# Patient Record
Sex: Male | Born: 1960 | Race: Black or African American | Hispanic: No | Smoking: Current some day smoker
Health system: Southern US, Community
[De-identification: ages and names within clinical notes are randomized; demographics above are authoritative.]

## PROBLEM LIST (undated history)

## (undated) DIAGNOSIS — I509 Heart failure, unspecified: Secondary | ICD-10-CM

## (undated) DIAGNOSIS — I420 Dilated cardiomyopathy: Secondary | ICD-10-CM

## (undated) DIAGNOSIS — G459 Transient cerebral ischemic attack, unspecified: Secondary | ICD-10-CM

---

## 1997-01-28 HISTORY — PX: CARDIAC CATHETERIZATION: SHX172

## 1997-10-01 ENCOUNTER — Inpatient Hospital Stay (HOSPITAL_COMMUNITY): Admission: EM | Admit: 1997-10-01 | Discharge: 1997-10-11 | Payer: Self-pay | Admitting: Emergency Medicine

## 1997-10-01 ENCOUNTER — Encounter: Payer: Self-pay | Admitting: *Deleted

## 1997-10-06 ENCOUNTER — Encounter: Payer: Self-pay | Admitting: Critical Care Medicine

## 1997-10-07 ENCOUNTER — Encounter: Payer: Self-pay | Admitting: Emergency Medicine

## 1998-01-04 ENCOUNTER — Emergency Department (HOSPITAL_COMMUNITY): Admission: EM | Admit: 1998-01-04 | Discharge: 1998-01-04 | Payer: Self-pay | Admitting: Emergency Medicine

## 1998-01-04 ENCOUNTER — Encounter: Payer: Self-pay | Admitting: Emergency Medicine

## 1999-09-06 ENCOUNTER — Emergency Department (HOSPITAL_COMMUNITY): Admission: EM | Admit: 1999-09-06 | Discharge: 1999-09-06 | Payer: Self-pay | Admitting: Emergency Medicine

## 2002-03-30 ENCOUNTER — Emergency Department (HOSPITAL_COMMUNITY): Admission: EM | Admit: 2002-03-30 | Discharge: 2002-03-30 | Payer: Self-pay

## 2002-07-14 ENCOUNTER — Ambulatory Visit (HOSPITAL_COMMUNITY): Admission: RE | Admit: 2002-07-14 | Discharge: 2002-07-14 | Payer: Self-pay | Admitting: Cardiology

## 2002-07-14 ENCOUNTER — Encounter (INDEPENDENT_AMBULATORY_CARE_PROVIDER_SITE_OTHER): Payer: Self-pay | Admitting: Cardiology

## 2003-02-10 ENCOUNTER — Encounter: Admission: RE | Admit: 2003-02-10 | Discharge: 2003-02-10 | Payer: Self-pay | Admitting: Cardiology

## 2008-10-19 ENCOUNTER — Ambulatory Visit: Payer: Self-pay | Admitting: Cardiology

## 2008-10-19 ENCOUNTER — Ambulatory Visit (HOSPITAL_COMMUNITY): Admission: RE | Admit: 2008-10-19 | Discharge: 2008-10-19 | Payer: Self-pay | Admitting: Cardiology

## 2010-02-19 ENCOUNTER — Encounter: Payer: Self-pay | Admitting: Cardiology

## 2013-09-06 ENCOUNTER — Encounter (HOSPITAL_COMMUNITY): Payer: Self-pay | Admitting: Emergency Medicine

## 2013-09-06 ENCOUNTER — Emergency Department (HOSPITAL_COMMUNITY): Payer: Medicare Other

## 2013-09-06 ENCOUNTER — Inpatient Hospital Stay (HOSPITAL_COMMUNITY)
Admission: EM | Admit: 2013-09-06 | Discharge: 2013-09-17 | DRG: 853 | Disposition: A | Discharge status: Home / Self Care | Payer: Medicare Other | Attending: Internal Medicine | Admitting: Internal Medicine

## 2013-09-06 DIAGNOSIS — J869 Pyothorax without fistula: Secondary | ICD-10-CM | POA: Diagnosis present

## 2013-09-06 DIAGNOSIS — N170 Acute kidney failure with tubular necrosis: Secondary | ICD-10-CM | POA: Diagnosis not present

## 2013-09-06 DIAGNOSIS — I498 Other specified cardiac arrhythmias: Secondary | ICD-10-CM | POA: Diagnosis not present

## 2013-09-06 DIAGNOSIS — R7401 Elevation of levels of liver transaminase levels: Secondary | ICD-10-CM | POA: Diagnosis not present

## 2013-09-06 DIAGNOSIS — J852 Abscess of lung without pneumonia: Secondary | ICD-10-CM

## 2013-09-06 DIAGNOSIS — A419 Sepsis, unspecified organism: Secondary | ICD-10-CM | POA: Diagnosis not present

## 2013-09-06 DIAGNOSIS — E876 Hypokalemia: Secondary | ICD-10-CM | POA: Diagnosis present

## 2013-09-06 DIAGNOSIS — Z8673 Personal history of transient ischemic attack (TIA), and cerebral infarction without residual deficits: Secondary | ICD-10-CM

## 2013-09-06 DIAGNOSIS — Z79899 Other long term (current) drug therapy: Secondary | ICD-10-CM

## 2013-09-06 DIAGNOSIS — F17200 Nicotine dependence, unspecified, uncomplicated: Secondary | ICD-10-CM | POA: Diagnosis present

## 2013-09-06 DIAGNOSIS — R7402 Elevation of levels of lactic acid dehydrogenase (LDH): Secondary | ICD-10-CM | POA: Diagnosis not present

## 2013-09-06 DIAGNOSIS — R6520 Severe sepsis without septic shock: Secondary | ICD-10-CM

## 2013-09-06 DIAGNOSIS — E875 Hyperkalemia: Secondary | ICD-10-CM | POA: Diagnosis not present

## 2013-09-06 DIAGNOSIS — I509 Heart failure, unspecified: Secondary | ICD-10-CM | POA: Diagnosis present

## 2013-09-06 DIAGNOSIS — I428 Other cardiomyopathies: Secondary | ICD-10-CM | POA: Diagnosis present

## 2013-09-06 DIAGNOSIS — R740 Nonspecific elevation of levels of transaminase and lactic acid dehydrogenase [LDH]: Secondary | ICD-10-CM

## 2013-09-06 DIAGNOSIS — R6883 Chills (without fever): Secondary | ICD-10-CM | POA: Diagnosis not present

## 2013-09-06 DIAGNOSIS — D473 Essential (hemorrhagic) thrombocythemia: Secondary | ICD-10-CM | POA: Diagnosis not present

## 2013-09-06 DIAGNOSIS — K5900 Constipation, unspecified: Secondary | ICD-10-CM | POA: Diagnosis not present

## 2013-09-06 DIAGNOSIS — I5040 Unspecified combined systolic (congestive) and diastolic (congestive) heart failure: Secondary | ICD-10-CM | POA: Diagnosis present

## 2013-09-06 DIAGNOSIS — I119 Hypertensive heart disease without heart failure: Secondary | ICD-10-CM

## 2013-09-06 DIAGNOSIS — I110 Hypertensive heart disease with heart failure: Secondary | ICD-10-CM | POA: Diagnosis present

## 2013-09-06 DIAGNOSIS — R6521 Severe sepsis with septic shock: Secondary | ICD-10-CM

## 2013-09-06 DIAGNOSIS — I420 Dilated cardiomyopathy: Secondary | ICD-10-CM | POA: Diagnosis present

## 2013-09-06 DIAGNOSIS — B954 Other streptococcus as the cause of diseases classified elsewhere: Secondary | ICD-10-CM | POA: Diagnosis present

## 2013-09-06 DIAGNOSIS — K922 Gastrointestinal hemorrhage, unspecified: Secondary | ICD-10-CM

## 2013-09-06 DIAGNOSIS — I5042 Chronic combined systolic (congestive) and diastolic (congestive) heart failure: Secondary | ICD-10-CM

## 2013-09-06 DIAGNOSIS — R6510 Systemic inflammatory response syndrome (SIRS) of non-infectious origin without acute organ dysfunction: Secondary | ICD-10-CM

## 2013-09-06 DIAGNOSIS — I10 Essential (primary) hypertension: Secondary | ICD-10-CM

## 2013-09-06 DIAGNOSIS — K921 Melena: Secondary | ICD-10-CM | POA: Diagnosis not present

## 2013-09-06 DIAGNOSIS — J9600 Acute respiratory failure, unspecified whether with hypoxia or hypercapnia: Secondary | ICD-10-CM | POA: Diagnosis present

## 2013-09-06 DIAGNOSIS — J9601 Acute respiratory failure with hypoxia: Secondary | ICD-10-CM

## 2013-09-06 HISTORY — DX: Heart failure, unspecified: I50.9

## 2013-09-06 HISTORY — DX: Transient cerebral ischemic attack, unspecified: G45.9

## 2013-09-06 HISTORY — DX: Dilated cardiomyopathy: I42.0

## 2013-09-06 LAB — CBC
HCT: 31.4 % — ABNORMAL LOW (ref 39.0–52.0)
HEMOGLOBIN: 11 g/dL — AB (ref 13.0–17.0)
MCH: 33.4 pg (ref 26.0–34.0)
MCHC: 35 g/dL (ref 30.0–36.0)
MCV: 95.4 fL (ref 78.0–100.0)
Platelets: 458 10*3/uL — ABNORMAL HIGH (ref 150–400)
RBC: 3.29 MIL/uL — AB (ref 4.22–5.81)
RDW: 13.8 % (ref 11.5–15.5)
WBC: 33.2 10*3/uL — ABNORMAL HIGH (ref 4.0–10.5)

## 2013-09-06 LAB — BASIC METABOLIC PANEL
Anion gap: 18 — ABNORMAL HIGH (ref 5–15)
BUN: 10 mg/dL (ref 6–23)
CALCIUM: 8.4 mg/dL (ref 8.4–10.5)
CO2: 21 meq/L (ref 19–32)
CREATININE: 0.92 mg/dL (ref 0.50–1.35)
Chloride: 98 mEq/L (ref 96–112)
GFR calc Af Amer: >90 mL/min (ref >90)
GFR calc non Af Amer: >90 mL/min (ref >90)
GLUCOSE: 118 mg/dL — AB (ref 70–99)
Potassium: 3.3 mEq/L — ABNORMAL LOW (ref 3.7–5.3)
Sodium: 137 mEq/L (ref 137–147)

## 2013-09-06 LAB — I-STAT TROPONIN, ED: Troponin i, poc: 0 ng/mL (ref 0.00–0.08)

## 2013-09-06 LAB — HEPATIC FUNCTION PANEL
ALBUMIN: 2.2 g/dL — AB (ref 3.5–5.2)
ALK PHOS: 139 U/L — AB (ref 39–117)
ALT: 28 U/L (ref 0–53)
AST: 61 U/L — AB (ref 0–37)
Bilirubin, Direct: 0.4 mg/dL — ABNORMAL HIGH (ref 0.0–0.3)
Indirect Bilirubin: 0.8 mg/dL (ref 0.3–0.9)
TOTAL PROTEIN: 7.3 g/dL (ref 6.0–8.3)
Total Bilirubin: 1.2 mg/dL (ref 0.3–1.2)

## 2013-09-06 LAB — PRO B NATRIURETIC PEPTIDE: Pro B Natriuretic peptide (BNP): 982.1 pg/mL — ABNORMAL HIGH (ref 0–125)

## 2013-09-06 LAB — I-STAT CG4 LACTIC ACID, ED: Lactic Acid, Venous: 1.82 mmol/L (ref 0.5–2.2)

## 2013-09-06 MED ORDER — IOHEXOL 350 MG/ML SOLN
80.0000 mL | Freq: Once | INTRAVENOUS | Status: AC | PRN
  Administered 2013-09-06: 80 mL via INTRAVENOUS

## 2013-09-06 MED ORDER — SODIUM CHLORIDE 0.9 % IV SOLN
1000.0000 mL | INTRAVENOUS | Status: DC
  Administered 2013-09-06 (×2): 1000 mL via INTRAVENOUS

## 2013-09-06 MED ORDER — ALBUTEROL SULFATE (2.5 MG/3ML) 0.083% IN NEBU
5.0000 mg | INHALATION_SOLUTION | Freq: Once | RESPIRATORY_TRACT | Status: AC
  Administered 2013-09-06: 5 mg via RESPIRATORY_TRACT
  Filled 2013-09-06: qty 6

## 2013-09-06 MED ORDER — SODIUM CHLORIDE 0.9 % IV BOLUS (SEPSIS)
30.0000 mL/kg | Freq: Once | INTRAVENOUS | Status: AC
  Administered 2013-09-06: 1000 mL via INTRAVENOUS

## 2013-09-06 MED ORDER — DEXTROSE 5 % IV SOLN
1.0000 g | Freq: Once | INTRAVENOUS | Status: AC
  Administered 2013-09-06: 1 g via INTRAVENOUS
  Filled 2013-09-06: qty 10

## 2013-09-06 MED ORDER — DEXTROSE 5 % IV SOLN
500.0000 mg | Freq: Once | INTRAVENOUS | Status: AC
  Administered 2013-09-06: 500 mg via INTRAVENOUS
  Filled 2013-09-06: qty 500

## 2013-09-06 MED ORDER — DEXTROSE 5 % IV SOLN: 500.0000 mg | INTRAVENOUS | Status: DC

## 2013-09-06 MED ORDER — DEXTROSE 5 % IV SOLN: 1.0000 g | INTRAVENOUS | Status: DC

## 2013-09-06 NOTE — ED Notes (Signed)
Attempted IV x2. Unable to gain access.

## 2013-09-06 NOTE — ED Notes (Signed)
Eliezer Lofts, RN at the bedside attempting to place IV.

## 2013-09-06 NOTE — ED Provider Notes (Signed)
Date: 09/06/2013  Rate: 120  Rhythm: sinus tachycardia  QRS Axis: normal  Intervals: normal  ST/T Wave abnormalities: nonspecific T wave changes  Conduction Disutrbances:none  Narrative Interpretation:   Old EKG Reviewed: none available   Threasa Beards, MD 09/06/13 2243

## 2013-09-06 NOTE — ED Notes (Signed)
IV and Phlebotomy Team at the bedside.

## 2013-09-06 NOTE — ED Notes (Signed)
PA at the bedside.

## 2013-09-06 NOTE — ED Notes (Signed)
Patient returned from CT

## 2013-09-06 NOTE — ED Notes (Signed)
Jenna Attempted Iv x2. Unable to access. IV Team paged.

## 2013-09-06 NOTE — ED Provider Notes (Signed)
CSN: [id number]     Arrival date & time 09/06/13  1949 History   First MD Initiated Contact with Patient 09/06/13 2013     Chief Complaint  Patient presents with  . Chest Pain  . Shortness of Breath     (Consider location/radiation/quality/duration/timing/severity/associated sxs/prior Treatment) The history is provided by the patient.    Patient presents with SOB, hemoptysis, right sided chest pain.  States he was carrying a heavy car battery last week and the next morning developed muscle spasms in his right chest and right abdomen with associated SOB.  Has occasional cough productive of blood and mucus. Had cold shaking chills once last week.  Denies recent illness, recent antibiotics, recent immobilization, leg swelling.  He does not smoke.  No hx lung disease.  Denies known fevers, N/V/D, leg swelling.  Has abdominal pain/tenderness only with coughing.   Past Medical History  Diagnosis Date  . TIA (transient ischemic attack)    History reviewed. No pertinent past surgical history. Family History  Problem Relation Age of Onset  . Dementia Mother   . Diabetes Brother    History  Substance Use Topics  . Smoking status: Current Some Day Smoker -- 0.10 packs/day    Types: Cigarettes  . Smokeless tobacco: Never Used  . Alcohol Use: No    Review of Systems  All other systems reviewed and are negative.     Allergies  Review of patient's allergies indicates no known allergies.  Home Medications   Prior to Admission medications   Not on File   BP 107/59  Pulse 121  Temp(Src) 99.9 F (37.7 C) (Oral)  Resp 34  Ht 6' (1.829 m)  Wt 215 lb (97.523 kg)  BMI 29.15 kg/m2  SpO2 95% Physical Exam  Nursing note and vitals reviewed. Constitutional: He appears well-developed and well-nourished. No distress.  HENT:  Head: Normocephalic and atraumatic.  Neck: Neck supple.  Cardiovascular: Normal rate and regular rhythm.   Pulmonary/Chest: Accessory muscle usage present.  Tachypnea noted. No respiratory distress. He has decreased breath sounds in the right lower field. He has no wheezes. He has no rhonchi. He has no rales.  Abdominal: Soft. He exhibits no distension. There is tenderness. There is no rebound and no guarding.  Musculoskeletal: He exhibits no edema.  Neurological: He is alert. He exhibits normal muscle tone.  Skin: He is not diaphoretic.    ED Course  Procedures (including critical care time) Labs Review Labs Reviewed  CBC - Abnormal; Notable for the following:    WBC 33.2 (*)    RBC 3.29 (*)    Hemoglobin 11.0 (*)    HCT 31.4 (*)    Platelets 458 (*)    All other components within normal limits  BASIC METABOLIC PANEL - Abnormal; Notable for the following:    Potassium 3.3 (*)    Glucose, Bld 118 (*)    Anion gap 18 (*)    All other components within normal limits  PRO B NATRIURETIC PEPTIDE - Abnormal; Notable for the following:    Pro B Natriuretic peptide (BNP) 982.1 (*)    All other components within normal limits  HEPATIC FUNCTION PANEL - Abnormal; Notable for the following:    Albumin 2.2 (*)    AST 61 (*)    Alkaline Phosphatase 139 (*)    Bilirubin, Direct 0.4 (*)    All other components within normal limits  CULTURE, BLOOD (ROUTINE X 2)  CULTURE, BLOOD (ROUTINE X 2)  URINE CULTURE  URINALYSIS, ROUTINE W REFLEX MICROSCOPIC  I-STAT TROPOININ, ED  I-STAT CG4 LACTIC ACID, ED    Imaging Review Ct Angio Chest Pe W/cm &/or Wo Cm  09/06/2013   CLINICAL DATA:  Chest pain after lifting injury.  New onset cough.  EXAM: CT ANGIOGRAPHY CHEST WITH CONTRAST  TECHNIQUE: Multidetector CT imaging of the chest was performed using the standard protocol during bolus administration of intravenous contrast. Multiplanar CT image reconstructions and MIPs were obtained to evaluate the vascular anatomy.  CONTRAST:  57mL OMNIPAQUE IOHEXOL 350 MG/ML SOLN  COMPARISON:  Chest radiograph September 06, 2013  FINDINGS: Adequate pulmonary arterial contrast  opacification. No pulmonary arterial filling defects to the level of the subsegmental branches.  At least 11 x 12 cm right lower lobe air-fluid level, with thick rind of low-density presumed necrotic tissue, appearing extrapleural with enhancing atelectasis anteriorly, surrounding ground-glass nodules and loculated component. Low-density component along the anterior pleural surface of the right upper lobe. Small left pleural effusion.  12 mm short axis pretracheal, 14 mm short axis subcarinal lymph node. Inflammatory changes of the cardiophrenic angle with probable lymphadenopathy. Tracheobronchial tree is patent and midline. No pneumothorax.  Right lung process mildly displaces the heart to the left. Pericardium is nonsuspicious. Thoracic aorta is normal in course and caliber with mild calcific atherosclerosis.  Included view of the abdomen is unremarkable. Soft tissues are nonsuspicious. No acute osseous process.  Review of the MIP images confirms the above findings.  IMPRESSION: No acute pulmonary embolism.  At least 11 x 12 mm right lung base probable abscess with air-fluid level (which can also be seen with bronchopleural fistula), in addition to large right loculated pleural effusion/empyema. Associated ground-glass opacities are probably infectious. Mediastinal lymphadenopathy is likely reactive. Right lung process results in mild mediastinal shift to the left.  Small left pleural effusion.  Findings discussed with and reconfirmed by Dr.Ivaan Liddy on8/10/2015at11:09 pm.   Electronically Signed   By: Elon Alas   On: 09/06/2013 23:13   Dg Chest Port 1 View  09/06/2013   CLINICAL DATA:  53 year old male with shortness of breath and respiratory distress  EXAM: PORTABLE CHEST - 1 VIEW  COMPARISON:  01/04/1998 chest radiograph  FINDINGS: Pulmonary vascular congestion is noted.  Opacities overlying both lower lungs, right greater than left, probably represents effusion and atelectasis/airspace disease.  A  3 x 4 cm oval structure overlying the right lower lung could represent a focal mass.  No acute bony abnormalities are noted.  There is no evidence of pneumothorax.  IMPRESSION: Bilateral lower lung opacities, right greater than left, likely representing pleural effusions and atelectasis/airspace disease.  3 x 4 cm oval structure overlying the right lower lung could represent focal mass. Radiographic follow-up recommended.  Pulmonary vascular congestion.   Electronically Signed   By: Hassan Rowan M.D.   On: 09/06/2013 20:48     EKG Interpretation None      8:32 PM Dr Canary Brim made aware of the patient.    11:21 PM Dr Roxan Hockey will consult.  Requests medicine admission.    11:34 PM Discussed pt with [facility] Dr Ernestina Patches who will see the patient.    Dr Ernestina Patches requests call to Critical Care for admission.    12:02 AM Discussed pt Intensivist Dr Deterding who will see and evaluate the patient in the ED.    CRITICAL CARE Performed by: Clayton Bibles   Total critical care time: 30  Critical care time was exclusive of separately billable procedures and treating  other patients.  Critical care was necessary to treat or prevent imminent or life-threatening deterioration.  Critical care was time spent personally by me on the following activities: development of treatment plan with patient and/or surrogate as well as nursing, discussions with consultants, evaluation of patient's response to treatment, examination of patient, obtaining history from patient or surrogate, ordering and performing treatments and interventions, ordering and review of laboratory studies, ordering and review of radiographic studies, pulse oximetry and re-evaluation of patient's condition.   MDM   Final diagnoses:  Lung abscess  Empyema lung  SIRS (systemic inflammatory response syndrome)  Acute respiratory failure with hypoxia    Pt with temp 99.9 in in respiratory distress presents with SOB, right chest pain,  hemoptysis, found to have decreased lung sounds at right base. ED Sepsis orders placed with abx for CAP.  CXR abnormal.  CT shows lung abscess and empyema.  WBC 33, Hgb 11.  BNP 982, pulmonary vascular congestion present.   IVF, abx per initial sepsis orders for CAP.  Consult to cardiothoracic surgery Dr Roxan Hockey who will consult, requests medication admission.  Placed call to hospitalist Dr Ernestina Patches who saw the patient and was concerned that patient may be too sick for step down.  I have called Intensivist who will see the patient and evaluate.    12:44 AM Critical Care PA has seen patient and has communicated with attending.  They will consult.  Dr Ernestina Patches has placed orders for admission.     [place], PA-C 09/07/13 [phone]

## 2013-09-06 NOTE — ED Notes (Signed)
Per EMS, Patient was removing a car battery about a week ago and the next day he woke up with pain in his right chest radiating down the right arm. Patient states the pain worsens with inspiration, palpation, and coughing. The Chest pain and shortness of breath has become worse over the last week. Patient reports having a cough that started about a week ago as well. Patient is tachypnic with movement, but when he sits still he is able to slow his breathing. Vitals per EMS: 140/68, 120 HR, 96% on RA, 28 RR. EKG Unremarkable.

## 2013-09-06 NOTE — Progress Notes (Signed)
ANTIBIOTIC CONSULT NOTE - FOLLOW UP  Pharmacy Consult for rocephin/azithromycin Indication: CAP  No Known Allergies  Patient Measurements: Height: 6' (182.9 cm) Weight: 215 lb (97.523 kg) IBW/kg (Calculated) : 77.6   Vital Signs: Temp: 99.9 F (37.7 C) (08/10 2000) Temp src: Oral (08/10 2000) BP: 106/58 mmHg (08/10 2030) Pulse Rate: 117 (08/10 2030) Intake/Output from previous day:   Intake/Output from this shift:    Labs: No results found for this basename: WBC, HGB, PLT, LABCREA, CREATININE,  in the last 72 hours CrCl is unknown because no creatinine reading has been taken. No results found for this basename: VANCOTROUGH, VANCOPEAK, VANCORANDOM, GENTTROUGH, GENTPEAK, GENTRANDOM, TOBRATROUGH, TOBRAPEAK, TOBRARND, AMIKACINPEAK, AMIKACINTROU, AMIKACIN,  in the last 72 hours   Microbiology: No results found for this or any previous visit (from the past 720 hour(s)).  Anti-infectives   Start     Dose/Rate Route Frequency Ordered Stop   09/06/13 2045  cefTRIAXone (ROCEPHIN) 1 g in dextrose 5 % 50 mL IVPB     1 g 100 mL/hr over 30 Minutes Intravenous  Once 09/06/13 2038     09/06/13 2045  azithromycin (ZITHROMAX) 500 mg in dextrose 5 % 250 mL IVPB     500 mg 250 mL/hr over 60 Minutes Intravenous  Once 09/06/13 2038        Assessment: 53 yo male with possible CAP (CXR with bilateral lower lung opacities) to start rocephin and azithromycin. First doses have been ordered in the ED.  8/10 rocephin 8/10 azithromycin  8/10 blood 8/10 urine   Plan:  -Rocephin 1gm IV q24h -Azithromycin 500mg  IV q24h -Will follow renal function, cultures and clinical progress  Hildred Laser, Pharm D 09/06/2013 8:58 PM

## 2013-09-07 ENCOUNTER — Encounter (HOSPITAL_COMMUNITY): Payer: Self-pay | Admitting: Certified Registered"

## 2013-09-07 ENCOUNTER — Inpatient Hospital Stay (HOSPITAL_COMMUNITY): Payer: Medicare Other | Admitting: Certified Registered"

## 2013-09-07 ENCOUNTER — Encounter (HOSPITAL_COMMUNITY)
Admission: EM | Disposition: A | Discharge status: Home / Self Care | Payer: Self-pay | Source: Home / Self Care | Attending: Pulmonary Disease

## 2013-09-07 ENCOUNTER — Inpatient Hospital Stay (HOSPITAL_COMMUNITY): Payer: Medicare Other

## 2013-09-07 ENCOUNTER — Encounter (HOSPITAL_COMMUNITY): Payer: Medicare Other | Admitting: Certified Registered"

## 2013-09-07 DIAGNOSIS — I509 Heart failure, unspecified: Secondary | ICD-10-CM

## 2013-09-07 DIAGNOSIS — I428 Other cardiomyopathies: Secondary | ICD-10-CM

## 2013-09-07 DIAGNOSIS — I498 Other specified cardiac arrhythmias: Secondary | ICD-10-CM | POA: Diagnosis not present

## 2013-09-07 DIAGNOSIS — F17200 Nicotine dependence, unspecified, uncomplicated: Secondary | ICD-10-CM | POA: Diagnosis present

## 2013-09-07 DIAGNOSIS — D473 Essential (hemorrhagic) thrombocythemia: Secondary | ICD-10-CM | POA: Diagnosis not present

## 2013-09-07 DIAGNOSIS — J9600 Acute respiratory failure, unspecified whether with hypoxia or hypercapnia: Secondary | ICD-10-CM | POA: Diagnosis present

## 2013-09-07 DIAGNOSIS — I119 Hypertensive heart disease without heart failure: Secondary | ICD-10-CM

## 2013-09-07 DIAGNOSIS — K921 Melena: Secondary | ICD-10-CM | POA: Diagnosis not present

## 2013-09-07 DIAGNOSIS — B954 Other streptococcus as the cause of diseases classified elsewhere: Secondary | ICD-10-CM | POA: Diagnosis present

## 2013-09-07 DIAGNOSIS — I420 Dilated cardiomyopathy: Secondary | ICD-10-CM | POA: Diagnosis present

## 2013-09-07 DIAGNOSIS — Z8673 Personal history of transient ischemic attack (TIA), and cerebral infarction without residual deficits: Secondary | ICD-10-CM | POA: Diagnosis not present

## 2013-09-07 DIAGNOSIS — R6883 Chills (without fever): Secondary | ICD-10-CM | POA: Diagnosis present

## 2013-09-07 DIAGNOSIS — I5040 Unspecified combined systolic (congestive) and diastolic (congestive) heart failure: Secondary | ICD-10-CM | POA: Diagnosis present

## 2013-09-07 DIAGNOSIS — E876 Hypokalemia: Secondary | ICD-10-CM | POA: Diagnosis present

## 2013-09-07 DIAGNOSIS — J869 Pyothorax without fistula: Secondary | ICD-10-CM | POA: Diagnosis present

## 2013-09-07 DIAGNOSIS — N170 Acute kidney failure with tubular necrosis: Secondary | ICD-10-CM | POA: Diagnosis not present

## 2013-09-07 DIAGNOSIS — I110 Hypertensive heart disease with heart failure: Secondary | ICD-10-CM

## 2013-09-07 DIAGNOSIS — R7402 Elevation of levels of lactic acid dehydrogenase (LDH): Secondary | ICD-10-CM | POA: Diagnosis not present

## 2013-09-07 DIAGNOSIS — A419 Sepsis, unspecified organism: Secondary | ICD-10-CM | POA: Diagnosis present

## 2013-09-07 DIAGNOSIS — I517 Cardiomegaly: Secondary | ICD-10-CM

## 2013-09-07 DIAGNOSIS — J852 Abscess of lung without pneumonia: Secondary | ICD-10-CM

## 2013-09-07 DIAGNOSIS — R6521 Severe sepsis with septic shock: Secondary | ICD-10-CM | POA: Diagnosis not present

## 2013-09-07 DIAGNOSIS — R6520 Severe sepsis without septic shock: Secondary | ICD-10-CM | POA: Diagnosis present

## 2013-09-07 DIAGNOSIS — E875 Hyperkalemia: Secondary | ICD-10-CM | POA: Diagnosis not present

## 2013-09-07 DIAGNOSIS — K5900 Constipation, unspecified: Secondary | ICD-10-CM | POA: Diagnosis not present

## 2013-09-07 DIAGNOSIS — Z79899 Other long term (current) drug therapy: Secondary | ICD-10-CM | POA: Diagnosis not present

## 2013-09-07 HISTORY — PX: VIDEO ASSISTED THORACOSCOPY (VATS)/DECORTICATION: SHX6171

## 2013-09-07 LAB — PROTIME-INR
INR: 1.3 (ref 0.00–1.49)
Prothrombin Time: 16.2 seconds — ABNORMAL HIGH (ref 11.6–15.2)

## 2013-09-07 LAB — URINALYSIS, ROUTINE W REFLEX MICROSCOPIC
GLUCOSE, UA: NEGATIVE mg/dL
Hgb urine dipstick: NEGATIVE
Ketones, ur: NEGATIVE mg/dL
LEUKOCYTES UA: NEGATIVE
Nitrite: POSITIVE — AB
PH: 6 (ref 5.0–8.0)
PROTEIN: 30 mg/dL — AB
Urobilinogen, UA: >8 mg/dL — ABNORMAL HIGH (ref 0.0–1.0)

## 2013-09-07 LAB — CBC
HCT: 30.9 % — ABNORMAL LOW (ref 39.0–52.0)
Hemoglobin: 10.7 g/dL — ABNORMAL LOW (ref 13.0–17.0)
MCH: 33.9 pg (ref 26.0–34.0)
MCHC: 34.6 g/dL (ref 30.0–36.0)
MCV: 97.8 fL (ref 78.0–100.0)
PLATELETS: 420 10*3/uL — AB (ref 150–400)
RBC: 3.16 MIL/uL — AB (ref 4.22–5.81)
RDW: 13.9 % (ref 11.5–15.5)
WBC: 31.7 10*3/uL — ABNORMAL HIGH (ref 4.0–10.5)

## 2013-09-07 LAB — URINE MICROSCOPIC-ADD ON

## 2013-09-07 LAB — COMPREHENSIVE METABOLIC PANEL
ALT: 26 U/L (ref 0–53)
ALT: 26 U/L (ref 0–53)
AST: 51 U/L — ABNORMAL HIGH (ref 0–37)
AST: 55 U/L — ABNORMAL HIGH (ref 0–37)
Albumin: 1.9 g/dL — ABNORMAL LOW (ref 3.5–5.2)
Albumin: 1.9 g/dL — ABNORMAL LOW (ref 3.5–5.2)
Alkaline Phosphatase: 118 U/L — ABNORMAL HIGH (ref 39–117)
Alkaline Phosphatase: 118 U/L — ABNORMAL HIGH (ref 39–117)
Anion gap: 14 (ref 5–15)
Anion gap: 15 (ref 5–15)
BUN: 10 mg/dL (ref 6–23)
BUN: 9 mg/dL (ref 6–23)
CO2: 20 mEq/L (ref 19–32)
CO2: 20 meq/L (ref 19–32)
CREATININE: 0.85 mg/dL (ref 0.50–1.35)
Calcium: 7.8 mg/dL — ABNORMAL LOW (ref 8.4–10.5)
Calcium: 8.2 mg/dL — ABNORMAL LOW (ref 8.4–10.5)
Chloride: 102 mEq/L (ref 96–112)
Chloride: 104 mEq/L (ref 96–112)
Creatinine, Ser: 0.9 mg/dL (ref 0.50–1.35)
GFR calc Af Amer: >90 mL/min (ref >90)
GFR calc non Af Amer: >90 mL/min (ref >90)
Glucose, Bld: 155 mg/dL — ABNORMAL HIGH (ref 70–99)
Glucose, Bld: 113 mg/dL — ABNORMAL HIGH (ref 70–99)
Potassium: 3.3 mEq/L — ABNORMAL LOW (ref 3.7–5.3)
Potassium: 3.5 mEq/L — ABNORMAL LOW (ref 3.7–5.3)
Sodium: 136 mEq/L — ABNORMAL LOW (ref 137–147)
Sodium: 139 mEq/L (ref 137–147)
TOTAL PROTEIN: 6.8 g/dL (ref 6.0–8.3)
Total Bilirubin: 0.9 mg/dL (ref 0.3–1.2)
Total Bilirubin: 0.8 mg/dL (ref 0.3–1.2)
Total Protein: 6.5 g/dL (ref 6.0–8.3)

## 2013-09-07 LAB — CBC WITH DIFFERENTIAL/PLATELET
Basophils Absolute: 0 10*3/uL (ref 0.0–0.1)
Basophils Relative: 0 % (ref 0–1)
Eosinophils Absolute: 0 10*3/uL (ref 0.0–0.7)
Eosinophils Relative: 0 % (ref 0–5)
HCT: 28.3 % — ABNORMAL LOW (ref 39.0–52.0)
Hemoglobin: 9.7 g/dL — ABNORMAL LOW (ref 13.0–17.0)
Lymphocytes Relative: 10 % — ABNORMAL LOW (ref 12–46)
Lymphs Abs: 3.3 10*3/uL (ref 0.7–4.0)
MCH: 32.8 pg (ref 26.0–34.0)
MCHC: 34.3 g/dL (ref 30.0–36.0)
MCV: 95.6 fL (ref 78.0–100.0)
Monocytes Absolute: 1.6 10*3/uL — ABNORMAL HIGH (ref 0.1–1.0)
Monocytes Relative: 5 % (ref 3–12)
Neutro Abs: 27.8 10*3/uL — ABNORMAL HIGH (ref 1.7–7.7)
Neutrophils Relative %: 85 % — ABNORMAL HIGH (ref 43–77)
Platelets: 416 10*3/uL — ABNORMAL HIGH (ref 150–400)
RBC: 2.96 MIL/uL — ABNORMAL LOW (ref 4.22–5.81)
RDW: 14 % (ref 11.5–15.5)
WBC: 32.7 10*3/uL — ABNORMAL HIGH (ref 4.0–10.5)

## 2013-09-07 LAB — MRSA PCR SCREENING: MRSA by PCR: NEGATIVE

## 2013-09-07 LAB — APTT: aPTT: 37 seconds (ref 24–37)

## 2013-09-07 LAB — TYPE AND SCREEN
ABO/RH(D): O POS
Antibody Screen: NEGATIVE

## 2013-09-07 LAB — TROPONIN I
Troponin I: <0.3 ng/mL (ref <0.30)
Troponin I: <0.3 ng/mL (ref <0.30)

## 2013-09-07 LAB — ABO/RH: ABO/RH(D): O POS

## 2013-09-07 LAB — MAGNESIUM: Magnesium: 2 mg/dL (ref 1.5–2.5)

## 2013-09-07 SURGERY — VIDEO ASSISTED THORACOSCOPY (VATS)/DECORTICATION
Anesthesia: General | Site: Chest | Laterality: Right

## 2013-09-07 MED ORDER — LIDOCAINE HCL (CARDIAC) 20 MG/ML IV SOLN
INTRAVENOUS | Status: AC
  Filled 2013-09-07: qty 5

## 2013-09-07 MED ORDER — SODIUM CHLORIDE 0.9 % IJ SOLN
INTRAMUSCULAR | Status: AC
  Filled 2013-09-07: qty 10

## 2013-09-07 MED ORDER — IPRATROPIUM BROMIDE 0.02 % IN SOLN: 0.5000 mg | RESPIRATORY_TRACT | Status: DC

## 2013-09-07 MED ORDER — DEXMEDETOMIDINE HCL IN NACL 200 MCG/50ML IV SOLN
INTRAVENOUS | Status: AC
  Filled 2013-09-07: qty 50

## 2013-09-07 MED ORDER — OXYCODONE HCL 5 MG PO TABS: 5.0000 mg | ORAL_TABLET | Freq: Once | ORAL | Status: DC | PRN

## 2013-09-07 MED ORDER — PANTOPRAZOLE SODIUM 40 MG IV SOLR
40.0000 mg | INTRAVENOUS | Status: DC
  Administered 2013-09-07 – 2013-09-11 (×5): 40 mg via INTRAVENOUS
  Filled 2013-09-07 (×8): qty 40

## 2013-09-07 MED ORDER — POTASSIUM CHLORIDE IN NACL 20-0.9 MEQ/L-% IV SOLN
INTRAVENOUS | Status: DC
  Administered 2013-09-07: 1 mL via INTRAVENOUS
  Filled 2013-09-07 (×9): qty 1000

## 2013-09-07 MED ORDER — AMLODIPINE BESYLATE 5 MG PO TABS
5.0000 mg | ORAL_TABLET | Freq: Every day | ORAL | Status: DC
  Filled 2013-09-07: qty 1

## 2013-09-07 MED ORDER — MORPHINE BOLUS VIA INFUSION
2.0000 mg | INTRAVENOUS | Status: DC | PRN
  Filled 2013-09-07: qty 4

## 2013-09-07 MED ORDER — SODIUM CHLORIDE 0.9 % IV BOLUS (SEPSIS)
1000.0000 mL | Freq: Once | INTRAVENOUS | Status: AC
  Administered 2013-09-07: 1000 mL via INTRAVENOUS

## 2013-09-07 MED ORDER — CARVEDILOL 12.5 MG PO TABS
12.5000 mg | ORAL_TABLET | Freq: Two times a day (BID) | ORAL | Status: DC
  Filled 2013-09-07 (×3): qty 1

## 2013-09-07 MED ORDER — MORPHINE SULFATE 4 MG/ML IJ SOLN
4.0000 mg | Freq: Once | INTRAMUSCULAR | Status: AC
  Administered 2013-09-07: 4 mg via INTRAVENOUS

## 2013-09-07 MED ORDER — FENTANYL CITRATE 0.05 MG/ML IJ SOLN
INTRAMUSCULAR | Status: DC | PRN
  Administered 2013-09-07 (×2): 50 ug via INTRAVENOUS
  Administered 2013-09-07: 100 ug via INTRAVENOUS
  Administered 2013-09-07 (×4): 50 ug via INTRAVENOUS

## 2013-09-07 MED ORDER — PROPOFOL 10 MG/ML IV BOLUS
INTRAVENOUS | Status: DC | PRN
  Administered 2013-09-07: 150 mg via INTRAVENOUS
  Administered 2013-09-07: 30 mg via INTRAVENOUS
  Administered 2013-09-07: 20 mg via INTRAVENOUS

## 2013-09-07 MED ORDER — PIPERACILLIN-TAZOBACTAM 3.375 G IVPB
3.3750 g | Freq: Three times a day (TID) | INTRAVENOUS | Status: AC
  Administered 2013-09-07 – 2013-09-16 (×30): 3.375 g via INTRAVENOUS
  Filled 2013-09-07 (×33): qty 50

## 2013-09-07 MED ORDER — DEXMEDETOMIDINE HCL IN NACL 200 MCG/50ML IV SOLN
0.4000 ug/kg/h | INTRAVENOUS | Status: DC
  Administered 2013-09-07 (×2): 0.7 ug/kg/h via INTRAVENOUS
  Administered 2013-09-07: 1.2 ug/kg/h via INTRAVENOUS
  Administered 2013-09-07: 0.6 ug/kg/h via INTRAVENOUS
  Administered 2013-09-08 (×3): 0.7 ug/kg/h via INTRAVENOUS
  Filled 2013-09-07: qty 50
  Filled 2013-09-07 (×2): qty 100
  Filled 2013-09-07 (×2): qty 50

## 2013-09-07 MED ORDER — KCL IN DEXTROSE-NACL 20-5-0.9 MEQ/L-%-% IV SOLN
INTRAVENOUS | Status: DC
  Administered 2013-09-07 – 2013-09-10 (×6): via INTRAVENOUS
  Filled 2013-09-07 (×11): qty 1000

## 2013-09-07 MED ORDER — HYDRALAZINE HCL 25 MG PO TABS
25.0000 mg | ORAL_TABLET | Freq: Two times a day (BID) | ORAL | Status: DC
  Filled 2013-09-07 (×3): qty 1

## 2013-09-07 MED ORDER — HEPARIN SODIUM (PORCINE) 5000 UNIT/ML IJ SOLN
5000.0000 [IU] | Freq: Three times a day (TID) | INTRAMUSCULAR | Status: DC
  Administered 2013-09-07 (×2): 5000 [IU] via SUBCUTANEOUS
  Filled 2013-09-07 (×5): qty 1

## 2013-09-07 MED ORDER — FENTANYL CITRATE 0.05 MG/ML IJ SOLN
INTRAMUSCULAR | Status: AC
  Filled 2013-09-07: qty 5

## 2013-09-07 MED ORDER — ROCURONIUM BROMIDE 50 MG/5ML IV SOLN
INTRAVENOUS | Status: AC
  Filled 2013-09-07: qty 1

## 2013-09-07 MED ORDER — PHENYLEPHRINE 40 MCG/ML (10ML) SYRINGE FOR IV PUSH (FOR BLOOD PRESSURE SUPPORT)
PREFILLED_SYRINGE | INTRAVENOUS | Status: AC
  Filled 2013-09-07: qty 10

## 2013-09-07 MED ORDER — ONDANSETRON HCL 4 MG/2ML IJ SOLN
4.0000 mg | Freq: Four times a day (QID) | INTRAMUSCULAR | Status: DC | PRN
  Administered 2013-09-08: 4 mg via INTRAVENOUS
  Filled 2013-09-07: qty 2

## 2013-09-07 MED ORDER — IPRATROPIUM-ALBUTEROL 0.5-2.5 (3) MG/3ML IN SOLN
3.0000 mL | RESPIRATORY_TRACT | Status: DC
  Administered 2013-09-07 – 2013-09-10 (×17): 3 mL via RESPIRATORY_TRACT
  Filled 2013-09-07 (×18): qty 3

## 2013-09-07 MED ORDER — LACTATED RINGERS IV SOLN
INTRAVENOUS | Status: DC | PRN
  Administered 2013-09-07 (×3): via INTRAVENOUS

## 2013-09-07 MED ORDER — MIDAZOLAM HCL 5 MG/5ML IJ SOLN
INTRAMUSCULAR | Status: DC | PRN
  Administered 2013-09-07 (×6): 1 mg via INTRAVENOUS

## 2013-09-07 MED ORDER — ALBUTEROL SULFATE (2.5 MG/3ML) 0.083% IN NEBU: 2.5000 mg | INHALATION_SOLUTION | RESPIRATORY_TRACT | Status: DC

## 2013-09-07 MED ORDER — MIDAZOLAM HCL 2 MG/2ML IJ SOLN
INTRAMUSCULAR | Status: AC
  Filled 2013-09-07: qty 2

## 2013-09-07 MED ORDER — SODIUM CHLORIDE 0.9 % IJ SOLN
3.0000 mL | Freq: Two times a day (BID) | INTRAMUSCULAR | Status: DC
  Administered 2013-09-07 – 2013-09-11 (×7): 3 mL via INTRAVENOUS
  Administered 2013-09-13: 10 mL via INTRAVENOUS
  Administered 2013-09-15 – 2013-09-16 (×3): 3 mL via INTRAVENOUS

## 2013-09-07 MED ORDER — SODIUM CHLORIDE 0.9 % IV SOLN
INTRAVENOUS | Status: DC
  Administered 2013-09-07: 1 mL via INTRAVENOUS

## 2013-09-07 MED ORDER — ONDANSETRON HCL 4 MG/2ML IJ SOLN
INTRAMUSCULAR | Status: AC
  Filled 2013-09-07: qty 2

## 2013-09-07 MED ORDER — PROPOFOL 10 MG/ML IV BOLUS
INTRAVENOUS | Status: AC
  Filled 2013-09-07: qty 20

## 2013-09-07 MED ORDER — ROCURONIUM BROMIDE 100 MG/10ML IV SOLN
INTRAVENOUS | Status: DC | PRN
Start: 2013-09-07 — End: 2013-09-07
  Administered 2013-09-07: 50 mg via INTRAVENOUS
  Administered 2013-09-07: 30 mg via INTRAVENOUS

## 2013-09-07 MED ORDER — BISACODYL 5 MG PO TBEC
10.0000 mg | DELAYED_RELEASE_TABLET | Freq: Every day | ORAL | Status: DC
  Administered 2013-09-08 – 2013-09-09 (×2): 10 mg via ORAL
  Filled 2013-09-07: qty 2

## 2013-09-07 MED ORDER — SENNOSIDES-DOCUSATE SODIUM 8.6-50 MG PO TABS
1.0000 | ORAL_TABLET | Freq: Every day | ORAL | Status: DC
  Administered 2013-09-08: 1 via ORAL
  Filled 2013-09-07 (×4): qty 1

## 2013-09-07 MED ORDER — VANCOMYCIN HCL IN DEXTROSE 1-5 GM/200ML-% IV SOLN
1000.0000 mg | Freq: Three times a day (TID) | INTRAVENOUS | Status: DC
  Administered 2013-09-07 – 2013-09-08 (×5): 1000 mg via INTRAVENOUS
  Filled 2013-09-07 (×7): qty 200

## 2013-09-07 MED ORDER — CHLORHEXIDINE GLUCONATE 0.12 % MT SOLN
15.0000 mL | Freq: Two times a day (BID) | OROMUCOSAL | Status: DC
  Administered 2013-09-07 – 2013-09-09 (×4): 15 mL via OROMUCOSAL
  Filled 2013-09-07 (×4): qty 15

## 2013-09-07 MED ORDER — MORPHINE SULFATE 4 MG/ML IJ SOLN
INTRAMUSCULAR | Status: AC
  Filled 2013-09-07: qty 1

## 2013-09-07 MED ORDER — PROMETHAZINE HCL 25 MG/ML IJ SOLN: 6.2500 mg | INTRAMUSCULAR | Status: DC | PRN

## 2013-09-07 MED ORDER — VECURONIUM BROMIDE 10 MG IV SOLR
INTRAVENOUS | Status: DC | PRN
  Administered 2013-09-07: 4 mg via INTRAVENOUS
  Administered 2013-09-07: 3 mg via INTRAVENOUS
  Administered 2013-09-07: 2 mg via INTRAVENOUS
  Administered 2013-09-07: 1 mg via INTRAVENOUS

## 2013-09-07 MED ORDER — ENOXAPARIN SODIUM 40 MG/0.4ML ~~LOC~~ SOLN
40.0000 mg | Freq: Every day | SUBCUTANEOUS | Status: DC
  Administered 2013-09-08 – 2013-09-11 (×4): 40 mg via SUBCUTANEOUS
  Filled 2013-09-07 (×5): qty 0.4

## 2013-09-07 MED ORDER — OXYCODONE HCL 5 MG/5ML PO SOLN: 5.0000 mg | Freq: Once | ORAL | Status: DC | PRN

## 2013-09-07 MED ORDER — ARTIFICIAL TEARS OP OINT
TOPICAL_OINTMENT | OPHTHALMIC | Status: DC | PRN
  Administered 2013-09-07: 1 via OPHTHALMIC

## 2013-09-07 MED ORDER — EPHEDRINE SULFATE 50 MG/ML IJ SOLN
INTRAMUSCULAR | Status: AC
  Filled 2013-09-07: qty 1

## 2013-09-07 MED ORDER — NOREPINEPHRINE BITARTRATE 1 MG/ML IV SOLN
2.0000 ug/min | INTRAVENOUS | Status: DC
  Administered 2013-09-07 – 2013-09-08 (×2): 2 ug/min via INTRAVENOUS
  Filled 2013-09-07 (×2): qty 4

## 2013-09-07 MED ORDER — CETYLPYRIDINIUM CHLORIDE 0.05 % MT LIQD
7.0000 mL | Freq: Four times a day (QID) | OROMUCOSAL | Status: DC
  Administered 2013-09-07 – 2013-09-09 (×7): 7 mL via OROMUCOSAL

## 2013-09-07 MED ORDER — MORPHINE SULFATE 4 MG/ML IJ SOLN: 3.0000 mg | INTRAMUSCULAR | Status: DC | PRN

## 2013-09-07 MED ORDER — HYDROMORPHONE HCL PF 1 MG/ML IJ SOLN: 0.2500 mg | INTRAMUSCULAR | Status: DC | PRN

## 2013-09-07 MED ORDER — ARTIFICIAL TEARS OP OINT
TOPICAL_OINTMENT | OPHTHALMIC | Status: AC
  Filled 2013-09-07: qty 3.5

## 2013-09-07 MED ORDER — STERILE WATER FOR INJECTION IJ SOLN
INTRAMUSCULAR | Status: AC
  Filled 2013-09-07: qty 10

## 2013-09-07 MED ORDER — FENTANYL CITRATE 0.05 MG/ML IJ SOLN
25.0000 ug | INTRAMUSCULAR | Status: DC | PRN
  Administered 2013-09-07: 50 ug via INTRAVENOUS
  Administered 2013-09-08 (×2): 100 ug via INTRAVENOUS
  Administered 2013-09-08: 50 ug via INTRAVENOUS
  Administered 2013-09-08: 25 ug via INTRAVENOUS
  Administered 2013-09-08: 75 ug via INTRAVENOUS
  Administered 2013-09-09: 100 ug via INTRAVENOUS
  Administered 2013-09-10 (×2): 50 ug via INTRAVENOUS
  Filled 2013-09-07 (×10): qty 2

## 2013-09-07 MED ORDER — POTASSIUM CHLORIDE 10 MEQ/50ML IV SOLN
10.0000 meq | Freq: Every day | INTRAVENOUS | Status: DC | PRN
  Filled 2013-09-07: qty 50

## 2013-09-07 MED ORDER — ACETAMINOPHEN 160 MG/5ML PO SOLN
1000.0000 mg | Freq: Four times a day (QID) | ORAL | Status: AC
  Filled 2013-09-07: qty 40

## 2013-09-07 MED ORDER — PHENYLEPHRINE HCL 10 MG/ML IJ SOLN
INTRAMUSCULAR | Status: DC | PRN
  Administered 2013-09-07 (×2): 120 ug via INTRAVENOUS
  Administered 2013-09-07: 40 ug via INTRAVENOUS
  Administered 2013-09-07 (×2): 80 ug via INTRAVENOUS
  Administered 2013-09-07: 120 ug via INTRAVENOUS
  Administered 2013-09-07: 160 ug via INTRAVENOUS
  Administered 2013-09-07: 80 ug via INTRAVENOUS

## 2013-09-07 MED ORDER — VANCOMYCIN HCL IN DEXTROSE 1-5 GM/200ML-% IV SOLN
1000.0000 mg | INTRAVENOUS | Status: DC
Start: 2013-09-07 — End: 2013-09-07

## 2013-09-07 MED ORDER — VECURONIUM BROMIDE 10 MG IV SOLR
INTRAVENOUS | Status: AC
  Filled 2013-09-07: qty 10

## 2013-09-07 MED ORDER — DEXTROSE 5 % IV SOLN
10.0000 mg | INTRAVENOUS | Status: DC | PRN
  Administered 2013-09-07: 25 ug/min via INTRAVENOUS

## 2013-09-07 MED ORDER — ACETAMINOPHEN 500 MG PO TABS
1000.0000 mg | ORAL_TABLET | Freq: Four times a day (QID) | ORAL | Status: AC
  Administered 2013-09-08 – 2013-09-12 (×14): 1000 mg via ORAL
  Filled 2013-09-07 (×16): qty 2

## 2013-09-07 MED ORDER — 0.9 % SODIUM CHLORIDE (POUR BTL) OPTIME
TOPICAL | Status: DC | PRN
  Administered 2013-09-07: 2000 mL

## 2013-09-07 MED ORDER — DEXMEDETOMIDINE HCL IN NACL 200 MCG/50ML IV SOLN
INTRAVENOUS | Status: DC | PRN
  Administered 2013-09-07: 0.3 ug/kg/h via INTRAVENOUS

## 2013-09-07 MED ORDER — MORPHINE SULFATE 2 MG/ML IJ SOLN
2.0000 mg | INTRAMUSCULAR | Status: DC | PRN
  Administered 2013-09-07 (×2): 2 mg via INTRAVENOUS
  Filled 2013-09-07 (×4): qty 1

## 2013-09-07 MED ORDER — SUCCINYLCHOLINE CHLORIDE 20 MG/ML IJ SOLN
INTRAMUSCULAR | Status: AC
  Filled 2013-09-07: qty 1

## 2013-09-07 MED ORDER — POTASSIUM CHLORIDE CRYS ER 20 MEQ PO TBCR
40.0000 meq | EXTENDED_RELEASE_TABLET | Freq: Once | ORAL | Status: AC
  Administered 2013-09-07: 40 meq via ORAL
  Filled 2013-09-07: qty 2

## 2013-09-07 MED ORDER — VANCOMYCIN HCL IN DEXTROSE 1-5 GM/200ML-% IV SOLN
1000.0000 mg | Freq: Once | INTRAVENOUS | Status: AC
  Administered 2013-09-07: 1000 mg via INTRAVENOUS
  Filled 2013-09-07: qty 200

## 2013-09-07 MED ORDER — LISINOPRIL 40 MG PO TABS
40.0000 mg | ORAL_TABLET | Freq: Every day | ORAL | Status: DC
  Administered 2013-09-09: 40 mg via ORAL
  Filled 2013-09-07 (×4): qty 1

## 2013-09-07 SURGICAL SUPPLY — 72 items
ADH SKN CLS APL DERMABOND .7 (GAUZE/BANDAGES/DRESSINGS) ×1
APL SRG 22X2 LUM MLBL SLNT (VASCULAR PRODUCTS)
APPLICATOR TIP EXT COSEAL (VASCULAR PRODUCTS) IMPLANT
BAG SPEC RTRVL LRG 6X4 10 (ENDOMECHANICALS)
CANISTER SUCTION 2500CC (MISCELLANEOUS) ×3 IMPLANT
CATH KIT ON Q 5IN SLV (PAIN MANAGEMENT) IMPLANT
CATH THORACIC 28FR (CATHETERS) IMPLANT
CATH THORACIC 28FR RT ANG (CATHETERS) IMPLANT
CATH THORACIC 36FR (CATHETERS) ×2 IMPLANT
CATH THORACIC 36FR RT ANG (CATHETERS) IMPLANT
CLIP TI MEDIUM 6 (CLIP) ×3 IMPLANT
CONN ST 1/4X3/8  BEN (MISCELLANEOUS)
CONN ST 1/4X3/8 BEN (MISCELLANEOUS) IMPLANT
CONN Y 3/8X3/8X3/8  BEN (MISCELLANEOUS)
CONN Y 3/8X3/8X3/8 BEN (MISCELLANEOUS) IMPLANT
CONNECTOR 5 IN 1 STRAIGHT STRL (MISCELLANEOUS) ×4 IMPLANT
CONT SPEC 4OZ CLIKSEAL STRL BL (MISCELLANEOUS) ×6 IMPLANT
COVER SURGICAL LIGHT HANDLE (MISCELLANEOUS) ×4 IMPLANT
DERMABOND ADVANCED (GAUZE/BANDAGES/DRESSINGS) ×2
DERMABOND ADVANCED .7 DNX12 (GAUZE/BANDAGES/DRESSINGS) IMPLANT
DRAIN CHANNEL 32F RND 10.7 FF (WOUND CARE) ×4 IMPLANT
DRAPE LAPAROSCOPIC ABDOMINAL (DRAPES) ×3 IMPLANT
DRAPE WARM FLUID 44X44 (DRAPE) ×2 IMPLANT
ELECT REM PT RETURN 9FT ADLT (ELECTROSURGICAL) ×3
ELECTRODE REM PT RTRN 9FT ADLT (ELECTROSURGICAL) ×1 IMPLANT
GAUZE SPONGE 4X4 12PLY STRL (GAUZE/BANDAGES/DRESSINGS) ×3 IMPLANT
GLOVE SURG SIGNA 7.5 PF LTX (GLOVE) ×6 IMPLANT
GOWN STRL REUS W/ TWL LRG LVL3 (GOWN DISPOSABLE) ×2 IMPLANT
GOWN STRL REUS W/ TWL XL LVL3 (GOWN DISPOSABLE) ×1 IMPLANT
GOWN STRL REUS W/TWL LRG LVL3 (GOWN DISPOSABLE) ×9
GOWN STRL REUS W/TWL XL LVL3 (GOWN DISPOSABLE) ×3
HANDLE STAPLE ENDO GIA SHORT (STAPLE)
HEMOSTAT SURGICEL 2X14 (HEMOSTASIS) IMPLANT
KIT BASIN OR (CUSTOM PROCEDURE TRAY) ×3 IMPLANT
KIT ROOM TURNOVER OR (KITS) ×3 IMPLANT
NS IRRIG 1000ML POUR BTL (IV SOLUTION) ×8 IMPLANT
PACK CHEST (CUSTOM PROCEDURE TRAY) ×3 IMPLANT
PAD ARMBOARD 7.5X6 YLW CONV (MISCELLANEOUS) ×10 IMPLANT
POUCH ENDO CATCH II 15MM (MISCELLANEOUS) IMPLANT
POUCH SPECIMEN RETRIEVAL 10MM (ENDOMECHANICALS) IMPLANT
SEALANT PROGEL (MISCELLANEOUS) IMPLANT
SEALANT SURG COSEAL 4ML (VASCULAR PRODUCTS) IMPLANT
SEALANT SURG COSEAL 8ML (VASCULAR PRODUCTS) IMPLANT
SOLUTION ANTI FOG 6CC (MISCELLANEOUS) ×4 IMPLANT
SPONGE GAUZE 4X4 12PLY STER LF (GAUZE/BANDAGES/DRESSINGS) ×2 IMPLANT
SPONGE INTESTINAL PEANUT (DISPOSABLE) ×10 IMPLANT
SPONGE LAP 18X18 X RAY DECT (DISPOSABLE) ×6 IMPLANT
STAPLER ENDO GIA 12 SHRT THIN (STAPLE) IMPLANT
STAPLER ENDO GIA 12MM SHORT (STAPLE) IMPLANT
SUT PROLENE 4 0 RB 1 (SUTURE)
SUT PROLENE 4-0 RB1 .5 CRCL 36 (SUTURE) IMPLANT
SUT SILK  1 MH (SUTURE) ×4
SUT SILK 1 MH (SUTURE) ×2 IMPLANT
SUT SILK 2 0SH CR/8 30 (SUTURE) IMPLANT
SUT SILK 3 0SH CR/8 30 (SUTURE) IMPLANT
SUT VIC AB 1 CTX 36 (SUTURE) ×3
SUT VIC AB 1 CTX36XBRD ANBCTR (SUTURE) IMPLANT
SUT VIC AB 2-0 CTX 36 (SUTURE) ×2 IMPLANT
SUT VIC AB 3-0 MH 27 (SUTURE) IMPLANT
SUT VIC AB 3-0 X1 27 (SUTURE) ×2 IMPLANT
SUT VICRYL 2 TP 1 (SUTURE) ×2 IMPLANT
SYSTEM SAHARA CHEST DRAIN RE-I (WOUND CARE) ×3 IMPLANT
TAPE CLOTH 4X10 WHT NS (GAUZE/BANDAGES/DRESSINGS) ×3 IMPLANT
TAPE CLOTH SURG 4X10 WHT LF (GAUZE/BANDAGES/DRESSINGS) ×2 IMPLANT
TIP APPLICATOR SPRAY EXTEND 16 (VASCULAR PRODUCTS) IMPLANT
TOWEL OR 17X24 6PK STRL BLUE (TOWEL DISPOSABLE) ×6 IMPLANT
TOWEL OR 17X26 10 PK STRL BLUE (TOWEL DISPOSABLE) ×6 IMPLANT
TRAP SPECIMEN MUCOUS 40CC (MISCELLANEOUS) ×4 IMPLANT
TRAY FOLEY CATH 16FRSI W/METER (SET/KITS/TRAYS/PACK) ×3 IMPLANT
TROCAR XCEL BLADELESS 5X75MML (TROCAR) ×3 IMPLANT
TUNNELER SHEATH ON-Q 11GX8 DSP (PAIN MANAGEMENT) IMPLANT
WATER STERILE IRR 1000ML POUR (IV SOLUTION) ×6 IMPLANT

## 2013-09-07 NOTE — Progress Notes (Signed)
Name: Frank Solomon MRN: [id number] DOB: 06-17-1960    ADMISSION DATE:  09/06/2013 CONSULTATION DATE:  09/06/13  REFERRING MD :  [facility] PRIMARY SERVICE:  [facility]  CHIEF COMPLAINT:  Empyema vs abscess  BRIEF PATIENT DESCRIPTION: 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  Has had mild cough, small amount of hemoptysis occasionally.  In ED, CTA chest demonstrated large right empyema vs effusion and 11x31mm RLL loculation, likely abscess.  Seen by CVTS who requested medical admission.  [facility] requested PCCM consult.  SIGNIFICANT EVENTS:  8/10 - admit 8/11 rt vats  STUDIES: 8/10 CXR >>> b/l opacities, 3x4cm oval structure in RLL 8/10 Chest CTA >>> 11 x 60mm right lung base probable abscess with air fluid level, large right loculated pleural effusion vs empyema.  LINES / TUBES: PIV  CULTURES: Blood 8/10 >>> Sputum 8/10 >>> Urine 8/10 >>>  ANTIBIOTICS: Vanc 8/10 >>> Zosyn 8/10 >>>  HISTORY OF PRESENT ILLNESS:  Frank Solomon is a [name] with PMH of CHF and remote TIA, who presented to [facility] ED 8/10 with SOB and right sided chest discomfort x 1 week, worse over past 2 - 3 days.  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  Since that time, he has had occasional chills and a cough that has mostly been non-productive; however, at times he has noticed a small amount of blood.  He denies any fevers/sweats, wheezing, N/V/D, abdominal pain, myalgias. No recent travel or exposure to known sick contacts. Does report smoking history. In ED, WBC's 33K, HR 100-115, RR 30s during exertion / high teens at rest.  CTA revealed probable right lung abscess and empyema as described above.  CVTS was consulted and they requested medical admission.  [facility] did admit pt and requested PCCM consult.   SUBJECTIVE: Sedated post  vats  VITAL SIGNS: Temp:  [99.8 F (37.7 C)-99.9 F (37.7 C)] 99.8 F (37.7 C) (08/11 0200) Pulse Rate:  [106-133] 110 (08/11 1345) Resp:  [0-43] 12 (08/11 1345) BP: (106-173)/(54-90) 170/90 mmHg (08/11 1340) SpO2:  [95 %-100 %] 95 % (08/11 1345) Arterial Line BP: (116-235)/(59-96) 116/59 mmHg (08/11 1345) FiO2 (%):  [50 %] 50 % (08/11 1325) Weight:  [214 lb 15.2 oz (97.5 kg)-215 lb (97.523 kg)] 214 lb 15.2 oz (97.5 kg) (08/11 0200)  PHYSICAL EXAMINATION: General: WDWN male, sedated on vent Neuro: Sedated HEENT: [place]/AT. PERRL, sclerae anicteric. Cardiovascular: Tachy but regular, no M/R/G.  Lungs: Decreased bs rt. 3 cts rt with 300 cc bloody drainage without air leak   Abdomen: BS x 4, soft, NT/ND.  Musculoskeletal: No gross deformities, no edema.  Skin: Rt vats site dressing c d i.   Recent Labs Lab 09/06/13 2109 09/07/13 0216 09/07/13 0829  NA 137 136* 139  K 3.3* 3.3* 3.5*  CL 98 102 104  CO2 21 20 20   BUN 10 10 9   CREATININE 0.92 0.90 0.85  GLUCOSE 118* 155* 113*    Recent Labs Lab 09/06/13 2109 09/07/13 0216 09/07/13 0829  HGB 11.0* 9.7* 10.7*  HCT 31.4* 28.3* 30.9*  WBC 33.2* 32.7* 31.7*  PLT 458* 416* 420*   Chest 2 View  09/07/2013   CLINICAL DATA:  Preoperative evaluation. Draining abscess. Lung infection.  EXAM: CHEST  2 VIEW  COMPARISON:  Chest radiograph  09/06/2013; chest CT 09/06/2013.  FINDINGS: Stable enlarged cardiac and mediastinal contours with leftward mediastinal shift. Re- demonstrated air-fluid level within the right lower hemithorax compatible with probable large loculated right pleural effusion/empyema. Underlying right lung base consolidative opacities. Small left pleural effusion.  IMPRESSION: Re- demonstrated large right pleural effusion with air-fluid level and underlying consolidative opacities. Opacities are concerning for infection. Large right pleural fluid collection may represent empyema. Gas may be secondary to infection or  bronchopleural fistula.   Electronically Signed   By: Lovey Newcomer M.D.   On: 09/07/2013 09:00   Ct Angio Chest Pe W/cm &/or Wo Cm  09/06/2013   CLINICAL DATA:  Chest pain after lifting injury.  New onset cough.  EXAM: CT ANGIOGRAPHY CHEST WITH CONTRAST  TECHNIQUE: Multidetector CT imaging of the chest was performed using the standard protocol during bolus administration of intravenous contrast. Multiplanar CT image reconstructions and MIPs were obtained to evaluate the vascular anatomy.  CONTRAST:  55mL OMNIPAQUE IOHEXOL 350 MG/ML SOLN  COMPARISON:  Chest radiograph September 06, 2013  FINDINGS: Adequate pulmonary arterial contrast opacification. No pulmonary arterial filling defects to the level of the subsegmental branches.  At least 11 x 12 cm right lower lobe air-fluid level, with thick rind of low-density presumed necrotic tissue, appearing extrapleural with enhancing atelectasis anteriorly, surrounding ground-glass nodules and loculated component. Low-density component along the anterior pleural surface of the right upper lobe. Small left pleural effusion.  12 mm short axis pretracheal, 14 mm short axis subcarinal lymph node. Inflammatory changes of the cardiophrenic angle with probable lymphadenopathy. Tracheobronchial tree is patent and midline. No pneumothorax.  Right lung process mildly displaces the heart to the left. Pericardium is nonsuspicious. Thoracic aorta is normal in course and caliber with mild calcific atherosclerosis.  Included view of the abdomen is unremarkable. Soft tissues are nonsuspicious. No acute osseous process.  Review of the MIP images confirms the above findings.  IMPRESSION: No acute pulmonary embolism.  At least 11 x 12 mm right lung base probable abscess with air-fluid level (which can also be seen with bronchopleural fistula), in addition to large right loculated pleural effusion/empyema. Associated ground-glass opacities are probably infectious. Mediastinal lymphadenopathy is  likely reactive. Right lung process results in mild mediastinal shift to the left.  Small left pleural effusion.  Findings discussed with and reconfirmed by Dr.EMILY WEST on8/10/2015at11:09 pm.   Electronically Signed   By: Elon Alas   On: 09/06/2013 23:13   Dg Chest Port 1 View  09/07/2013   CLINICAL DATA:  Endotracheal and chest tube placement. Video-assisted thoracoscopy with drainage of empyema and decortication, right chest. Postop day 0  EXAM: PORTABLE CHEST - 1 VIEW  COMPARISON:  09/07/2013  FINDINGS: Endotracheal tube tip: 2.7 cm above the carina. Right IJ line tip: SVC.  Multiple right-sided chest tubes are present. Small right basilar pneumothorax peripherally. There is fluid in the minor fissure. Reduced atelectasis at the right lung base, with some underlying interstitial and patchy airspace opacity in the right lung.  Suspected mild atelectasis in the left upper lobe. Mildly enlarged cardiopericardial silhouette. Significantly reduced right pleural effusion. Mildly blunted left lateral costophrenic angle.  IMPRESSION: 1. Interval right empyema drainage and decortication, with small right peripheral basilar pneumothorax, significant decrease in right pleural effusion and right basilar atelectasis, and multiple chest tubes in place. There is some patchy interstitial and airspace opacity in the right lung. 2. Mild atelectasis in the left upper lobe. 3. Mildly enlarged cardiopericardial silhouette. 4. Tubes and lines  appear satisfactorily positioned.   Electronically Signed   By: Sherryl Barters M.D.   On: 09/07/2013 14:13   Dg Chest Port 1 View  09/06/2013   CLINICAL DATA:  54 year old male with shortness of breath and respiratory distress  EXAM: PORTABLE CHEST - 1 VIEW  COMPARISON:  01/04/1998 chest radiograph  FINDINGS: Pulmonary vascular congestion is noted.  Opacities overlying both lower lungs, right greater than left, probably represents effusion and atelectasis/airspace disease.  A 3  x 4 cm oval structure overlying the right lower lung could represent a focal mass.  No acute bony abnormalities are noted.  There is no evidence of pneumothorax.  IMPRESSION: Bilateral lower lung opacities, right greater than left, likely representing pleural effusions and atelectasis/airspace disease.  3 x 4 cm oval structure overlying the right lower lung could represent focal mass. Radiographic follow-up recommended.  Pulmonary vascular congestion.   Electronically Signed   By: Hassan Rowan M.D.   On: 09/06/2013 20:48    ASSESSMENT / PLAN:  Right Empyema vs Effusion Probable Abscess Sepsis - due to above Acute hypoxic respiratory failure Tobacco use disorder Post Vats 8/11 with Rt CT x 3 Recs: ICU and PCCM assume care in ICU. Wean from vent in am. Vent bundle Pain control  abx. DC antihypertensives Vats per CVTS   Richardson Landry Minor ACNP Maryanna Shape PCCM Pager [phone] till 3 pm If no answer page [phone] 09/07/2013, 2:43 PM  Patient seen and examined with NP S. Minor, agree with assess and plan with the following exceptions.  1. Monitor bp closely, seems to opoid sensitive 2. Vent setting, will increase vt and adjust rate, if no further surgical intervention, will plan for extubation in the morning.  3. Pain control - fentanyl for now   CC time: 5mins  Vilinda Boehringer, MD [facility] Pager [phone] On Call Pager [phone]

## 2013-09-07 NOTE — Progress Notes (Signed)
[place] TEAM 1 - Stepdown/ICU TEAM Progress Note  Frank Solomon XNA:[id number] DOB: 10/30/1960 DOA: 09/06/2013 PCP: No primary provider on file.  Admit HPI / Brief Narrative: 53 y.o. year old male with significant past medical history of tobacco abuse, CHF, hx/o TIA presenting with acute resp failure. Pt reports sudden onset of CP and SOB over past 2-3 days. Denies any recent trauma. Has had some mild chills at home. Active smoker, though he reports that he has not smoked since onset of illness. No known sick contacts. Has had some extertional dyspnea and orthopnea. No PND. No LE swelling.  On presentation to the ER. tmax 99.9, HR 100s-120s, RR- 20s-40, SBP 100s-130s. Satting > 95% on 3L. WBC 33.2, hgb 11, K 3.3, Cr 0.92. Trop and lactate WNL. EKG pending. CT angio of the chest shows at least 11x12 cm RLL air fluid level of presumed necrotic tissue in addition to R loculated empyema with infectious ground glass opacities and reactive mediastinal LAD. + mild mediastinal L shift. ED PA discussed case with CT surgery. Recs were for medical admission and consult. Initially started on CAP coverage by EDPA.    HPI/Subjective: 8/11 patient S./P. VATS and per surgery is to remain on vent overnight. Patient is waking up, fighting the vent, signs of pain i.e. SBP high 220s, HR high 170s  Assessment/Plan:  Resp failure/Sepsis  -S/P right VATS -Patient on Precedex drip (at maximum dose) -Patient still on vent and is waking. Orders written for morphine 3-6 mg q 4hr, [facility] M. paged, left orders with patient's RN if [facility] M. was unable to come within the next 10-15 minutes to page me and we would start patient on propofol drip. - continue vanc and zosyn  -pan culture pending   CHF/HTN   -Per [facility] M. -2D ECHO pending resolution of above    Code Status: FULL Family Communication: no family present at time of exam Disposition Plan:Per surgery    Consultants: Dr. Modesto Charon (cardiothoracic  surgery) Dr.Vishal Mungal [facility] M.)  Procedure/Significant Events: 8/11VIDEO ASSISTED THORACOSCOPY (VATS)/ DRAINAGE OF EMPYEMA/ DECORTICATION (Right)    Culture 8/11 pleural fluid pending    Antibiotics: Vancomycin 8/11>> Zosyn 8/11>>    DVT prophylaxis: Lovenox   Devices    LINES / TUBES:  8/10 20ga right forearm  8/11 arterial line 8/11 right IJ double-lumen 8/11 Y chest tube 1/ 36 fr; CT #2/32 fr; CT #3/35fr    Continuous Infusions: . sodium chloride Stopped (09/07/13 0235)  . 0.9 % NaCl with KCl 20 mEq / L 125 mL/hr at 09/07/13 0400  . dexmedetomidine 1.2 mcg/kg/hr (09/07/13 1400)  . dextrose 5 % and 0.9 % NaCl with KCl 20 mEq/L      Objective: VITAL SIGNS: Temp: 99.8 F (37.7 C) (08/11 0200) Temp src: Oral (08/11 0200) BP: 170/90 mmHg (08/11 1340) Pulse Rate: 110 (08/11 1345) SPO2; FIO2:   Intake/Output Summary (Last 24 hours) at 09/07/13 1358 Last data filed at 09/07/13 1330  Gross per 24 hour  Intake 3291.25 ml  Output   1025 ml  Net 2266.25 ml     Exam: General: A./O. x0 intubated and sedated, waking from sedation  Lungs: Tachypnea, Clear to auscultation left lung field; 3 chest tubes present on the right S./P VATS  Cardiovascular: Tachycardic, Regular rhythm without murmur gallop or rub normal S1 and S2 Abdomen: Nontender, nondistended, soft, bowel sounds positive, no rebound, no ascites, no appreciable mass Extremities: No significant cyanosis, clubbing, or edema bilateral lower extremities  Data  Reviewed: Basic Metabolic Panel:  Recent Labs Lab 09/06/13 2109 09/07/13 0216 09/07/13 0829  NA 137 136* 139  K 3.3* 3.3* 3.5*  CL 98 102 104  CO2 21 20 20   GLUCOSE 118* 155* 113*  BUN 10 10 9   CREATININE 0.92 0.90 0.85  CALCIUM 8.4 7.8* 8.2*  MG  --  2.0  --    Liver Function Tests:  Recent Labs Lab 09/06/13 2109 09/07/13 0216 09/07/13 0829  AST 61* 51* 55*  ALT 28 26 26   ALKPHOS 139* 118* 118*  BILITOT 1.2 0.9 0.8    PROT 7.3 6.5 6.8  ALBUMIN 2.2* 1.9* 1.9*   No results found for this basename: LIPASE, AMYLASE,  in the last 168 hours No results found for this basename: AMMONIA,  in the last 168 hours CBC:  Recent Labs Lab 09/06/13 2109 09/07/13 0216 09/07/13 0829  WBC 33.2* 32.7* 31.7*  NEUTROABS  --  27.8*  --   HGB 11.0* 9.7* 10.7*  HCT 31.4* 28.3* 30.9*  MCV 95.4 95.6 97.8  PLT 458* 416* 420*   Cardiac Enzymes:  Recent Labs Lab 09/07/13 0216 09/07/13 0829  TROPONINI <0.30 <0.30   BNP (last 3 results)  Recent Labs  09/06/13 2109  PROBNP 982.1*   CBG: No results found for this basename: GLUCAP,  in the last 168 hours  Recent Results (from the past 240 hour(s))  MRSA PCR SCREENING     Status: None   Collection Time    09/07/13  2:00 AM      Result Value Ref Range Status   MRSA by PCR NEGATIVE  NEGATIVE Final   Comment:            The GeneXpert MRSA Assay (FDA     approved for NASAL specimens     only), is one component of a     comprehensive MRSA colonization     surveillance program. It is not     intended to diagnose MRSA     infection nor to guide or     monitor treatment for     MRSA infections.     Studies:  Recent x-ray studies have been reviewed in detail by the Attending Physician  Scheduled Meds:  Scheduled Meds: . acetaminophen  1,000 mg Oral 4 times per day   Or  . acetaminophen (TYLENOL) oral liquid 160 mg/5 mL  1,000 mg Oral 4 times per day  . albuterol  2.5 mg Nebulization Q4H while awake  . amLODipine  5 mg Oral Daily  . bisacodyl  10 mg Oral Daily  . carvedilol  12.5 mg Oral BID WC  . [START ON 09/08/2013] enoxaparin (LOVENOX) injection  40 mg Subcutaneous Daily  . hydrALAZINE  25 mg Oral BID  . ipratropium  0.5 mg Nebulization Q4H WA  . lisinopril  40 mg Oral Daily  . piperacillin-tazobactam (ZOSYN)  IV  3.375 g Intravenous 3 times per day  . senna-docusate  1 tablet Oral QHS  . sodium chloride  3 mL Intravenous Q12H  . vancomycin   1,000 mg Intravenous Q8H    Time spent on care of this patient: 40 mins   Allie Bossier , MD   [facility] Office  [phone] Pager - [phone]  On-Call/Text Page:      [email or web address]      password [email or web address]  If 7PM-7AM, please contact night-coverage [email or web address] Password [email or web address] 09/07/2013, 1:58 PM   LOS: 1 day

## 2013-09-07 NOTE — ED Provider Notes (Signed)
Medical screening examination/treatment/procedure(s) were conducted as a shared visit with non-physician practitioner(s) and myself.  I personally evaluated the patient during the encounter.   EKG Interpretation None     Pt with shortness of breath and chest pain.  Tachypneic, hypoxic and tachycardic.  EKG sinus tachy.  CXR with RLL abnormalities and pulmonary edema.  CT scan shows empyema lung abscess. Pt feels improved on [place] O2.  IV fluids and abx administered.  D/w CT surgery, triad consulted for admission.  Requested admission to ICU- ICU called, they will consult.    CRITICAL CARE Performed by: Threasa Beards Total critical care time: 35 Critical care time was exclusive of separately billable procedures and treating other patients. Critical care was necessary to treat or prevent imminent or life-threatening deterioration. Critical care was time spent personally by me on the following activities: development of treatment plan with patient and/or surrogate as well as nursing, discussions with consultants, evaluation of patient's response to treatment, examination of patient, obtaining history from patient or surrogate, ordering and performing treatments and interventions, ordering and review of laboratory studies, ordering and review of radiographic studies, pulse oximetry and re-evaluation of patient's condition.  Threasa Beards, MD 09/07/13 [phone]

## 2013-09-07 NOTE — Progress Notes (Signed)
  Echocardiogram 2D Echocardiogram has been performed.  Frank Solomon 09/07/2013, 3:47 PM

## 2013-09-07 NOTE — H&P (Signed)
Hospitalist Admission History and Physical  Patient name: Frank Solomon Medical record number: [id number] Date of birth: Dec 24, 1960 Age: 53 y.o. Gender: male  Primary Care Provider: Kerry Hough, MD  Chief Complaint: acute resp failure, sepsis, lung abscess, empyema   History of Present Illness:This is a 53 y.o. year old male with significant past medical history of tobacco abuse, CHF, hx/o TIA  presenting with acute resp failure. Pt reports sudden onset of CP and SOB over past 2-3 days. Denies any recent trauma. Has had some mild chills at home. Active smoker, though he reports that he has not smoked since onset of illness. No known sick contacts. Has had some extertional dyspnea and orthopnea. No PND. No LE swelling.  On presentation to the ER. tmax 99.9, HR 100s-120s, RR- 20s-40, SBP 100s-130s. Satting > 95% on 3L. WBC 33.2, hgb 11, K 3.3, Cr 0.92. Trop and lactate WNL.  EKG pending. CT angio of the chest shows at least 11x12 cm RLL air fluid level of presumed necrotic tissue in addition to R loculated empyema with infectious ground glass opacities and reactive mediastinal LAD. + mild mediastinal L shift. ED PA discussed case with CT surgery. Recs were for medical admission and consult. Initially started on CAP coverage by EDPA.   Assessment and Plan: Frank Solomon is a 53 y.o. year old male presenting with Acute resp failure, sepsis,lung abscess, empyema    Active Problems:   Acute respiratory failure   Lung abscess   Empyema   Sepsis   1- Resp failure/Sepsis  -likely secondary to lung abscess and empyema  -fair amount of resp distress on exam  -pt unable to speak in full sentences with moderate amount of O2 desaturation with movement -higher concern that pt may require intubation-PCCM c/s pending -CT surgery aware of case. F/u recs in the morning.  -vanc and zosyn -pan culture  -stepdown bed and supplemental O2 pending PCCM recs.   2-CHF/HTN  -euvolemic on exam   -continue outpt regimen-titrate BP regimen as needed given above -2D ECHO pending resolution of above  - noted CP likely 2/2 #1-cycle CEs  -tele bed   FEN/GI: NPO  Prophylaxis: sub q heparin  Disposition: pending further evaluation  Code Status:Full Code    Patient Active Problem List   Diagnosis Date Noted  . Acute respiratory failure 09/07/2013  . Lung abscess 09/07/2013  . Empyema 09/07/2013   Past Medical History: Past Medical History  Diagnosis Date  . TIA (transient ischemic attack)   . CHF (congestive heart failure)     Past Surgical History: History reviewed. No pertinent past surgical history.  Social History: History   Social History  . Marital Status: Divorced    Spouse Name: N/A    Number of Children: N/A  . Years of Education: N/A   Social History Main Topics  . Smoking status: Current Some Day Smoker -- 0.10 packs/day    Types: Cigarettes  . Smokeless tobacco: Never Used  . Alcohol Use: No  . Drug Use: No  . Sexual Activity: None   Other Topics Concern  . None   Social History Narrative  . None    Family History: Family History  Problem Relation Age of Onset  . Dementia Mother   . Diabetes Brother     Allergies: No Known Allergies  Current Facility-Administered Medications  Medication Dose Route Frequency Provider Last Rate Last Dose  . 0.9 %  sodium chloride infusion  1,000 mL Intravenous Continuous Clayton Bibles, PA-C 999  mL/hr at 09/06/13 2314 1,000 mL at 09/06/13 2314  . 0.9 %  sodium chloride infusion   Intravenous Continuous Shanda Howells, MD      . azithromycin (ZITHROMAX) 500 mg in dextrose 5 % 250 mL IVPB  500 mg Intravenous Q24H Dareen Piano, [facility]      . cefTRIAXone (ROCEPHIN) 1 g in dextrose 5 % 50 mL IVPB  1 g Intravenous Q24H Dareen Piano, [facility]      . heparin injection 5,000 Units  5,000 Units Subcutaneous 3 times per day Shanda Howells, MD      . potassium chloride SA (K-DUR,KLOR-CON) CR tablet 40 mEq  40 mEq Oral  Once Emily West, PA-C      . sodium chloride 0.9 % injection 3 mL  3 mL Intravenous Q12H Shanda Howells, MD       Current Outpatient Prescriptions  Medication Sig Dispense Refill  . amLODipine (NORVASC) 5 MG tablet Take 5 mg by mouth daily.      . Camphor-Menthol-Methyl Sal (BEN GAY ULTRA STRENGTH) 05-07-28 % CREA Apply 1 application topically as needed (for sore muscles).      . carvedilol (COREG) 12.5 MG tablet Take 12.5 mg by mouth 2 (two) times daily.      . cetirizine (ZYRTEC) 10 MG tablet Take 10 mg by mouth daily.      . hydrALAZINE (APRESOLINE) 25 MG tablet Take 25 mg by mouth 2 (two) times daily.      Marland Kitchen ibuprofen (ADVIL,MOTRIN) 200 MG tablet Take 200 mg by mouth every 6 (six) hours as needed for headache or mild pain.      Marland Kitchen quinapril (ACCUPRIL) 40 MG tablet Take 40 mg by mouth daily.       Review Of Systems: 12 point ROS negative except as noted above in HPI.  Physical Exam: Filed Vitals:   09/06/13 2300  BP: 133/70  Pulse: 113  Temp:   Resp: 26    General: cooperative, fatigued and moderate distress, unable to speak in full sentences  HEENT: PERRLA Heart: S1, S2 normal, no murmur, rub or gallop, regular rate and rhythm Lungs: decreased breath sounds Abdomen: abdomen is soft without significant tenderness, masses, organomegaly or guarding Extremities: extremities normal, atraumatic, no cyanosis or edema Skin:no rashes Neurology: normal without focal findings  Labs and Imaging: Lab Results  Component Value Date/Time   NA 137 09/06/2013  9:09 PM   K 3.3* 09/06/2013  9:09 PM   CL 98 09/06/2013  9:09 PM   CO2 21 09/06/2013  9:09 PM   BUN 10 09/06/2013  9:09 PM   CREATININE 0.92 09/06/2013  9:09 PM   GLUCOSE 118* 09/06/2013  9:09 PM   Lab Results  Component Value Date   WBC 33.2* 09/06/2013   HGB 11.0* 09/06/2013   HCT 31.4* 09/06/2013   MCV 95.4 09/06/2013   PLT 458* 09/06/2013    Ct Angio Chest Pe W/cm &/or Wo Cm  09/06/2013   CLINICAL DATA:  Chest pain after lifting  injury.  New onset cough.  EXAM: CT ANGIOGRAPHY CHEST WITH CONTRAST  TECHNIQUE: Multidetector CT imaging of the chest was performed using the standard protocol during bolus administration of intravenous contrast. Multiplanar CT image reconstructions and MIPs were obtained to evaluate the vascular anatomy.  CONTRAST:  32mL OMNIPAQUE IOHEXOL 350 MG/ML SOLN  COMPARISON:  Chest radiograph September 06, 2013  FINDINGS: Adequate pulmonary arterial contrast opacification. No pulmonary arterial filling defects to the level of the subsegmental branches.  At least  11 x 12 cm right lower lobe air-fluid level, with thick rind of low-density presumed necrotic tissue, appearing extrapleural with enhancing atelectasis anteriorly, surrounding ground-glass nodules and loculated component. Low-density component along the anterior pleural surface of the right upper lobe. Small left pleural effusion.  12 mm short axis pretracheal, 14 mm short axis subcarinal lymph node. Inflammatory changes of the cardiophrenic angle with probable lymphadenopathy. Tracheobronchial tree is patent and midline. No pneumothorax.  Right lung process mildly displaces the heart to the left. Pericardium is nonsuspicious. Thoracic aorta is normal in course and caliber with mild calcific atherosclerosis.  Included view of the abdomen is unremarkable. Soft tissues are nonsuspicious. No acute osseous process.  Review of the MIP images confirms the above findings.  IMPRESSION: No acute pulmonary embolism.  At least 11 x 12 mm right lung base probable abscess with air-fluid level (which can also be seen with bronchopleural fistula), in addition to large right loculated pleural effusion/empyema. Associated ground-glass opacities are probably infectious. Mediastinal lymphadenopathy is likely reactive. Right lung process results in mild mediastinal shift to the left.  Small left pleural effusion.  Findings discussed with and reconfirmed by Dr.EMILY WEST on8/10/2015at11:09  pm.   Electronically Signed   By: Elon Alas   On: 09/06/2013 23:13   Dg Chest Port 1 View  09/06/2013   CLINICAL DATA:  53 year old male with shortness of breath and respiratory distress  EXAM: PORTABLE CHEST - 1 VIEW  COMPARISON:  01/04/1998 chest radiograph  FINDINGS: Pulmonary vascular congestion is noted.  Opacities overlying both lower lungs, right greater than left, probably represents effusion and atelectasis/airspace disease.  A 3 x 4 cm oval structure overlying the right lower lung could represent a focal mass.  No acute bony abnormalities are noted.  There is no evidence of pneumothorax.  IMPRESSION: Bilateral lower lung opacities, right greater than left, likely representing pleural effusions and atelectasis/airspace disease.  3 x 4 cm oval structure overlying the right lower lung could represent focal mass. Radiographic follow-up recommended.  Pulmonary vascular congestion.   Electronically Signed   By: Hassan Rowan M.D.   On: 09/06/2013 20:48           Shanda Howells MD  Pager: [phone]

## 2013-09-07 NOTE — Anesthesia Preprocedure Evaluation (Addendum)
Anesthesia Evaluation  Patient identified by MRN, date of birth, ID band Patient awake    Airway Mallampati: II TM Distance: >3 FB Neck ROM: Full    Dental  (+) Loose, Chipped, Dental Advisory Given,    Pulmonary Current Smoker,          Cardiovascular +CHF Rhythm:Regular Rate:Tachycardia     Neuro/Psych TIA   GI/Hepatic   Endo/Other    Renal/GU      Musculoskeletal   Abdominal   Peds  Hematology   Anesthesia Other Findings   Reproductive/Obstetrics                          Anesthesia Physical Anesthesia Plan  ASA: III  Anesthesia Plan: General   Post-op Pain Management:    Induction: Intravenous  Airway Management Planned: Double Lumen EBT  Additional Equipment: Arterial line  Intra-op Plan:   Post-operative Plan: Possible Post-op intubation/ventilation  Informed Consent:   Plan Discussed with:   Anesthesia Plan Comments:         Anesthesia Quick Evaluation

## 2013-09-07 NOTE — Transfer of Care (Signed)
Immediate Anesthesia Transfer of Care Note  Patient: Frank Solomon  Procedure(s) Performed: Procedure(s): VIDEO ASSISTED THORACOSCOPY (VATS)/ DRAINAGE OF EMPYEMA/ DECORTICATION (Right)  Patient Location: SICU  Anesthesia Type:General  Level of Consciousness: Patient remains intubated per anesthesia plan  Airway & Oxygen Therapy: Patient remains intubated per anesthesia plan and Patient placed on Ventilator (see vital sign flow sheet for setting)  Post-op Assessment: Report given to PACU RN  Post vital signs: Reviewed and stable  Complications: No apparent anesthesia complications

## 2013-09-07 NOTE — Progress Notes (Signed)
Utilization Review Completed.Donne Anon T8/11/2013

## 2013-09-07 NOTE — Progress Notes (Signed)
ANTIBIOTIC CONSULT NOTE - INITIAL  Pharmacy Consult for Vancomycin and Zosyn  Indication: rule out sepsis  No Known Allergies  Patient Measurements: Height: 6' (182.9 cm) Weight: 215 lb (97.523 kg) IBW/kg (Calculated) : 77.6  Vital Signs: Temp: 99.9 F (37.7 C) (08/10 2000) Temp src: Oral (08/10 2000) BP: 133/70 mmHg (08/10 2300) Pulse Rate: 113 (08/10 2300) Intake/Output from previous day:   Intake/Output from this shift:    Labs:  Recent Labs  09/06/13 2109  WBC 33.2*  HGB 11.0*  PLT 458*  CREATININE 0.92   Estimated Creatinine Clearance: 113.7 ml/min (by C-G formula based on Cr of 0.92). No results found for this basename: VANCOTROUGH, VANCOPEAK, VANCORANDOM, GENTTROUGH, GENTPEAK, GENTRANDOM, TOBRATROUGH, TOBRAPEAK, TOBRARND, AMIKACINPEAK, AMIKACINTROU, AMIKACIN,  in the last 72 hours   Microbiology: No results found for this or any previous visit (from the past 720 hour(s)).  Assessment: 53 yo male with SOB/lung abcess for empiric antibiotics  Goal of Therapy:  Vancomycin trough level 15-20 mcg/ml  Plan:  Vancomycin 1 g IV now, then 1 g IV q8h Zosyn 3.375 g IV q8h   Caryl Pina 09/07/2013,12:22 AM

## 2013-09-07 NOTE — Brief Op Note (Signed)
09/06/2013 - 09/07/2013  1:01 PM  PATIENT:  Frank Solomon  53 y.o. male  PRE-OPERATIVE DIAGNOSIS:  EMPYEMA  POST-OPERATIVE DIAGNOSIS:  Empyema with probable RLL abscess  PROCEDURE:  Procedure(s): VIDEO ASSISTED THORACOSCOPY (VATS)/ DRAINAGE OF EMPYEMA/ DECORTICATION (Right)  SURGEON:  Surgeon(s) and Role:    * Melrose Nakayama, MD - Primary  PHYSICIAN ASSISTANT:   ASSISTANTS: Tree surgeon, RNFA   ANESTHESIA:   general  EBL:  Total I/O In: 1512.5 [I.V.:1500; IV Piggyback:12.5] Out: 800 [Urine:300; Blood:500]  BLOOD ADMINISTERED:none  DRAINS: 3 Chest Tube(s) in the right pleura   LOCAL MEDICATIONS USED:  NONE  SPECIMEN:  Source of Specimen:  pleural fluid and peel  DISPOSITION OF SPECIMEN:  Path and Micro  COUNTS:  YES  PLAN OF CARE: Admit to inpatient   PATIENT DISPOSITION:  ICU - intubated and hemodynamically stable.   Delay start of Pharmacological VTE agent (>24hrs) due to surgical blood loss or risk of bleeding: yes  Foul smelling purulent pleural fluid, suspect anaerobes. Extensive debris and exudate Thick parietal and visceral pleural peels Likely lung abscess in RLL- left peel in that area

## 2013-09-07 NOTE — Interval H&P Note (Signed)
History and Physical Interval Note:  09/07/2013 8:18 AM  Frank Solomon  has presented today for surgery, with the diagnosis of EMPYEMA  The various methods of treatment have been discussed with the patient and family. After consideration of risks, benefits and other options for treatment, the patient has consented to  Procedure(s): VIDEO ASSISTED THORACOSCOPY (VATS)/DRAINAGE OF EMPYEMA/POSSIBLE DECORTICATION (Right) as a surgical intervention .  The patient's history has been reviewed, patient examined, no change in status, stable for surgery.  I have reviewed the patient's chart and labs.  Questions were answered to the patient's satisfaction.     HENDRICKSON,STEVEN C

## 2013-09-07 NOTE — Consult Note (Signed)
Name: Frank Solomon MRN: [id number] DOB: 1960-09-04    ADMISSION DATE:  09/06/2013 CONSULTATION DATE:  09/06/13  REFERRING MD :  [facility] PRIMARY SERVICE:  [facility]  CHIEF COMPLAINT:  Empyema vs abscess  BRIEF PATIENT DESCRIPTION: 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  Has had mild cough, small amount of hemoptysis occasionally.  In ED, CTA chest demonstrated large right empyema vs effusion and 11x7mm RLL loculation, likely abscess.  Seen by CVTS who requested medical admission.  [facility] requested PCCM consult.  SIGNIFICANT EVENTS:  8/10 - admit  STUDIES: 8/10 CXR >>> b/l opacities, 3x4cm oval structure in RLL 8/10 Chest CTA >>> 11 x 88mm right lung base probable abscess with air fluid level, large right loculated pleural effusion vs empyema.  LINES / TUBES: PIV  CULTURES: Blood 8/10 >>> Sputum 8/10 >>> Urine 8/10 >>>  ANTIBIOTICS: Vanc 8/10 >>> Zosyn 8/10 >>>  HISTORY OF PRESENT ILLNESS:  Frank Solomon is a [name] with PMH of CHF and remote TIA, who presented to [facility] ED 8/10 with SOB and right sided chest discomfort x 1 week, worse over past 2 - 3 days.  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  Since that time, he has had occasional chills and a cough that has mostly been non-productive; however, at times he has noticed a small amount of blood.  He denies any fevers/sweats, wheezing, N/V/D, abdominal pain, myalgias. No recent travel or exposure to known sick contacts. Does report smoking history. In ED, WBC's 33K, HR 100-115, RR 30s during exertion / high teens at rest.  CTA revealed probable right lung abscess and empyema as described above.  CVTS was consulted and they requested medical admission.  [facility] did admit pt and requested PCCM consult.  PAST MEDICAL HISTORY :  Past Medical  History  Diagnosis Date  . TIA (transient ischemic attack)   . CHF (congestive heart failure)    History reviewed. No pertinent past surgical history. Prior to Admission medications   Medication Sig Start Date End Date Taking? Authorizing Provider  amLODipine (NORVASC) 5 MG tablet Take 5 mg by mouth daily. 08/29/13  Yes Historical Provider, MD  Camphor-Menthol-Methyl Sal (BEN GAY ULTRA STRENGTH) 05-07-28 % CREA Apply 1 application topically as needed (for sore muscles).   Yes Historical Provider, MD  carvedilol (COREG) 12.5 MG tablet Take 12.5 mg by mouth 2 (two) times daily. 07/29/13  Yes Historical Provider, MD  cetirizine (ZYRTEC) 10 MG tablet Take 10 mg by mouth daily.   Yes Historical Provider, MD  hydrALAZINE (APRESOLINE) 25 MG tablet Take 25 mg by mouth 2 (two) times daily. 08/30/13  Yes Historical Provider, MD  ibuprofen (ADVIL,MOTRIN) 200 MG tablet Take 200 mg by mouth every 6 (six) hours as needed for headache or mild pain.   Yes Historical Provider, MD  quinapril (ACCUPRIL) 40 MG tablet Take 40 mg by mouth daily. 08/29/13  Yes Historical Provider, MD   No Known Allergies  FAMILY HISTORY:  Family History  Problem Relation Age of Onset  . Dementia Mother   . Diabetes Brother    SOCIAL HISTORY:  reports that he has been smoking Cigarettes.  He has been smoking about 0.10 packs per day. He has never used smokeless tobacco. He reports that he does not  drink alcohol or use illicit drugs.  REVIEW OF SYSTEMS:   All negative; except for those that are bolded, which indicate positives.  Constitutional: weight loss, weight gain, night sweats, fevers, chills, fatigue, weakness.  HEENT: headaches, sore throat, sneezing, nasal congestion, post nasal drip, difficulty swallowing, tooth/dental problems, visual complaints, visual changes, ear aches. Neuro: difficulty with speech, weakness, numbness, ataxia. CV:  chest pain, orthopnea, PND, swelling in lower extremities, dizziness, palpitations,  syncope.  Resp: cough, mild occasional hemoptysis, dyspnea, wheezing. GI  heartburn, indigestion, abdominal pain, nausea, vomiting, diarrhea, constipation, change in bowel habits, loss of appetite, hematemesis, melena, hematochezia.  GU: dysuria, change in color of urine, urgency or frequency, flank pain, hematuria. MSK: joint pain or swelling, decreased range of motion. Psych: change in mood or affect, depression, anxiety, suicidal ideations, homicidal ideations. Skin: rash, itching, bruising.   SUBJECTIVE:  Reports continued SOB, mainly with exertion.  Has some right sided chest discomfort, no true chest pain.  Denies fevers.  VITAL SIGNS: Temp:  [99.9 F (37.7 C)] 99.9 F (37.7 C) (08/10 2000) Pulse Rate:  [112-121] 113 (08/10 2300) Resp:  [26-43] 26 (08/10 2300) BP: (106-133)/(54-73) 133/70 mmHg (08/10 2300) SpO2:  [95 %-100 %] 99 % (08/10 2300) Weight:  [97.523 kg (215 lb)] 97.523 kg (215 lb) (08/10 2000)  PHYSICAL EXAMINATION: General: WDWN male, resting in bed, in NAD. Neuro: A&O x 3, non-focal.  HEENT: [place]/AT. PERRL, sclerae anicteric. Cardiovascular: Tachy but regular, no M/R/G.  Lungs: Respirations shallow.  Decreased BS at right base.  Scattered rhonchi.  Tachypneic during exertion and after 5 - 6 sentences, returns to normal during rest.   Abdomen: BS x 4, soft, NT/ND.  Musculoskeletal: No gross deformities, no edema.  Skin: Intact, warm, no rashes.   Recent Labs Lab 09/06/13 2109  NA 137  K 3.3*  CL 98  CO2 21  BUN 10  CREATININE 0.92  GLUCOSE 118*    Recent Labs Lab 09/06/13 2109  HGB 11.0*  HCT 31.4*  WBC 33.2*  PLT 458*   Ct Angio Chest Pe W/cm &/or Wo Cm  09/06/2013   CLINICAL DATA:  Chest pain after lifting injury.  New onset cough.  EXAM: CT ANGIOGRAPHY CHEST WITH CONTRAST  TECHNIQUE: Multidetector CT imaging of the chest was performed using the standard protocol during bolus administration of intravenous contrast. Multiplanar CT image  reconstructions and MIPs were obtained to evaluate the vascular anatomy.  CONTRAST:  72mL OMNIPAQUE IOHEXOL 350 MG/ML SOLN  COMPARISON:  Chest radiograph September 06, 2013  FINDINGS: Adequate pulmonary arterial contrast opacification. No pulmonary arterial filling defects to the level of the subsegmental branches.  At least 11 x 12 cm right lower lobe air-fluid level, with thick rind of low-density presumed necrotic tissue, appearing extrapleural with enhancing atelectasis anteriorly, surrounding ground-glass nodules and loculated component. Low-density component along the anterior pleural surface of the right upper lobe. Small left pleural effusion.  12 mm short axis pretracheal, 14 mm short axis subcarinal lymph node. Inflammatory changes of the cardiophrenic angle with probable lymphadenopathy. Tracheobronchial tree is patent and midline. No pneumothorax.  Right lung process mildly displaces the heart to the left. Pericardium is nonsuspicious. Thoracic aorta is normal in course and caliber with mild calcific atherosclerosis.  Included view of the abdomen is unremarkable. Soft tissues are nonsuspicious. No acute osseous process.  Review of the MIP images confirms the above findings.  IMPRESSION: No acute pulmonary embolism.  At least 11 x 12 mm right lung base probable abscess with  air-fluid level (which can also be seen with bronchopleural fistula), in addition to large right loculated pleural effusion/empyema. Associated ground-glass opacities are probably infectious. Mediastinal lymphadenopathy is likely reactive. Right lung process results in mild mediastinal shift to the left.  Small left pleural effusion.  Findings discussed with and reconfirmed by Dr.EMILY WEST on8/10/2015at11:09 pm.   Electronically Signed   By: Elon Alas   On: 09/06/2013 23:13   Dg Chest Port 1 View  09/06/2013   CLINICAL DATA:  53 year old male with shortness of breath and respiratory distress  EXAM: PORTABLE CHEST - 1 VIEW   COMPARISON:  01/04/1998 chest radiograph  FINDINGS: Pulmonary vascular congestion is noted.  Opacities overlying both lower lungs, right greater than left, probably represents effusion and atelectasis/airspace disease.  A 3 x 4 cm oval structure overlying the right lower lung could represent a focal mass.  No acute bony abnormalities are noted.  There is no evidence of pneumothorax.  IMPRESSION: Bilateral lower lung opacities, right greater than left, likely representing pleural effusions and atelectasis/airspace disease.  3 x 4 cm oval structure overlying the right lower lung could represent focal mass. Radiographic follow-up recommended.  Pulmonary vascular congestion.   Electronically Signed   By: Hassan Rowan M.D.   On: 09/06/2013 20:48    ASSESSMENT / PLAN:  Right Empyema vs Effusion Probable Abscess Sepsis - due to above Acute hypoxic respiratory failure Tobacco use disorder Recs: Admit to SDU, does not appear to require ICU admission at this point. Supplemental O2 as needed to maintain SpO2 > 92%. No need for NIMV / intubation at this time - will continue to monitor. Continue empiric abx. Pan culture. Probable VATS needed - CVTS consulted, awaiting recs in AM.  Tobacco cessation counseling.  Rest per primary.  Montey Hora, [place] Pgr: [phone]  or [phone]  Patient with likely a large empyema, stable for now, will likely go to the OR this AM and will evaluate post OR visit.  In the meantime, continue abx and hopefully drainage in AM.  Patient seen and examined, agree with above note.  I dictated the care and orders written for this patient under my direction.  Rush Farmer, MD [phone]

## 2013-09-07 NOTE — H&P (View-Only) (Signed)
Reason for Consult:right empyema Referring Physician: Dr Ernestina Patches Leonte Horrigan is an 53 y.o. male.  HPI: 53 yo man with a PMH significant for tobacco abuse, CHF and TIA in 1999, no issues with either since then. He presents with cc/o CP and SOB.   He says that he was well until about a week ago. He had been lifting a heavy object and felt pain in his right chest wall. He continued to have some pain on the right side. He denies fevers but did have some chills and cough including small amounts of hemoptysis. Over the last 2-3 days his symptoms worsened and he became more short of breath.  He came to the ED last night and a CT showed a right empyema and a possible lung abscess. He was started on antibiotics. His pain is still present but is better with medication.    Past Medical History  Diagnosis Date  . TIA (transient ischemic attack)   . CHF (congestive heart failure)     History reviewed. No pertinent past surgical history.  Family History  Problem Relation Age of Onset  . Dementia Mother   . Diabetes Brother     Social History:  reports that he has been smoking Cigarettes.  He has been smoking about 0.10 packs per day. He has never used smokeless tobacco. He reports that he does not drink alcohol or use illicit drugs.  Allergies: No Known Allergies  Medications:  Scheduled: . amLODipine  5 mg Oral Daily  . carvedilol  12.5 mg Oral BID WC  . heparin  5,000 Units Subcutaneous 3 times per day  . hydrALAZINE  25 mg Oral BID  . lisinopril  40 mg Oral Daily  . piperacillin-tazobactam (ZOSYN)  IV  3.375 g Intravenous 3 times per day  . sodium chloride  3 mL Intravenous Q12H  . vancomycin  1,000 mg Intravenous Q8H    Results for orders placed during the hospital encounter of 09/06/13 (from the past 48 hour(s))  CBC     Status: Abnormal   Collection Time    09/06/13  9:09 PM      Result Value Ref Range   WBC 33.2 (*) 4.0 - 10.5 K/uL   RBC 3.29 (*) 4.22 - 5.81 MIL/uL    Hemoglobin 11.0 (*) 13.0 - 17.0 g/dL   HCT 31.4 (*) 39.0 - 52.0 %   MCV 95.4  78.0 - 100.0 fL   MCH 33.4  26.0 - 34.0 pg   MCHC 35.0  30.0 - 36.0 g/dL   RDW 13.8  11.5 - 15.5 %   Platelets 458 (*) 150 - 400 K/uL  BASIC METABOLIC PANEL     Status: Abnormal   Collection Time    09/06/13  9:09 PM      Result Value Ref Range   Sodium 137  137 - 147 mEq/L   Potassium 3.3 (*) 3.7 - 5.3 mEq/L   Chloride 98  96 - 112 mEq/L   CO2 21  19 - 32 mEq/L   Glucose, Bld 118 (*) 70 - 99 mg/dL   BUN 10  6 - 23 mg/dL   Creatinine, Ser 0.92  0.50 - 1.35 mg/dL   Calcium 8.4  8.4 - 10.5 mg/dL   GFR calc non Af Amer >90  >90 mL/min   GFR calc Af Amer >90  >90 mL/min   Comment: (NOTE)     The eGFR has been calculated using the CKD EPI equation.  This calculation has not been validated in all clinical situations.     eGFR's persistently <90 mL/min signify possible Chronic Kidney     Disease.   Anion gap 18 (*) 5 - 15  PRO B NATRIURETIC PEPTIDE     Status: Abnormal   Collection Time    09/06/13  9:09 PM      Result Value Ref Range   Pro B Natriuretic peptide (BNP) 982.1 (*) 0 - 125 pg/mL  HEPATIC FUNCTION PANEL     Status: Abnormal   Collection Time    09/06/13  9:09 PM      Result Value Ref Range   Total Protein 7.3  6.0 - 8.3 g/dL   Albumin 2.2 (*) 3.5 - 5.2 g/dL   AST 61 (*) 0 - 37 U/L   ALT 28  0 - 53 U/L   Alkaline Phosphatase 139 (*) 39 - 117 U/L   Total Bilirubin 1.2  0.3 - 1.2 mg/dL   Bilirubin, Direct 0.4 (*) 0.0 - 0.3 mg/dL   Indirect Bilirubin 0.8  0.3 - 0.9 mg/dL  Randolm Idol, ED     Status: None   Collection Time    09/06/13  9:14 PM      Result Value Ref Range   Troponin i, poc 0.00  0.00 - 0.08 ng/mL   Comment 3            Comment: Due to the release kinetics of cTnI,     a negative result within the first hours     of the onset of symptoms does not rule out     myocardial infarction with certainty.     If myocardial infarction is still suspected,     repeat the  test at appropriate intervals.  I-STAT CG4 LACTIC ACID, ED     Status: None   Collection Time    09/06/13  9:16 PM      Result Value Ref Range   Lactic Acid, Venous 1.82  0.5 - 2.2 mmol/L  URINALYSIS, ROUTINE W REFLEX MICROSCOPIC     Status: Abnormal   Collection Time    09/07/13  1:21 AM      Result Value Ref Range   Color, Urine AMBER (*) YELLOW   Comment: BIOCHEMICALS MAY BE AFFECTED BY COLOR   APPearance CLEAR  CLEAR   Specific Gravity, Urine <1.005 (*) 1.005 - 1.030   pH 6.0  5.0 - 8.0   Glucose, UA NEGATIVE  NEGATIVE mg/dL   Hgb urine dipstick NEGATIVE  NEGATIVE   Bilirubin Urine SMALL (*) NEGATIVE   Ketones, ur NEGATIVE  NEGATIVE mg/dL   Protein, ur 30 (*) NEGATIVE mg/dL   Urobilinogen, UA >8.0 (*) 0.0 - 1.0 mg/dL   Nitrite POSITIVE (*) NEGATIVE   Leukocytes, UA NEGATIVE  NEGATIVE  URINE MICROSCOPIC-ADD ON     Status: Abnormal   Collection Time    09/07/13  1:21 AM      Result Value Ref Range   Squamous Epithelial / LPF FEW (*) RARE   WBC, UA 0-2  <3 WBC/hpf   RBC / HPF 0-2  <3 RBC/hpf   Bacteria, UA FEW (*) RARE  MRSA PCR SCREENING     Status: None   Collection Time    09/07/13  2:00 AM      Result Value Ref Range   MRSA by PCR NEGATIVE  NEGATIVE   Comment:            The GeneXpert MRSA Assay (  FDA     approved for NASAL specimens     only), is one component of a     comprehensive MRSA colonization     surveillance program. It is not     intended to diagnose MRSA     infection nor to guide or     monitor treatment for     MRSA infections.  COMPREHENSIVE METABOLIC PANEL     Status: Abnormal   Collection Time    09/07/13  2:16 AM      Result Value Ref Range   Sodium 136 (*) 137 - 147 mEq/L   Potassium 3.3 (*) 3.7 - 5.3 mEq/L   Chloride 102  96 - 112 mEq/L   CO2 20  19 - 32 mEq/L   Glucose, Bld 155 (*) 70 - 99 mg/dL   BUN 10  6 - 23 mg/dL   Creatinine, Ser 0.90  0.50 - 1.35 mg/dL   Calcium 7.8 (*) 8.4 - 10.5 mg/dL   Total Protein 6.5  6.0 - 8.3 g/dL    Albumin 1.9 (*) 3.5 - 5.2 g/dL   AST 51 (*) 0 - 37 U/L   ALT 26  0 - 53 U/L   Alkaline Phosphatase 118 (*) 39 - 117 U/L   Total Bilirubin 0.9  0.3 - 1.2 mg/dL   GFR calc non Af Amer >90  >90 mL/min   GFR calc Af Amer >90  >90 mL/min   Comment: (NOTE)     The eGFR has been calculated using the CKD EPI equation.     This calculation has not been validated in all clinical situations.     eGFR's persistently <90 mL/min signify possible Chronic Kidney     Disease.   Anion gap 14  5 - 15  CBC WITH DIFFERENTIAL     Status: Abnormal   Collection Time    09/07/13  2:16 AM      Result Value Ref Range   WBC 32.7 (*) 4.0 - 10.5 K/uL   RBC 2.96 (*) 4.22 - 5.81 MIL/uL   Hemoglobin 9.7 (*) 13.0 - 17.0 g/dL   HCT 28.3 (*) 39.0 - 52.0 %   MCV 95.6  78.0 - 100.0 fL   MCH 32.8  26.0 - 34.0 pg   MCHC 34.3  30.0 - 36.0 g/dL   RDW 14.0  11.5 - 15.5 %   Platelets 416 (*) 150 - 400 K/uL   Neutrophils Relative % 85 (*) 43 - 77 %   Lymphocytes Relative 10 (*) 12 - 46 %   Monocytes Relative 5  3 - 12 %   Eosinophils Relative 0  0 - 5 %   Basophils Relative 0  0 - 1 %   Neutro Abs 27.8 (*) 1.7 - 7.7 K/uL   Lymphs Abs 3.3  0.7 - 4.0 K/uL   Monocytes Absolute 1.6 (*) 0.1 - 1.0 K/uL   Eosinophils Absolute 0.0  0.0 - 0.7 K/uL   Basophils Absolute 0.0  0.0 - 0.1 K/uL   RBC Morphology POLYCHROMASIA PRESENT     Comment: TARGET CELLS  TROPONIN I     Status: None   Collection Time    09/07/13  2:16 AM      Result Value Ref Range   Troponin I <0.30  <0.30 ng/mL   Comment:            Due to the release kinetics of cTnI,     a negative result within the first hours  of the onset of symptoms does not rule out     myocardial infarction with certainty.     If myocardial infarction is still suspected,     repeat the test at appropriate intervals.  MAGNESIUM     Status: None   Collection Time    09/07/13  2:16 AM      Result Value Ref Range   Magnesium 2.0  1.5 - 2.5 mg/dL    Ct Angio Chest Pe W/cm  &/or Wo Cm  09/06/2013   CLINICAL DATA:  Chest pain after lifting injury.  New onset cough.  EXAM: CT ANGIOGRAPHY CHEST WITH CONTRAST  TECHNIQUE: Multidetector CT imaging of the chest was performed using the standard protocol during bolus administration of intravenous contrast. Multiplanar CT image reconstructions and MIPs were obtained to evaluate the vascular anatomy.  CONTRAST:  50m OMNIPAQUE IOHEXOL 350 MG/ML SOLN  COMPARISON:  Chest radiograph September 06, 2013  FINDINGS: Adequate pulmonary arterial contrast opacification. No pulmonary arterial filling defects to the level of the subsegmental branches.  At least 11 x 12 cm right lower lobe air-fluid level, with thick rind of low-density presumed necrotic tissue, appearing extrapleural with enhancing atelectasis anteriorly, surrounding ground-glass nodules and loculated component. Low-density component along the anterior pleural surface of the right upper lobe. Small left pleural effusion.  12 mm short axis pretracheal, 14 mm short axis subcarinal lymph node. Inflammatory changes of the cardiophrenic angle with probable lymphadenopathy. Tracheobronchial tree is patent and midline. No pneumothorax.  Right lung process mildly displaces the heart to the left. Pericardium is nonsuspicious. Thoracic aorta is normal in course and caliber with mild calcific atherosclerosis.  Included view of the abdomen is unremarkable. Soft tissues are nonsuspicious. No acute osseous process.  Review of the MIP images confirms the above findings.  IMPRESSION: No acute pulmonary embolism.  At least 11 x 12 mm right lung base probable abscess with air-fluid level (which can also be seen with bronchopleural fistula), in addition to large right loculated pleural effusion/empyema. Associated ground-glass opacities are probably infectious. Mediastinal lymphadenopathy is likely reactive. Right lung process results in mild mediastinal shift to the left.  Small left pleural effusion.  Findings  discussed with and reconfirmed by Dr.EMILY WEST on8/10/2015at11:09 pm.   Electronically Signed   By: CElon Alas  On: 09/06/2013 23:13   Dg Chest Port 1 View  09/06/2013   CLINICAL DATA:  53year old male with shortness of breath and respiratory distress  EXAM: PORTABLE CHEST - 1 VIEW  COMPARISON:  01/04/1998 chest radiograph  FINDINGS: Pulmonary vascular congestion is noted.  Opacities overlying both lower lungs, right greater than left, probably represents effusion and atelectasis/airspace disease.  A 3 x 4 cm oval structure overlying the right lower lung could represent a focal mass.  No acute bony abnormalities are noted.  There is no evidence of pneumothorax.  IMPRESSION: Bilateral lower lung opacities, right greater than left, likely representing pleural effusions and atelectasis/airspace disease.  3 x 4 cm oval structure overlying the right lower lung could represent focal mass. Radiographic follow-up recommended.  Pulmonary vascular congestion.   Electronically Signed   By: JHassan RowanM.D.   On: 09/06/2013 20:48    Review of Systems  Constitutional: Positive for chills and malaise/fatigue. Negative for fever.  Respiratory: Positive for cough, hemoptysis and shortness of breath.   Cardiovascular: Positive for chest pain (right side pleuritic).  All other systems reviewed and are negative.  Blood pressure 128/72, pulse 116, temperature 99.8 F (37.7  C), temperature source Oral, resp. rate 29, height 6' (1.829 m), weight 214 lb 15.2 oz (97.5 kg), SpO2 96.00%. Physical Exam  Vitals reviewed. Constitutional: He is oriented to person, place, and time. He appears well-developed and well-nourished. He appears distressed (moderate).  HENT:  Head: Normocephalic and atraumatic.  Eyes: EOM are normal. Pupils are equal, round, and reactive to light.  Neck: Neck supple. No thyromegaly present.  Cardiovascular:  Tachy, regular  Respiratory:  Absent BS right base  GI: Soft. There is no  tenderness.  Musculoskeletal: He exhibits no edema.  Lymphadenopathy:    He has no cervical adenopathy.  Neurological: He is alert and oriented to person, place, and time. No cranial nerve deficit.  Skin: Skin is warm and dry.    Assessment/Plan: 53 yo man with a large right empyema. Radiology feels he has a large RLL abscess, which is a possibility but it appears more consistent with a loculated empyema. In any event he needs a right VATS, drainage of empyema, decortication and possibly a lobectomy.  I discussed the general nature of the procedure, the need for general anesthesia, and the incisions to be used with Mr. Currier. We discussed the expected hospital stay, overall recovery and short and long term outcomes. I informed him of the indications, risks, benefits and alternatives. He understands the risks include but are not limited to death, stroke, MI, DVT/PE, bleeding, possible need for transfusion, infections, prolonged air leaks, and other organ system dysfunction including respiratory, renal, or GI complications. He agrees to proceed.  He has been NPO. OR notified, will proceed this AM  Marlyss Cissell C 09/07/2013, 7:09 AM

## 2013-09-07 NOTE — Plan of Care (Signed)
Problem: ICU Phase Progression Outcomes Goal: Pain controlled with appropriate interventions Outcome: Progressing Patient able to sleep after given pain medication.

## 2013-09-07 NOTE — Progress Notes (Signed)
[place] Progress Note Patient Name: Frank Solomon DOB: 10/13/1960 MRN: [id number]  Date of Service  09/07/2013   HPI/Events of Note   Hypotensive.  eICU Interventions  Will give additional fluid bolus >> if no response, then add pressors.   Intervention Category Major Interventions: Other:  [name] 09/07/2013, 3:42 PM

## 2013-09-07 NOTE — Consult Note (Signed)
Reason for Consult:right empyema Referring Physician: Dr Ernestina Patches Leonte Horrigan is an 53 y.o. male.  HPI: 53 yo man with a PMH significant for tobacco abuse, CHF and TIA in 1999, no issues with either since then. He presents with cc/o CP and SOB.   He says that he was well until about a week ago. He had been lifting a heavy object and felt pain in his right chest wall. He continued to have some pain on the right side. He denies fevers but did have some chills and cough including small amounts of hemoptysis. Over the last 2-3 days his symptoms worsened and he became more short of breath.  He came to the ED last night and a CT showed a right empyema and a possible lung abscess. He was started on antibiotics. His pain is still present but is better with medication.    Past Medical History  Diagnosis Date  . TIA (transient ischemic attack)   . CHF (congestive heart failure)     History reviewed. No pertinent past surgical history.  Family History  Problem Relation Age of Onset  . Dementia Mother   . Diabetes Brother     Social History:  reports that he has been smoking Cigarettes.  He has been smoking about 0.10 packs per day. He has never used smokeless tobacco. He reports that he does not drink alcohol or use illicit drugs.  Allergies: No Known Allergies  Medications:  Scheduled: . amLODipine  5 mg Oral Daily  . carvedilol  12.5 mg Oral BID WC  . heparin  5,000 Units Subcutaneous 3 times per day  . hydrALAZINE  25 mg Oral BID  . lisinopril  40 mg Oral Daily  . piperacillin-tazobactam (ZOSYN)  IV  3.375 g Intravenous 3 times per day  . sodium chloride  3 mL Intravenous Q12H  . vancomycin  1,000 mg Intravenous Q8H    Results for orders placed during the hospital encounter of 09/06/13 (from the past 48 hour(s))  CBC     Status: Abnormal   Collection Time    09/06/13  9:09 PM      Result Value Ref Range   WBC 33.2 (*) 4.0 - 10.5 K/uL   RBC 3.29 (*) 4.22 - 5.81 MIL/uL    Hemoglobin 11.0 (*) 13.0 - 17.0 g/dL   HCT 31.4 (*) 39.0 - 52.0 %   MCV 95.4  78.0 - 100.0 fL   MCH 33.4  26.0 - 34.0 pg   MCHC 35.0  30.0 - 36.0 g/dL   RDW 13.8  11.5 - 15.5 %   Platelets 458 (*) 150 - 400 K/uL  BASIC METABOLIC PANEL     Status: Abnormal   Collection Time    09/06/13  9:09 PM      Result Value Ref Range   Sodium 137  137 - 147 mEq/L   Potassium 3.3 (*) 3.7 - 5.3 mEq/L   Chloride 98  96 - 112 mEq/L   CO2 21  19 - 32 mEq/L   Glucose, Bld 118 (*) 70 - 99 mg/dL   BUN 10  6 - 23 mg/dL   Creatinine, Ser 0.92  0.50 - 1.35 mg/dL   Calcium 8.4  8.4 - 10.5 mg/dL   GFR calc non Af Amer >90  >90 mL/min   GFR calc Af Amer >90  >90 mL/min   Comment: (NOTE)     The eGFR has been calculated using the CKD EPI equation.  This calculation has not been validated in all clinical situations.     eGFR's persistently <90 mL/min signify possible Chronic Kidney     Disease.   Anion gap 18 (*) 5 - 15  PRO B NATRIURETIC PEPTIDE     Status: Abnormal   Collection Time    09/06/13  9:09 PM      Result Value Ref Range   Pro B Natriuretic peptide (BNP) 982.1 (*) 0 - 125 pg/mL  HEPATIC FUNCTION PANEL     Status: Abnormal   Collection Time    09/06/13  9:09 PM      Result Value Ref Range   Total Protein 7.3  6.0 - 8.3 g/dL   Albumin 2.2 (*) 3.5 - 5.2 g/dL   AST 61 (*) 0 - 37 U/L   ALT 28  0 - 53 U/L   Alkaline Phosphatase 139 (*) 39 - 117 U/L   Total Bilirubin 1.2  0.3 - 1.2 mg/dL   Bilirubin, Direct 0.4 (*) 0.0 - 0.3 mg/dL   Indirect Bilirubin 0.8  0.3 - 0.9 mg/dL  Randolm Idol, ED     Status: None   Collection Time    09/06/13  9:14 PM      Result Value Ref Range   Troponin i, poc 0.00  0.00 - 0.08 ng/mL   Comment 3            Comment: Due to the release kinetics of cTnI,     a negative result within the first hours     of the onset of symptoms does not rule out     myocardial infarction with certainty.     If myocardial infarction is still suspected,     repeat the  test at appropriate intervals.  I-STAT CG4 LACTIC ACID, ED     Status: None   Collection Time    09/06/13  9:16 PM      Result Value Ref Range   Lactic Acid, Venous 1.82  0.5 - 2.2 mmol/L  URINALYSIS, ROUTINE W REFLEX MICROSCOPIC     Status: Abnormal   Collection Time    09/07/13  1:21 AM      Result Value Ref Range   Color, Urine AMBER (*) YELLOW   Comment: BIOCHEMICALS MAY BE AFFECTED BY COLOR   APPearance CLEAR  CLEAR   Specific Gravity, Urine <1.005 (*) 1.005 - 1.030   pH 6.0  5.0 - 8.0   Glucose, UA NEGATIVE  NEGATIVE mg/dL   Hgb urine dipstick NEGATIVE  NEGATIVE   Bilirubin Urine SMALL (*) NEGATIVE   Ketones, ur NEGATIVE  NEGATIVE mg/dL   Protein, ur 30 (*) NEGATIVE mg/dL   Urobilinogen, UA >8.0 (*) 0.0 - 1.0 mg/dL   Nitrite POSITIVE (*) NEGATIVE   Leukocytes, UA NEGATIVE  NEGATIVE  URINE MICROSCOPIC-ADD ON     Status: Abnormal   Collection Time    09/07/13  1:21 AM      Result Value Ref Range   Squamous Epithelial / LPF FEW (*) RARE   WBC, UA 0-2  <3 WBC/hpf   RBC / HPF 0-2  <3 RBC/hpf   Bacteria, UA FEW (*) RARE  MRSA PCR SCREENING     Status: None   Collection Time    09/07/13  2:00 AM      Result Value Ref Range   MRSA by PCR NEGATIVE  NEGATIVE   Comment:            The GeneXpert MRSA Assay (  FDA     approved for NASAL specimens     only), is one component of a     comprehensive MRSA colonization     surveillance program. It is not     intended to diagnose MRSA     infection nor to guide or     monitor treatment for     MRSA infections.  COMPREHENSIVE METABOLIC PANEL     Status: Abnormal   Collection Time    09/07/13  2:16 AM      Result Value Ref Range   Sodium 136 (*) 137 - 147 mEq/L   Potassium 3.3 (*) 3.7 - 5.3 mEq/L   Chloride 102  96 - 112 mEq/L   CO2 20  19 - 32 mEq/L   Glucose, Bld 155 (*) 70 - 99 mg/dL   BUN 10  6 - 23 mg/dL   Creatinine, Ser 0.90  0.50 - 1.35 mg/dL   Calcium 7.8 (*) 8.4 - 10.5 mg/dL   Total Protein 6.5  6.0 - 8.3 g/dL    Albumin 1.9 (*) 3.5 - 5.2 g/dL   AST 51 (*) 0 - 37 U/L   ALT 26  0 - 53 U/L   Alkaline Phosphatase 118 (*) 39 - 117 U/L   Total Bilirubin 0.9  0.3 - 1.2 mg/dL   GFR calc non Af Amer >90  >90 mL/min   GFR calc Af Amer >90  >90 mL/min   Comment: (NOTE)     The eGFR has been calculated using the CKD EPI equation.     This calculation has not been validated in all clinical situations.     eGFR's persistently <90 mL/min signify possible Chronic Kidney     Disease.   Anion gap 14  5 - 15  CBC WITH DIFFERENTIAL     Status: Abnormal   Collection Time    09/07/13  2:16 AM      Result Value Ref Range   WBC 32.7 (*) 4.0 - 10.5 K/uL   RBC 2.96 (*) 4.22 - 5.81 MIL/uL   Hemoglobin 9.7 (*) 13.0 - 17.0 g/dL   HCT 28.3 (*) 39.0 - 52.0 %   MCV 95.6  78.0 - 100.0 fL   MCH 32.8  26.0 - 34.0 pg   MCHC 34.3  30.0 - 36.0 g/dL   RDW 14.0  11.5 - 15.5 %   Platelets 416 (*) 150 - 400 K/uL   Neutrophils Relative % 85 (*) 43 - 77 %   Lymphocytes Relative 10 (*) 12 - 46 %   Monocytes Relative 5  3 - 12 %   Eosinophils Relative 0  0 - 5 %   Basophils Relative 0  0 - 1 %   Neutro Abs 27.8 (*) 1.7 - 7.7 K/uL   Lymphs Abs 3.3  0.7 - 4.0 K/uL   Monocytes Absolute 1.6 (*) 0.1 - 1.0 K/uL   Eosinophils Absolute 0.0  0.0 - 0.7 K/uL   Basophils Absolute 0.0  0.0 - 0.1 K/uL   RBC Morphology POLYCHROMASIA PRESENT     Comment: TARGET CELLS  TROPONIN I     Status: None   Collection Time    09/07/13  2:16 AM      Result Value Ref Range   Troponin I <0.30  <0.30 ng/mL   Comment:            Due to the release kinetics of cTnI,     a negative result within the first hours  of the onset of symptoms does not rule out     myocardial infarction with certainty.     If myocardial infarction is still suspected,     repeat the test at appropriate intervals.  MAGNESIUM     Status: None   Collection Time    09/07/13  2:16 AM      Result Value Ref Range   Magnesium 2.0  1.5 - 2.5 mg/dL    Ct Angio Chest Pe W/cm  &/or Wo Cm  09/06/2013   CLINICAL DATA:  Chest pain after lifting injury.  New onset cough.  EXAM: CT ANGIOGRAPHY CHEST WITH CONTRAST  TECHNIQUE: Multidetector CT imaging of the chest was performed using the standard protocol during bolus administration of intravenous contrast. Multiplanar CT image reconstructions and MIPs were obtained to evaluate the vascular anatomy.  CONTRAST:  50m OMNIPAQUE IOHEXOL 350 MG/ML SOLN  COMPARISON:  Chest radiograph September 06, 2013  FINDINGS: Adequate pulmonary arterial contrast opacification. No pulmonary arterial filling defects to the level of the subsegmental branches.  At least 11 x 12 cm right lower lobe air-fluid level, with thick rind of low-density presumed necrotic tissue, appearing extrapleural with enhancing atelectasis anteriorly, surrounding ground-glass nodules and loculated component. Low-density component along the anterior pleural surface of the right upper lobe. Small left pleural effusion.  12 mm short axis pretracheal, 14 mm short axis subcarinal lymph node. Inflammatory changes of the cardiophrenic angle with probable lymphadenopathy. Tracheobronchial tree is patent and midline. No pneumothorax.  Right lung process mildly displaces the heart to the left. Pericardium is nonsuspicious. Thoracic aorta is normal in course and caliber with mild calcific atherosclerosis.  Included view of the abdomen is unremarkable. Soft tissues are nonsuspicious. No acute osseous process.  Review of the MIP images confirms the above findings.  IMPRESSION: No acute pulmonary embolism.  At least 11 x 12 mm right lung base probable abscess with air-fluid level (which can also be seen with bronchopleural fistula), in addition to large right loculated pleural effusion/empyema. Associated ground-glass opacities are probably infectious. Mediastinal lymphadenopathy is likely reactive. Right lung process results in mild mediastinal shift to the left.  Small left pleural effusion.  Findings  discussed with and reconfirmed by Dr.EMILY WEST on8/10/2015at11:09 pm.   Electronically Signed   By: CElon Alas  On: 09/06/2013 23:13   Dg Chest Port 1 View  09/06/2013   CLINICAL DATA:  53year old male with shortness of breath and respiratory distress  EXAM: PORTABLE CHEST - 1 VIEW  COMPARISON:  01/04/1998 chest radiograph  FINDINGS: Pulmonary vascular congestion is noted.  Opacities overlying both lower lungs, right greater than left, probably represents effusion and atelectasis/airspace disease.  A 3 x 4 cm oval structure overlying the right lower lung could represent a focal mass.  No acute bony abnormalities are noted.  There is no evidence of pneumothorax.  IMPRESSION: Bilateral lower lung opacities, right greater than left, likely representing pleural effusions and atelectasis/airspace disease.  3 x 4 cm oval structure overlying the right lower lung could represent focal mass. Radiographic follow-up recommended.  Pulmonary vascular congestion.   Electronically Signed   By: JHassan RowanM.D.   On: 09/06/2013 20:48    Review of Systems  Constitutional: Positive for chills and malaise/fatigue. Negative for fever.  Respiratory: Positive for cough, hemoptysis and shortness of breath.   Cardiovascular: Positive for chest pain (right side pleuritic).  All other systems reviewed and are negative.  Blood pressure 128/72, pulse 116, temperature 99.8 F (37.7  C), temperature source Oral, resp. rate 29, height 6' (1.829 m), weight 214 lb 15.2 oz (97.5 kg), SpO2 96.00%. Physical Exam  Vitals reviewed. Constitutional: He is oriented to person, place, and time. He appears well-developed and well-nourished. He appears distressed (moderate).  HENT:  Head: Normocephalic and atraumatic.  Eyes: EOM are normal. Pupils are equal, round, and reactive to light.  Neck: Neck supple. No thyromegaly present.  Cardiovascular:  Tachy, regular  Respiratory:  Absent BS right base  GI: Soft. There is no  tenderness.  Musculoskeletal: He exhibits no edema.  Lymphadenopathy:    He has no cervical adenopathy.  Neurological: He is alert and oriented to person, place, and time. No cranial nerve deficit.  Skin: Skin is warm and dry.    Assessment/Plan: 53 yo man with a large right empyema. Radiology feels he has a large RLL abscess, which is a possibility but it appears more consistent with a loculated empyema. In any event he needs a right VATS, drainage of empyema, decortication and possibly a lobectomy.  I discussed the general nature of the procedure, the need for general anesthesia, and the incisions to be used with Mr. Currier. We discussed the expected hospital stay, overall recovery and short and long term outcomes. I informed him of the indications, risks, benefits and alternatives. He understands the risks include but are not limited to death, stroke, MI, DVT/PE, bleeding, possible need for transfusion, infections, prolonged air leaks, and other organ system dysfunction including respiratory, renal, or GI complications. He agrees to proceed.  He has been NPO. OR notified, will proceed this AM  Brelynn Wheller C 09/07/2013, 7:09 AM

## 2013-09-07 NOTE — Anesthesia Postprocedure Evaluation (Signed)
  Anesthesia Post-op Note  Patient: Frank Solomon  Procedure(s) Performed: Procedure(s): VIDEO ASSISTED THORACOSCOPY (VATS)/ DRAINAGE OF EMPYEMA/ DECORTICATION (Right)  Patient Location: SICU  Anesthesia Type:General  Level of Consciousness: sedated, unresponsive and Patient remains intubated per anesthesia plan  Airway and Oxygen Therapy: Patient remains intubated per anesthesia plan and Patient placed on Ventilator (see vital sign flow sheet for setting)  Post-op Pain: none  Post-op Assessment: Post-op Vital signs reviewed and Patient's Cardiovascular Status Stable  Post-op Vital Signs: stable  Last Vitals:  Filed Vitals:   09/07/13 1345  BP:   Pulse: 110  Temp:   Resp: 12    Complications: No apparent anesthesia complications

## 2013-09-07 NOTE — Anesthesia Procedure Notes (Signed)
Procedure Name: Intubation Date/Time: 09/07/2013 9:53 AM Performed by: Barrington Ellison Pre-anesthesia Checklist: Patient identified, Emergency Drugs available, Suction available, Patient being monitored and Timeout performed Patient Re-evaluated:Patient Re-evaluated prior to inductionOxygen Delivery Method: Circle system utilized Preoxygenation: Pre-oxygenation with 100% oxygen Intubation Type: IV induction Ventilation: Mask ventilation without difficulty Laryngoscope Size: Mac and 4 Grade View: Grade I Tube type: Oral Endobronchial tube: Double lumen EBT and 39 Fr Number of attempts: 1 Airway Equipment and Method: Stylet Placement Confirmation: ETT inserted through vocal cords under direct vision,  positive ETCO2 and breath sounds checked- equal and bilateral Secured at: 31 cm Tube secured with: Tape Dental Injury: Teeth and Oropharynx as per pre-operative assessment

## 2013-09-07 NOTE — Progress Notes (Signed)
CT Surgery PM Rounds  Resting on vent S/P VATS for empyema BP now > 496 mm Hg systolic after fluid bolus Min chest tube drainage w/o air leak Urine output improving

## 2013-09-07 NOTE — Progress Notes (Signed)
[facility] Progress Note Patient Name: Shuan Statzer DOB: [birth year] MRN: [id number]  Date of Service  09/07/2013   HPI/Events of Note   Pt with persistent hypotension in spite of fluid boluses.  Septic shock from empyema.   eICU Interventions   Will start levophed.   Intervention Category Major Interventions: Other:  Quantavia Frith 09/07/2013, 6:03 PM

## 2013-09-08 ENCOUNTER — Inpatient Hospital Stay (HOSPITAL_COMMUNITY): Payer: Medicare Other

## 2013-09-08 LAB — URINE CULTURE
CULTURE: NO GROWTH
Colony Count: NO GROWTH

## 2013-09-08 LAB — BLOOD GAS, ARTERIAL
Acid-base deficit: 0.6 mmol/L (ref 0.0–2.0)
Bicarbonate: 23.3 mEq/L (ref 20.0–24.0)
DRAWN BY: 40415
FIO2: 0.4 %
MECHVT: 610 mL
O2 Saturation: 94.2 %
PEEP: 5 cmH2O
PH ART: 7.421 (ref 7.350–7.450)
PO2 ART: 76.5 mmHg — AB (ref 80.0–100.0)
PRESSURE SUPPORT: 10 cmH2O
Patient temperature: 98.6
RATE: 14 resp/min
TCO2: 24.4 mmol/L (ref 0–100)
pCO2 arterial: 36.5 mmHg (ref 35.0–45.0)

## 2013-09-08 LAB — CBC WITH DIFFERENTIAL/PLATELET
BASOS PCT: 0 % (ref 0–1)
Basophils Absolute: 0 10*3/uL (ref 0.0–0.1)
EOS ABS: 0 10*3/uL (ref 0.0–0.7)
EOS PCT: 0 % (ref 0–5)
HEMATOCRIT: 23.2 % — AB (ref 39.0–52.0)
HEMOGLOBIN: 8.1 g/dL — AB (ref 13.0–17.0)
Lymphocytes Relative: 13 % (ref 12–46)
Lymphs Abs: 2.9 10*3/uL (ref 0.7–4.0)
MCH: 33.5 pg (ref 26.0–34.0)
MCHC: 34.9 g/dL (ref 30.0–36.0)
MCV: 95.9 fL (ref 78.0–100.0)
Monocytes Absolute: 1.2 10*3/uL — ABNORMAL HIGH (ref 0.1–1.0)
Monocytes Relative: 5 % (ref 3–12)
NEUTROS PCT: 82 % — AB (ref 43–77)
Neutro Abs: 18.4 10*3/uL — ABNORMAL HIGH (ref 1.7–7.7)
Platelets: 331 10*3/uL (ref 150–400)
RBC: 2.42 MIL/uL — ABNORMAL LOW (ref 4.22–5.81)
RDW: 14.1 % (ref 11.5–15.5)
WBC: 22.5 10*3/uL — ABNORMAL HIGH (ref 4.0–10.5)

## 2013-09-08 LAB — PROTIME-INR
INR: 1.23 (ref 0.00–1.49)
PROTHROMBIN TIME: 15.5 s — AB (ref 11.6–15.2)

## 2013-09-08 LAB — PHOSPHORUS: Phosphorus: 3.1 mg/dL (ref 2.3–4.6)

## 2013-09-08 LAB — COMPREHENSIVE METABOLIC PANEL
ALK PHOS: 111 U/L (ref 39–117)
ALT: 19 U/L (ref 0–53)
AST: 45 U/L — ABNORMAL HIGH (ref 0–37)
Albumin: 1.5 g/dL — ABNORMAL LOW (ref 3.5–5.2)
Anion gap: 10 (ref 5–15)
BUN: 10 mg/dL (ref 6–23)
CO2: 22 mEq/L (ref 19–32)
Calcium: 7.7 mg/dL — ABNORMAL LOW (ref 8.4–10.5)
Chloride: 109 mEq/L (ref 96–112)
Creatinine, Ser: 0.79 mg/dL (ref 0.50–1.35)
GFR calc Af Amer: >90 mL/min (ref >90)
GFR calc non Af Amer: >90 mL/min (ref >90)
GLUCOSE: 139 mg/dL — AB (ref 70–99)
POTASSIUM: 4.1 meq/L (ref 3.7–5.3)
SODIUM: 141 meq/L (ref 137–147)
TOTAL PROTEIN: 5.5 g/dL — AB (ref 6.0–8.3)
Total Bilirubin: 0.6 mg/dL (ref 0.3–1.2)

## 2013-09-08 LAB — APTT: aPTT: 44 seconds — ABNORMAL HIGH (ref 24–37)

## 2013-09-08 LAB — VANCOMYCIN, TROUGH: Vancomycin Tr: 12.9 ug/mL (ref 10.0–20.0)

## 2013-09-08 LAB — MAGNESIUM: Magnesium: 2.2 mg/dL (ref 1.5–2.5)

## 2013-09-08 MED ORDER — VANCOMYCIN HCL 10 G IV SOLR
1250.0000 mg | Freq: Three times a day (TID) | INTRAVENOUS | Status: DC
  Administered 2013-09-08 – 2013-09-10 (×5): 1250 mg via INTRAVENOUS
  Filled 2013-09-08 (×7): qty 1250

## 2013-09-08 MED ORDER — SODIUM CHLORIDE 0.9 % IV BOLUS (SEPSIS)
500.0000 mL | Freq: Once | INTRAVENOUS | Status: AC
  Administered 2013-09-08: 500 mL via INTRAVENOUS

## 2013-09-08 MED ORDER — OXYCODONE HCL 5 MG PO TABS
5.0000 mg | ORAL_TABLET | ORAL | Status: DC | PRN
  Administered 2013-09-08: 5 mg via ORAL
  Administered 2013-09-08 – 2013-09-17 (×24): 10 mg via ORAL
  Filled 2013-09-08 (×6): qty 2
  Filled 2013-09-08: qty 1
  Filled 2013-09-08 (×19): qty 2

## 2013-09-08 MED ORDER — BOOST / RESOURCE BREEZE PO LIQD
1.0000 | Freq: Three times a day (TID) | ORAL | Status: DC
  Administered 2013-09-08 – 2013-09-14 (×12): 1 via ORAL
  Administered 2013-09-14: 20:00:00 via ORAL
  Administered 2013-09-15: 1 via ORAL
  Administered 2013-09-16: 21:00:00 via ORAL
  Administered 2013-09-16 – 2013-09-17 (×3): 1 via ORAL

## 2013-09-08 NOTE — Progress Notes (Signed)
Patient ID: Frank Solomon, male   DOB: 1960-03-23, 53 y.o.   MRN: [id number] EVENING ROUNDS NOTE :     [address]       [place] [address]             [phone]                 1 Day Post-Op Procedure(s) (LRB): VIDEO ASSISTED THORACOSCOPY (VATS)/ DRAINAGE OF EMPYEMA/ DECORTICATION (Right)  Total Length of Stay:  LOS: 2 days  BP 106/60  Pulse 118  Temp(Src) 98.3 F (36.8 C) (Oral)  Resp 38  Ht 6' (1.829 m)  Wt 214 lb 15.2 oz (97.5 kg)  BMI 29.15 kg/m2  SpO2 98%  .Intake/Output     08/12 0701 - 08/13 0700   P.O. 900   I.V. (mL/kg) 1324.1 (13.6)   NG/GT 30   IV Piggyback 250   Total Intake(mL/kg) 2504.1 (25.7)   Urine (mL/kg/hr) 580 (0.5)   Emesis/NG output    Blood    Chest Tube 250 (0.2)   Total Output 830   Net +1674.1         . sodium chloride Stopped (09/07/13 0235)  . 0.9 % NaCl with KCl 20 mEq / L 125 mL/hr at 09/07/13 0400  . dexmedetomidine Stopped (09/08/13 1000)  . dextrose 5 % and 0.9 % NaCl with KCl 20 mEq/L 100 mL/hr at 09/08/13 1430  . norepinephrine (LEVOPHED) Adult infusion 2 mcg/min (09/08/13 1800)     Lab Results  Component Value Date   WBC 22.5* 09/08/2013   HGB 8.1* 09/08/2013   HCT 23.2* 09/08/2013   PLT 331 09/08/2013   GLUCOSE 139* 09/08/2013   ALT 19 09/08/2013   AST 45* 09/08/2013   NA 141 09/08/2013   K 4.1 09/08/2013   CL 109 09/08/2013   CREATININE 0.79 09/08/2013   BUN 10 09/08/2013   CO2 22 09/08/2013   INR 1.23 09/08/2013   Stable day 1 post op, c/o chest wall pain  Grace Isaac MD  Beeper [phone] Office [phone] 09/08/2013 7:52 PM

## 2013-09-08 NOTE — Progress Notes (Signed)
Name: Frank Solomon MRN: [id number] DOB: 04-28-1960    ADMISSION DATE:  09/06/2013 CONSULTATION DATE:  09/06/13  REFERRING MD :  [facility] PRIMARY SERVICE:  [facility]  CHIEF COMPLAINT:  Empyema vs abscess  BRIEF PATIENT DESCRIPTION: 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  Has had mild cough, small amount of hemoptysis occasionally.  In ED, CTA chest demonstrated large right empyema vs effusion and 11x64mm RLL loculation, likely abscess.  Seen by CVTS who requested medical admission.  [facility] requested PCCM consult.  SIGNIFICANT EVENTS:  8/10 - admit 8/11 rt vats  STUDIES: 8/10 CXR >>> b/l opacities, 3x4cm oval structure in RLL 8/10 Chest CTA >>> 11 x 52mm right lung base probable abscess with air fluid level, large right loculated pleural effusion vs empyema.  LINES / TUBES: PIV 8/11>> Aline 8/11 >> RIJ CVC 8/11>> Foley 8/11>>  CULTURES: Blood 8/10 >>> Sputum 8/10 >>> Urine 8/10 >>>  ANTIBIOTICS: Vanc 8/10 >>> Zosyn 8/10 >>>  HISTORY OF PRESENT ILLNESS:  Mr. Frank Solomon is a [name] with PMH of CHF and remote TIA, who presented to [facility] ED 8/10 with SOB and right sided chest discomfort x 1 week, worse over past 2 - 3 days.  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  Since that time, he has had occasional chills and a cough that has mostly been non-productive; however, at times he has noticed a small amount of blood.  He denies any fevers/sweats, wheezing, N/V/D, abdominal pain, myalgias. No recent travel or exposure to known sick contacts. Does report smoking history. In ED, WBC's 33K, HR 100-115, RR 30s during exertion / high teens at rest.  CTA revealed probable right lung abscess and empyema as described above.  CVTS was consulted and they requested medical admission.  [facility] did admit pt and  requested PCCM consult.   SUBJECTIVE: Sedated post vats  VITAL SIGNS: Temp:  [97.6 F (36.4 C)-98.1 F (36.7 C)] 98.1 F (36.7 C) (08/12 0400) Pulse Rate:  [76-133] 89 (08/12 0700) Resp:  [0-34] 21 (08/12 0700) BP: (69-173)/(44-90) 102/66 mmHg (08/12 0700) SpO2:  [93 %-100 %] 99 % (08/12 0700) Arterial Line BP: (63-235)/(33-100) 116/60 mmHg (08/12 0700) FiO2 (%):  [40 %-50 %] 40 % (08/12 0700)  PHYSICAL EXAMINATION: General: WDWN male, on ETT Neuro: able to follow commands and move all extremities. RASS 0 HEENT: [place]/AT. PERRL, sclerae anicteric. Cardiovascular: RRR, no M/R/G.  Lungs: Decreased bs rt. 3 cts rt with 20-30 cc/ hour bloody drainage without air leak   Abdomen: BS x 4, soft, NT/ND.  Musculoskeletal: No gross deformities, no edema.  Skin: Rt vats site dressing c d i.   Recent Labs Lab 09/07/13 0216 09/07/13 0829 09/08/13 0400  NA 136* 139 141  K 3.3* 3.5* 4.1  CL 102 104 109  CO2 20 20 22   BUN 10 9 10   CREATININE 0.90 0.85 0.79  GLUCOSE 155* 113* 139*    Recent Labs Lab 09/07/13 0216 09/07/13 0829 09/08/13 0400  HGB 9.7* 10.7* 8.1*  HCT 28.3* 30.9* 23.2*  WBC 32.7* 31.7* 22.5*  PLT 416* 420* 331   Chest 2 View  09/07/2013   CLINICAL DATA:  Preoperative evaluation. Draining abscess. Lung infection.  EXAM: CHEST  2 VIEW  COMPARISON:  Chest radiograph 09/06/2013; chest  CT 09/06/2013.  FINDINGS: Stable enlarged cardiac and mediastinal contours with leftward mediastinal shift. Re- demonstrated air-fluid level within the right lower hemithorax compatible with probable large loculated right pleural effusion/empyema. Underlying right lung base consolidative opacities. Small left pleural effusion.  IMPRESSION: Re- demonstrated large right pleural effusion with air-fluid level and underlying consolidative opacities. Opacities are concerning for infection. Large right pleural fluid collection may represent empyema. Gas may be secondary to infection or bronchopleural  fistula.   Electronically Signed   By: Lovey Newcomer M.D.   On: 09/07/2013 09:00   Ct Angio Chest Pe W/cm &/or Wo Cm  09/06/2013   CLINICAL DATA:  Chest pain after lifting injury.  New onset cough.  EXAM: CT ANGIOGRAPHY CHEST WITH CONTRAST  TECHNIQUE: Multidetector CT imaging of the chest was performed using the standard protocol during bolus administration of intravenous contrast. Multiplanar CT image reconstructions and MIPs were obtained to evaluate the vascular anatomy.  CONTRAST:  37mL OMNIPAQUE IOHEXOL 350 MG/ML SOLN  COMPARISON:  Chest radiograph September 06, 2013  FINDINGS: Adequate pulmonary arterial contrast opacification. No pulmonary arterial filling defects to the level of the subsegmental branches.  At least 11 x 12 cm right lower lobe air-fluid level, with thick rind of low-density presumed necrotic tissue, appearing extrapleural with enhancing atelectasis anteriorly, surrounding ground-glass nodules and loculated component. Low-density component along the anterior pleural surface of the right upper lobe. Small left pleural effusion.  12 mm short axis pretracheal, 14 mm short axis subcarinal lymph node. Inflammatory changes of the cardiophrenic angle with probable lymphadenopathy. Tracheobronchial tree is patent and midline. No pneumothorax.  Right lung process mildly displaces the heart to the left. Pericardium is nonsuspicious. Thoracic aorta is normal in course and caliber with mild calcific atherosclerosis.  Included view of the abdomen is unremarkable. Soft tissues are nonsuspicious. No acute osseous process.  Review of the MIP images confirms the above findings.  IMPRESSION: No acute pulmonary embolism.  At least 11 x 12 mm right lung base probable abscess with air-fluid level (which can also be seen with bronchopleural fistula), in addition to large right loculated pleural effusion/empyema. Associated ground-glass opacities are probably infectious. Mediastinal lymphadenopathy is likely reactive.  Right lung process results in mild mediastinal shift to the left.  Small left pleural effusion.  Findings discussed with and reconfirmed by Dr.EMILY WEST on8/10/2015at11:09 pm.   Electronically Signed   By: Elon Alas   On: 09/06/2013 23:13   Dg Chest Port 1 View  09/07/2013   CLINICAL DATA:  Endotracheal and chest tube placement. Video-assisted thoracoscopy with drainage of empyema and decortication, right chest. Postop day 0  EXAM: PORTABLE CHEST - 1 VIEW  COMPARISON:  09/07/2013  FINDINGS: Endotracheal tube tip: 2.7 cm above the carina. Right IJ line tip: SVC.  Multiple right-sided chest tubes are present. Small right basilar pneumothorax peripherally. There is fluid in the minor fissure. Reduced atelectasis at the right lung base, with some underlying interstitial and patchy airspace opacity in the right lung.  Suspected mild atelectasis in the left upper lobe. Mildly enlarged cardiopericardial silhouette. Significantly reduced right pleural effusion. Mildly blunted left lateral costophrenic angle.  IMPRESSION: 1. Interval right empyema drainage and decortication, with small right peripheral basilar pneumothorax, significant decrease in right pleural effusion and right basilar atelectasis, and multiple chest tubes in place. There is some patchy interstitial and airspace opacity in the right lung. 2. Mild atelectasis in the left upper lobe. 3. Mildly enlarged cardiopericardial silhouette. 4. Tubes and lines appear satisfactorily  positioned.   Electronically Signed   By: Sherryl Barters M.D.   On: 09/07/2013 14:13   Dg Chest Port 1 View  09/06/2013   CLINICAL DATA:  53 year old male with shortness of breath and respiratory distress  EXAM: PORTABLE CHEST - 1 VIEW  COMPARISON:  01/04/1998 chest radiograph  FINDINGS: Pulmonary vascular congestion is noted.  Opacities overlying both lower lungs, right greater than left, probably represents effusion and atelectasis/airspace disease.  A 3 x 4 cm oval  structure overlying the right lower lung could represent a focal mass.  No acute bony abnormalities are noted.  There is no evidence of pneumothorax.  IMPRESSION: Bilateral lower lung opacities, right greater than left, likely representing pleural effusions and atelectasis/airspace disease.  3 x 4 cm oval structure overlying the right lower lung could represent focal mass. Radiographic follow-up recommended.  Pulmonary vascular congestion.   Electronically Signed   By: Hassan Rowan M.D.   On: 09/06/2013 20:48    ASSESSMENT / PLAN:  Right Empyema vs Effusion Probable Abscess Sepsis - due to above Acute hypoxic respiratory failure Tobacco use disorder Post Vats 8/11 with Rt CT x 3 Recs: ICU and PCCM assume care in ICU. Wean from vent in am. Vent bundle Pain control Abx. DC antihypertensives Vats per CVTS Pain control with fentanyl- opioids cause hypotension and resulted in use of pressors Extubate today. IS and flutter valve post. Ambulate post extubation. OOB to chair.   CC time 35 min.  Patient seen and examined, agree with above note.  I dictated the care and orders written for this patient under my direction.  Rush Farmer, MD [phone]

## 2013-09-08 NOTE — Procedures (Signed)
Extubation Procedure Note  Patient Details:   Name: Frank Solomon DOB: 05-Mar-1960 MRN: [id number]   Pt extubated to 4L [place] per MD order. VS WNL, pt able to vocalize, no stridor noted. Pt tolerating well at this time, RT to monitor.    Evaluation  O2 sats: stable throughout Complications: No apparent complications Patient did tolerate procedure well. Bilateral Breath Sounds: Diminished Suctioning: Airway Yes  Jetty Peeks 09/08/2013, 10:21 AM

## 2013-09-08 NOTE — Progress Notes (Signed)
INITIAL NUTRITION ASSESSMENT  DOCUMENTATION CODES Per approved criteria  -Not Applicable   INTERVENTION: Boost Breeze po TID, each supplement provides 250 kcal and 9 grams of protein RD to follow for nutrition care plan  NUTRITION DIAGNOSIS: Increased nutrient needs related to post-op healing as evidenced by estimated nutrition needs  Goal: Pt to meet >/= 90% of their estimated nutrition needs   Monitor:  PO & supplemental intake, weight, labs, I/O's  Reason for Assessment: Malnutrition Screening Tool Report  53 y.o. male  Admitting Dx: empyema with probable RLL abscess  ASSESSMENT: 53 y.o. year old male with PMH of tobacco abuse, CHF, hx/o TIA presenting with acute resp failure. CT angio of the chest shows at least 11x12 cm RLL air fluid level of presumed necrotic tissue in addition to R loculated empyema with infectious ground glass opacities and reactive mediastinal LAD. + mild mediastinal L shift.   Patient s/p procedure 8/11: VIDEO ASSISTED THORACOSCOPY (VATS)/ DRAINAGE OF EMPYEMA/ DECORTICATION (Right)  Patient reports a decreased appetite for the past 10 days; states he's been consuming smaller portions throughout the day (for example: spoonful of eggs and couple of oatmeal cookies); no recent unintentional weight loss; would benefit from addition of oral nutrition supplement; amenable to trying Resource Breeze ([employer]); RD to order.  No muscle or subcutaneous fat depletion noticed.  Height: Ht Readings from Last 1 Encounters:  09/07/13 6' (1.829 m)    Weight: Wt Readings from Last 1 Encounters:  09/07/13 214 lb 15.2 oz (97.5 kg)    Ideal Body Weight: 178 lb  % Ideal Body Weight: 120%  Wt Readings from Last 10 Encounters:  09/07/13 214 lb 15.2 oz (97.5 kg)  09/07/13 214 lb 15.2 oz (97.5 kg)    Usual Body Weight: 215 lb  % Usual Body Weight: 99%  BMI:  Body mass index is 29.15 kg/(m^2).  Estimated Nutritional Needs: Kcal:  2100-2300 Protein: 110-120 gm Fluid: 2.1-2.3 L  Skin: chest surgical incision   Diet Order: Clear Liquid  EDUCATION NEEDS: -No education needs identified at this time   Intake/Output Summary (Last 24 hours) at 09/08/13 1348 Last data filed at 09/08/13 1300  Gross per 24 hour  Intake 3825.36 ml  Output   2795 ml  Net 1030.36 ml   Labs:   Recent Labs Lab 09/06/13 2109 09/07/13 0216 09/07/13 0829 09/08/13 0400  NA 137 136* 139 141  K 3.3* 3.3* 3.5* 4.1  CL 98 102 104 109  CO2 21 20 20 22   BUN 10 10 9 10   CREATININE 0.92 0.90 0.85 0.79  CALCIUM 8.4 7.8* 8.2* 7.7*  MG  --  2.0  --  2.2  PHOS  --   --   --  3.1  GLUCOSE 118* 155* 113* 139*    Scheduled Meds: . acetaminophen  1,000 mg Oral 4 times per day   Or  . acetaminophen (TYLENOL) oral liquid 160 mg/5 mL  1,000 mg Oral 4 times per day  . antiseptic oral rinse  7 mL Mouth Rinse QID  . bisacodyl  10 mg Oral Daily  . chlorhexidine  15 mL Mouth Rinse BID  . enoxaparin (LOVENOX) injection  40 mg Subcutaneous Daily  . ipratropium-albuterol  3 mL Nebulization Q4H  . lisinopril  40 mg Oral Daily  . pantoprazole (PROTONIX) IV  40 mg Intravenous Q24H  . piperacillin-tazobactam (ZOSYN)  IV  3.375 g Intravenous 3 times per day  . senna-docusate  1 tablet Oral QHS  . sodium  chloride  3 mL Intravenous Q12H  . vancomycin  1,000 mg Intravenous Q8H    Continuous Infusions: . sodium chloride Stopped (09/07/13 0235)  . 0.9 % NaCl with KCl 20 mEq / L 125 mL/hr at 09/07/13 0400  . dexmedetomidine Stopped (09/08/13 1000)  . dextrose 5 % and 0.9 % NaCl with KCl 20 mEq/L 100 mL/hr at 09/08/13 0700  . norepinephrine (LEVOPHED) Adult infusion 2 mcg/min (09/08/13 1330)    Past Medical History  Diagnosis Date  . TIA (transient ischemic attack)   . CHF (congestive heart failure)   . 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 No pertinent past surgical history.  Arthur Holms,  RD, LDN Pager #: [phone] After-Hours Pager #: [phone]

## 2013-09-08 NOTE — Progress Notes (Addendum)
      [address]       [place] [address]             [phone]      1 Day Post-Op Procedure(s) (LRB): VIDEO ASSISTED THORACOSCOPY (VATS)/ DRAINAGE OF EMPYEMA/ DECORTICATION (Right)  Subjective:  Frank Solomon has no acute complaints this morning.  He states his pain is well controlled with use of PCA.    Objective: Vital signs in last 24 hours: Temp:  [97.6 F (36.4 C)-98.1 F (36.7 C)] 98 F (36.7 C) (08/12 0826) Pulse Rate:  [76-133] 100 (08/12 1010) Cardiac Rhythm:  [-] Normal sinus rhythm (08/12 1000) Resp:  [0-34] 26 (08/12 1010) BP: (69-173)/(44-90) 124/64 mmHg (08/12 1010) SpO2:  [93 %-100 %] 98 % (08/12 1125) Arterial Line BP: (63-235)/(33-100) 112/58 mmHg (08/12 1030) FiO2 (%):  [40 %-50 %] 40 % (08/12 0831)  Hemodynamic parameters for last 24 hours: CVP:  [4 mmHg-13 mmHg] 4 mmHg  Intake/Output from previous day: 08/11 0701 - 08/12 0700 In: 4343.8 [I.V.:3468.8; NG/GT:150; IV Piggyback:725] Out: 3040 [Urine:1850; Emesis/NG output:50; Blood:500; Chest Tube:640] Intake/Output this shift: Total I/O In: 364.1 [I.V.:334.1; NG/GT:30] Out: 400 [Urine:300; Chest Tube:100]  General appearance: alert, cooperative and no distress Heart: regular rate and rhythm Lungs: diminished breath sounds bibasilar and coarse throughout Abdomen: soft, non-tender; bowel sounds normal; no masses,  no organomegaly Wound: clean and dry  Lab Results:  Recent Labs  09/07/13 0829 09/08/13 0400  WBC 31.7* 22.5*  HGB 10.7* 8.1*  HCT 30.9* 23.2*  PLT 420* 331   BMET:  Recent Labs  09/07/13 0829 09/08/13 0400  NA 139 141  K 3.5* 4.1  CL 104 109  CO2 20 22  GLUCOSE 113* 139*  BUN 9 10  CREATININE 0.85 0.79  CALCIUM 8.2* 7.7*    PT/INR:  Recent Labs  09/08/13 0400  LABPROT 15.5*  INR 1.23   ABG    Component Value Date/Time   PHART 7.421 09/08/2013 0502   HCO3 23.3 09/08/2013 0502   TCO2 24.4 09/08/2013 0502   ACIDBASEDEF 0.6 09/08/2013 0502   O2SAT 94.2 09/08/2013 0502   CBG (last 3)  No results found for this basename: GLUCAP,  in the last 72 hours  Assessment/Plan: S/P Procedure(s) (LRB): VIDEO ASSISTED THORACOSCOPY (VATS)/ DRAINAGE OF EMPYEMA/ DECORTICATION (Right)  1. Empyema- OR gram stains show mixed organisms, cultures remain pending, continue current ABX 2. Chest tube- no air leak present, 640 cc output yesterday, CXR shows no pneumothorax, + atelectasis, lower lobe collapse continue pulm toilet, flutter valve 3. D/C Arterial Line 4. Advance diet as tolerated 5. Pain control- good use of PCA, will add Oxy IR 6. Dispo- patient stable, care per primary    LOS: 2 days    BARRETT, ERIN 09/08/2013  Patient extubated and appears to be progressing well despite his perioperative sepsis. All chest drains will be left in place and will try to mobilize patient out of bed to chair. Chest x-ray in a.m.

## 2013-09-08 NOTE — Progress Notes (Addendum)
ANTIBIOTIC CONSULT NOTE - INITIAL  Pharmacy Consult for Vancomycin and Zosyn  Indication: Empyema  No Known Allergies  Patient Measurements: Height: 6' (182.9 cm) Weight: 214 lb 15.2 oz (97.5 kg) IBW/kg (Calculated) : 77.6  Vital Signs: Temp: 97.6 F (36.4 C) (08/12 1152) Temp src: Oral (08/12 1152) BP: 100/68 mmHg (08/12 1200) Pulse Rate: 98 (08/12 1200) Intake/Output from previous day: 08/11 0701 - 08/12 0700 In: 4343.8 [I.V.:3468.8; NG/GT:150; IV Piggyback:725] Out: 3040 [Urine:1850; Emesis/NG output:50; Blood:500; Chest Tube:640] Intake/Output from this shift: Total I/O In: 924.1 [P.O.:360; I.V.:534.1; NG/GT:30] Out: 525 [Urine:375; Chest Tube:150]  Labs:  Recent Labs  09/07/13 0216 09/07/13 0829 09/08/13 0400  WBC 32.7* 31.7* 22.5*  HGB 9.7* 10.7* 8.1*  PLT 416* 420* 331  CREATININE 0.90 0.85 0.79   Estimated Creatinine Clearance: 130.8 ml/min (by C-G formula based on Cr of 0.79). No results found for this basename: VANCOTROUGH, VANCOPEAK, VANCORANDOM, [place], GENTPEAK, GENTRANDOM, [place], TOBRAPEAK, TOBRARND, AMIKACINPEAK, AMIKACINTROU, AMIKACIN,  in the last 72 hours   Microbiology: Recent Results (from the past 720 hour(s))  CULTURE, BLOOD (ROUTINE X 2)     Status: None   Collection Time    09/06/13  8:59 PM      Result Value Ref Range Status   Specimen Description BLOOD HAND RIGHT   Final   Special Requests BOTTLES DRAWN AEROBIC AND ANAEROBIC 5CC   Final   Culture  Setup Time     Final   Value: 09/07/2013 03:14     Performed at [facility]   Culture     Final   Value:        BLOOD CULTURE RECEIVED NO GROWTH TO DATE CULTURE WILL BE HELD FOR 5 DAYS BEFORE ISSUING A FINAL NEGATIVE REPORT     Performed at [facility]   Report Status PENDING   Incomplete  CULTURE, BLOOD (ROUTINE X 2)     Status: None   Collection Time    09/06/13  9:23 PM      Result Value Ref Range Status   Specimen Description BLOOD FOREARM RIGHT   Final   Special Requests BOTTLES DRAWN AEROBIC ONLY 2CC   Final   Culture  Setup Time     Final   Value: 09/07/2013 03:15     Performed at [facility]   Culture     Final   Value:        BLOOD CULTURE RECEIVED NO GROWTH TO DATE CULTURE WILL BE HELD FOR 5 DAYS BEFORE ISSUING A FINAL NEGATIVE REPORT     Performed at [facility]   Report Status PENDING   Incomplete  URINE CULTURE     Status: None   Collection Time    09/07/13  1:21 AM      Result Value Ref Range Status   Specimen Description URINE, CLEAN CATCH   Final   Special Requests NONE   Final   Culture  Setup Time     Final   Value: 09/07/2013 04:28     Performed at [place]     Final   Value: NO GROWTH     Performed at [facility]   Culture     Final   Value: NO GROWTH     Performed at [facility]   Report Status 09/08/2013 FINAL   Final  MRSA PCR SCREENING     Status: None   Collection Time    09/07/13  2:00  AM      Result Value Ref Range Status   MRSA by PCR NEGATIVE  NEGATIVE Final   Comment:            The GeneXpert MRSA Assay (FDA     approved for NASAL specimens     only), is one component of a     comprehensive MRSA colonization     surveillance program. It is not     intended to diagnose MRSA     infection nor to guide or     monitor treatment for     MRSA infections.  BODY FLUID CULTURE     Status: None   Collection Time    09/07/13 10:29 AM      Result Value Ref Range Status   Specimen Description FLUID RIGHT PLEURAL   Final   Special Requests POF VANCOMYCIN   Final   Gram Stain PENDING   Incomplete   Culture     Final   Value: NO GROWTH 1 DAY     Performed at [facility]   Report Status PENDING   Incomplete  TISSUE CULTURE     Status: None   Collection Time    09/07/13 10:30 AM      Result Value Ref Range Status   Specimen Description TISSUE RIGHT PLEURAL   Final   Special Requests POF ON VANCOMYCIN RIGHT PLEURAL PEEL   Final   Gram  Stain     Final   Value: RARE WBC PRESENT, PREDOMINANTLY MONONUCLEAR     NO SQUAMOUS EPITHELIAL CELLS SEEN     ABUNDANT GRAM POSITIVE RODS     IN PAIRS ABUNDANT GRAM NEGATIVE RODS     FEW GRAM POSITIVE RODS   Culture     Final   Value: NO GROWTH 1 DAY     Performed at [facility]   Report Status PENDING   Incomplete  ANAEROBIC CULTURE     Status: None   Collection Time    09/07/13 10:30 AM      Result Value Ref Range Status   Specimen Description TISSUE RIGHT PLEURAL   Final   Special Requests POF ON VANCOMYCIN RIGHT PLEURAL PEEL   Final   Gram Stain     Final   Value: RARE WBC PRESENT, PREDOMINANTLY MONONUCLEAR     NO SQUAMOUS EPITHELIAL CELLS SEEN     ABUNDANT GRAM POSITIVE RODS     IN PAIRS ABUNDANT GRAM NEGATIVE RODS     FEW GRAM POSITIVE RODS   Culture     Final   Value: NO ANAEROBES ISOLATED; CULTURE IN PROGRESS FOR 5 DAYS     Performed at [facility]   Report Status PENDING   Incomplete  TISSUE CULTURE     Status: None   Collection Time    09/07/13 11:10 AM      Result Value Ref Range Status   Specimen Description TISSUE RIGHT PLEURAL   Final   Special Requests POF VANCOMYCIN RIGHT VISCERAL PLEURAL PEEL   Final   Gram Stain PENDING   Incomplete   Culture     Final   Value: NO GROWTH 1 DAY     Performed at [facility]   Report Status PENDING   Incomplete  ANAEROBIC CULTURE     Status: None   Collection Time    09/07/13 11:10 AM      Result Value Ref Range Status   Specimen Description TISSUE RIGHT PLEURAL  Final   Special Requests POF VANCOMYCIN RIGHT VISERSL PLEURAL PEEL   Final   Gram Stain PENDING   Incomplete   Culture     Final   Value: NO ANAEROBES ISOLATED; CULTURE IN PROGRESS FOR 5 DAYS     Performed at [facility]   Report Status PENDING   Incomplete    Assessment: 53 yo male admitted 09/06/2013  with SOB/lung abcess for empiric antibiotics.  Pharmacy consulted to dose Vancomycin and Zosyn.  Infectious Disease:  Empyema & suspected abscess s/p VATS 8/11. Foul smelling drainage- likely anareobic.  Tm 99.9. MRSA PCR neg. WBC up but trend down *8/11- Received Vanc 1g @ 0058AM & 0553 AM. Zosyn @ [phone] - Then to OR.   CTX 8/10 >> 8/11 Azith 8/10 >> 8/11 Vanc 8/11 >> Zosyn 8/11 >>  8/10 Bcx >> NGTD 8/11 Ucx >>NEG 8/11 Pleural fluid cx >> GPR, GNR,    Goal of Therapy:  Vancomycin trough level 15-20 mcg/ml  Plan:  Contineu 1 g IV q8h & Zosyn 3.375 g IV q8h  Follow up Vancomycin trough prior to next dose. Follow up SCr, UOP, cultures, clinical course and adjust as clinically indicated.  Thank you for allowing pharmacy to be a part of this patients care team.  Rowe Robert Pharm.D., BCPS, AQ-Cardiology Clinical Pharmacist 09/08/2013 12:52 PM Pager: [phone] Phone: [phone]   ----------------------------------------------------------------------------------------- Addendum:  Assessment: The patient's Vancomycin trough this afternoon resulted as SUBtherapeutic (VT 12.9 mcg/ml, goal of 15-20 mcg/ml). Renal function remains stable - will increase the dose to achieve the target goal range.  Plan:  1. Increase Vancomycin to 1250 mg IV every 8 hours 2. Will continue to follow renal function, culture results, LOT, and antibiotic de-escalation plans   Alycia Rossetti, PharmD, BCPS Clinical Pharmacist Pager: [phone] 09/08/2013 3:57 PM

## 2013-09-08 NOTE — Progress Notes (Signed)
Patient has c/o pain that started in his right chest but then began to radiate to his abdomen. Abdomen remained soft but was tender to touch. He described it as throbbing and he was unable to get comfortable while in bed. Multiple PRN pain medication administrations given to help with relief. Asked to get patient out of bed to the chair to see if repositioning would help, when doing so he was able to burp and expressed relief instantaneously. After sitting in the chair for the last hour, patient has expressed that he has had significant relief and abdomen in no longer tender to touch. Passed on to [facility] RN. Richardean Sale, RN

## 2013-09-08 NOTE — Progress Notes (Signed)
[facility] Progress Note Patient Name: Frank Solomon DOB: 1960-10-13 MRN: [id number]   Date of Service  09/08/2013  HPI/Events of Note   Oliguria.   eICU Interventions   Will give fluid bolus 500 ml NS.      Intervention Category Major Interventions: Other:  Nida Manfredi 09/08/2013, 10:45 PM

## 2013-09-09 ENCOUNTER — Encounter (HOSPITAL_COMMUNITY): Payer: Self-pay | Admitting: Thoracic Surgery (Cardiothoracic Vascular Surgery)

## 2013-09-09 ENCOUNTER — Inpatient Hospital Stay (HOSPITAL_COMMUNITY): Payer: Medicare Other

## 2013-09-09 LAB — CBC WITH DIFFERENTIAL/PLATELET
BASOS ABS: 0 10*3/uL (ref 0.0–0.1)
Basophils Relative: 0 % (ref 0–1)
Eosinophils Absolute: 0 10*3/uL (ref 0.0–0.7)
Eosinophils Relative: 0 % (ref 0–5)
HCT: 25 % — ABNORMAL LOW (ref 39.0–52.0)
Hemoglobin: 8.5 g/dL — ABNORMAL LOW (ref 13.0–17.0)
LYMPHS ABS: 3.6 10*3/uL (ref 0.7–4.0)
Lymphocytes Relative: 21 % (ref 12–46)
MCH: 33.5 pg (ref 26.0–34.0)
MCHC: 34 g/dL (ref 30.0–36.0)
MCV: 98.4 fL (ref 78.0–100.0)
MONO ABS: 1.4 10*3/uL — AB (ref 0.1–1.0)
Monocytes Relative: 8 % (ref 3–12)
Neutro Abs: 11.9 10*3/uL — ABNORMAL HIGH (ref 1.7–7.7)
Neutrophils Relative %: 71 % (ref 43–77)
PLATELETS: 391 10*3/uL (ref 150–400)
RBC: 2.54 MIL/uL — AB (ref 4.22–5.81)
RDW: 14 % (ref 11.5–15.5)
WBC: 16.9 10*3/uL — ABNORMAL HIGH (ref 4.0–10.5)

## 2013-09-09 LAB — COMPREHENSIVE METABOLIC PANEL
ALT: 19 U/L (ref 0–53)
AST: 48 U/L — ABNORMAL HIGH (ref 0–37)
Albumin: 1.6 g/dL — ABNORMAL LOW (ref 3.5–5.2)
Alkaline Phosphatase: 82 U/L (ref 39–117)
Anion gap: 11 (ref 5–15)
BUN: 11 mg/dL (ref 6–23)
CO2: 21 mEq/L (ref 19–32)
CREATININE: 0.94 mg/dL (ref 0.50–1.35)
Calcium: 7.7 mg/dL — ABNORMAL LOW (ref 8.4–10.5)
Chloride: 106 mEq/L (ref 96–112)
GLUCOSE: 119 mg/dL — AB (ref 70–99)
Potassium: 3.6 mEq/L — ABNORMAL LOW (ref 3.7–5.3)
Sodium: 138 mEq/L (ref 137–147)
Total Bilirubin: 0.5 mg/dL (ref 0.3–1.2)
Total Protein: 5.8 g/dL — ABNORMAL LOW (ref 6.0–8.3)

## 2013-09-09 LAB — MAGNESIUM: Magnesium: 2.2 mg/dL (ref 1.5–2.5)

## 2013-09-09 LAB — PHOSPHORUS: PHOSPHORUS: 2.6 mg/dL (ref 2.3–4.6)

## 2013-09-09 MED ORDER — POTASSIUM CHLORIDE CRYS ER 20 MEQ PO TBCR
40.0000 meq | EXTENDED_RELEASE_TABLET | Freq: Three times a day (TID) | ORAL | Status: AC
  Administered 2013-09-09 (×2): 40 meq via ORAL
  Filled 2013-09-09 (×2): qty 2

## 2013-09-09 MED ORDER — FUROSEMIDE 10 MG/ML IJ SOLN
40.0000 mg | Freq: Four times a day (QID) | INTRAMUSCULAR | Status: AC
Start: 2013-09-09 — End: 2013-09-09
  Administered 2013-09-09 (×3): 40 mg via INTRAVENOUS
  Filled 2013-09-09 (×3): qty 4

## 2013-09-09 MED ORDER — POTASSIUM CHLORIDE 10 MEQ/50ML IV SOLN
10.0000 meq | INTRAVENOUS | Status: AC
  Administered 2013-09-09 (×3): 10 meq via INTRAVENOUS
  Filled 2013-09-09: qty 50

## 2013-09-09 NOTE — Progress Notes (Signed)
2 Days Post-Op Procedure(s) (LRB): VIDEO ASSISTED THORACOSCOPY (VATS)/ DRAINAGE OF EMPYEMA/ DECORTICATION (Right) Subjective: Some incisional pain breathing better than preop  Objective: Vital signs in last 24 hours: Temp:  [97.6 F (36.4 C)-98.5 F (36.9 C)] 98.2 F (36.8 C) (08/13 0347) Pulse Rate:  [92-123] 123 (08/13 0600) Cardiac Rhythm:  [-] Normal sinus rhythm (08/13 0700) Resp:  [22-39] 29 (08/13 0600) BP: (86-151)/(60-77) 130/74 mmHg (08/13 0500) SpO2:  [93 %-100 %] 93 % (08/13 0600) Arterial Line BP: (75-145)/(48-71) 145/63 mmHg (08/13 0500) FiO2 (%):  [40 %] 40 % (08/12 0831) Weight:  [239 lb 3.2 oz (108.5 kg)] 239 lb 3.2 oz (108.5 kg) (08/13 0500)  Hemodynamic parameters for last 24 hours: CVP:  [4 mmHg-6 mmHg] 6 mmHg  Intake/Output from previous day: 08/12 0701 - 08/13 0700 In: 4276.6 [P.O.:900; I.V.:2446.6; NG/GT:30; IV Piggyback:900] Out: 1696 [Urine:1165; Chest Tube:390] Intake/Output this shift:    General appearance: alert and no distress Neurologic: intact Heart: tachy, regular Lungs: diminished breath sounds bibasilar and right > left Wound: clean CT- no air leak, serosanguinous drainage  Lab Results:  Recent Labs  09/08/13 0400 09/09/13 0440  WBC 22.5* 16.9*  HGB 8.1* 8.5*  HCT 23.2* 25.0*  PLT 331 391   BMET:  Recent Labs  09/08/13 0400 09/09/13 0440  NA 141 138  K 4.1 3.6*  CL 109 106  CO2 22 21  GLUCOSE 139* 119*  BUN 10 11  CREATININE 0.79 0.94  CALCIUM 7.7* 7.7*    PT/INR:  Recent Labs  09/08/13 0400  LABPROT 15.5*  INR 1.23   ABG    Component Value Date/Time   PHART 7.421 09/08/2013 0502   HCO3 23.3 09/08/2013 0502   TCO2 24.4 09/08/2013 0502   ACIDBASEDEF 0.6 09/08/2013 0502   O2SAT 94.2 09/08/2013 0502   CBG (last 3)  No results found for this basename: GLUCAP,  in the last 72 hours  Assessment/Plan: S/P Procedure(s) (LRB): VIDEO ASSISTED THORACOSCOPY (VATS)/ DRAINAGE OF EMPYEMA/ DECORTICATION (Right) - POD  # 2 decortication for empyema- likely secondary to ruptured lung abscess  Multiple organisms on GS, cultures pending On Vanco/ zosyn CXR looks good No air leak- dc middle CT, leave Blake drains until drainage stops SCD + enoxaparin for DVT prophylaxis Ambulate Probably ready to transfer to 3300- will defer to primary service    LOS: 3 days    Frank Solomon C 09/09/2013

## 2013-09-09 NOTE — Evaluation (Signed)
Physical Therapy Evaluation Patient Details Name: Frank Solomon MRN: [id number] DOB: 11/16/1960 Today's Date: 09/09/2013   History of Present Illness  Pt adm with rt empyema and underwent rt VATS and decortication on 09/07/13. PMH-CHF, TIA  Clinical Impression  Pt admitted with above. Pt currently with functional limitations due to the deficits listed below (see PT Problem List).  Pt will benefit from skilled PT to increase their independence and safety with mobility to allow discharge home. Do not anticipated any follow up PT needs.     Follow Up Recommendations No PT follow up    Equipment Recommendations  None recommended by PT    Recommendations for Other Services       Precautions / Restrictions Precautions Precaution Comments: lines/tubes      Mobility  Bed Mobility Overal bed mobility: Needs Assistance Bed Mobility: Sit to Supine       Sit to supine: +2 for physical assistance;Min assist   General bed mobility comments: Assist to lower trunk and to bring legs up into bed.  Transfers Overall transfer level: Needs assistance Equipment used: Pushed w/c Transfers: Sit to/from Stand Sit to Stand: Min assist;+2 safety/equipment         General transfer comment: Assist to bring hips up and to control descent.  Ambulation/Gait Ambulation/Gait assistance: Min guard Ambulation Distance (Feet): 250 Feet Assistive device:  (pushing w/c) Gait Pattern/deviations: Step-through pattern;Decreased step length - right;Decreased step length - left Gait velocity: slow Gait velocity interpretation: Below normal speed for age/gender General Gait Details: Verbal cues initially to stand more erect.  Stairs            Wheelchair Mobility    Modified Rankin (Stroke Patients Only)       Balance Overall balance assessment: No apparent balance deficits (not formally assessed)                                           Pertinent Vitals/Pain Pain  Assessment: No/denies pain    Home Living Family/patient expects to be discharged to:: Private residence Living Arrangements: Alone Available Help at Discharge: Family;Available PRN/intermittently   Home Access: Level entry     Home Layout: Two level Home Equipment: None      Prior Function Level of Independence: Independent               Hand Dominance        Extremity/Trunk Assessment   Upper Extremity Assessment: Overall WFL for tasks assessed           Lower Extremity Assessment: Generalized weakness         Communication   Communication: No difficulties  Cognition Arousal/Alertness: Awake/alert Behavior During Therapy: WFL for tasks assessed/performed Overall Cognitive Status: Within Functional Limits for tasks assessed                      General Comments      Exercises        Assessment/Plan    PT Assessment Patient needs continued PT services  PT Diagnosis Difficulty walking;Generalized weakness   PT Problem List Decreased strength;Decreased activity tolerance;Decreased balance;Decreased mobility  PT Treatment Interventions DME instruction;Gait training;Stair training;Functional mobility training;Therapeutic activities;Therapeutic exercise;Patient/family education   PT Goals (Current goals can be found in the Care Plan section) Acute Rehab PT Goals Patient Stated Goal: Return home PT Goal Formulation: With patient Time For Goal Achievement:  09/16/13 Potential to Achieve Goals: Good    Frequency Min 3X/week   Barriers to discharge        Co-evaluation               End of Session Equipment Utilized During Treatment: Oxygen Activity Tolerance: Patient tolerated treatment well Patient left: in bed;with call bell/phone within reach;with family/visitor present           Time: 0925-0946 PT Time Calculation (min): 21 min   Charges:   PT Evaluation $Initial PT Evaluation Tier I: 1 Procedure PT  Treatments $Gait Training: 8-22 mins   PT G Codes:          Kobie Whidby Sep 17, 2013, 10:17 AM  Suanne Marker PT [phone]

## 2013-09-09 NOTE — Progress Notes (Signed)
CT surgery p.m. Rounds Patient examined and record reviewed.Hemodynamics stable,labs satisfactory.Patient had stable day.Continue current care. VAN TRIGT III,PETER 09/09/2013

## 2013-09-09 NOTE — Progress Notes (Signed)
Name: Frank Solomon MRN: [id number] DOB: 1960-12-15    ADMISSION DATE:  09/06/2013 CONSULTATION DATE:  09/06/13  REFERRING MD :  [facility] PRIMARY SERVICE:  TRH  CHIEF COMPLAINT:  Empyema vs abscess  BRIEF PATIENT DESCRIPTION: 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  Has had mild cough, small amount of hemoptysis occasionally.  In ED, CTA chest demonstrated large right empyema vs effusion and 11x76mm RLL loculation, likely abscess.  Seen by CVTS who requested medical admission.  TRH requested PCCM consult.  SIGNIFICANT EVENTS:  8/10 - admit 8/11 rt vats 8/12 extubated  STUDIES: 8/10 CXR >>> b/l opacities, 3x4cm oval structure in RLL 8/10 Chest CTA >>> 11 x 15mm right lung base probable abscess with air fluid level, large right loculated pleural effusion vs empyema.  LINES / TUBES: PIV 8/11>> Aline 8/11 >>8/12 RIJ CVC 8/11>> Foley 8/11>>  CULTURES: Blood 8/10 >>> NGTD Sputum 8/10 >>> Urine 8/10 >>>NG Tissue right pleural 8/11>> abundant Gram pos rods in pairs  ANTIBIOTICS: Vanc 8/10 >>> Zosyn 8/10 >>>  HISTORY OF PRESENT ILLNESS:  Frank Solomon is a 52 y.o. M with PMH of CHF and remote TIA, who presented to [facility] ED 8/10 with SOB and right sided chest discomfort x 1 week, worse over past 2 - 3 days.  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  Since that time, he has had occasional chills and a cough that has mostly been non-productive; however, at times he has noticed a small amount of blood.  He denies any fevers/sweats, wheezing, N/V/D, abdominal pain, myalgias. No recent travel or exposure to known sick contacts. Does report smoking history. In ED, WBC's 33K, HR 100-115, RR 30s during exertion / high teens at rest.  CTA revealed probable right lung abscess and empyema as described  above.  CVTS was consulted and they requested medical admission.  TRH did admit pt and requested PCCM consult.   SUBJECTIVE:  Pt awake, sitting in chair, states he is feeling much better and his only complaint is that he has not had a BM and is feelign his stomach is swollen. Incentive spirometry 500-750 currently. Cowley wrote  For 500 NS bolus last night for oligouria.  VITAL SIGNS: Temp:  [97.6 F (36.4 C)-98.5 F (36.9 C)] 98.2 F (36.8 C) (08/13 0347) Pulse Rate:  [92-123] 123 (08/13 0600) Resp:  [21-39] 29 (08/13 0600) BP: (86-151)/(60-77) 130/74 mmHg (08/13 0500) SpO2:  [93 %-100 %] 93 % (08/13 0600) Arterial Line BP: (75-145)/(48-71) 145/63 mmHg (08/13 0500) FiO2 (%):  [40 %] 40 % (08/12 0831) Weight:  [239 lb 3.2 oz (108.5 kg)] 239 lb 3.2 oz (108.5 kg) (08/13 0500)  PHYSICAL EXAMINATION: General: WDWN male, sitting in the chair in NAD Neuro: A&O X4 HEENT: [place] 4L. PERRL, sclerae anicteric. Cardiovascular: RRR, no M/R/G.  Lungs: Decreased bs rt. 3 cts rt without air leak   Abdomen: BS x 4, soft, NT/ND.  Musculoskeletal: No gross deformities, no edema.  Skin: Rt vats site dressing c d i.   Recent Labs Lab 09/07/13 0829 09/08/13 0400 09/09/13 0440  NA 139 141 138  K 3.5* 4.1 3.6*  CL 104 109 106  CO2 20 22 21   BUN 9 10 11   CREATININE 0.85 0.79 0.94  GLUCOSE 113* 139* 119*  Recent Labs Lab 09/07/13 0829 09/08/13 0400 09/09/13 0440  HGB 10.7* 8.1* 8.5*  HCT 30.9* 23.2* 25.0*  WBC 31.7* 22.5* 16.9*  PLT 420* 331 391   Chest 2 View  09/07/2013   CLINICAL DATA:  Preoperative evaluation. Draining abscess. Lung infection.  EXAM: CHEST  2 VIEW  COMPARISON:  Chest radiograph 09/06/2013; chest CT 09/06/2013.  FINDINGS: Stable enlarged cardiac and mediastinal contours with leftward mediastinal shift. Re- demonstrated air-fluid level within the right lower hemithorax compatible with probable large loculated right pleural effusion/empyema. Underlying right lung base  consolidative opacities. Small left pleural effusion.  IMPRESSION: Re- demonstrated large right pleural effusion with air-fluid level and underlying consolidative opacities. Opacities are concerning for infection. Large right pleural fluid collection may represent empyema. Gas may be secondary to infection or bronchopleural fistula.   Electronically Signed   By: Lovey Newcomer M.D.   On: 09/07/2013 09:00   Dg Chest Port 1 View  09/08/2013   CLINICAL DATA:  Video assisted thoracoscopy for drainage of empyema.  EXAM: PORTABLE CHEST - 1 VIEW  COMPARISON:  09/07/2013  FINDINGS: Endotracheal tube has its tip 3 cm above the carina. Nasogastric tube enters the abdomen. 3 chest tubes are in place on the right. There is no pneumothorax. Right internal jugular central line has its tip in the SVC above the right atrium. There is worsening volume loss in both lower lobes.  IMPRESSION: Three chest tubes in place on the right. No pneumothorax. Worsening volume loss in both lower lobes.   Electronically Signed   By: Nelson Chimes M.D.   On: 09/08/2013 08:14   Dg Chest Port 1 View  09/07/2013   CLINICAL DATA:  Endotracheal and chest tube placement. Video-assisted thoracoscopy with drainage of empyema and decortication, right chest. Postop day 0  EXAM: PORTABLE CHEST - 1 VIEW  COMPARISON:  09/07/2013  FINDINGS: Endotracheal tube tip: 2.7 cm above the carina. Right IJ line tip: SVC.  Multiple right-sided chest tubes are present. Small right basilar pneumothorax peripherally. There is fluid in the minor fissure. Reduced atelectasis at the right lung base, with some underlying interstitial and patchy airspace opacity in the right lung.  Suspected mild atelectasis in the left upper lobe. Mildly enlarged cardiopericardial silhouette. Significantly reduced right pleural effusion. Mildly blunted left lateral costophrenic angle.  IMPRESSION: 1. Interval right empyema drainage and decortication, with small right peripheral basilar  pneumothorax, significant decrease in right pleural effusion and right basilar atelectasis, and multiple chest tubes in place. There is some patchy interstitial and airspace opacity in the right lung. 2. Mild atelectasis in the left upper lobe. 3. Mildly enlarged cardiopericardial silhouette. 4. Tubes and lines appear satisfactorily positioned.   Electronically Signed   By: Sherryl Barters M.D.   On: 09/07/2013 14:13    ASSESSMENT / PLAN:  Right Empyema vs Effusion Probable Abscess Sepsis - due to above Acute hypoxic respiratory failure Tobacco use disorder Post Vats 8/11 with Rt CT x 3- Spirometry 500-750 currently Constipation  Edema Hypokalemia Nutrition Recs: - CTS would like for him to be transferred to 3300- doubtful being levophed need at night, titrate as able. - Supp K  - Diuresis - I/O +2838 - low dose lasix - Levophed being used at night up to 6 mcg - Pain control - Abx. Consider narrowing to target Gram positive Rods in pairs - DC antihypertensives - Vats per CVTS - Pain control with fentanyl- opioids cause hypotension and resulted in use of pressors - Clear liquid diet -  advance as tolerated to heart healthy diet - Ambulate   CC time 35 min.  Patient seen and examined, agree with above note.  I dictated the care and orders written for this patient under my direction.  Rush Farmer, M.D. [facility] Pulmonary/Critical Care Medicine. Pager: [phone]. After hours pager: [phone].

## 2013-09-10 ENCOUNTER — Inpatient Hospital Stay (HOSPITAL_COMMUNITY): Payer: Medicare Other

## 2013-09-10 LAB — TISSUE CULTURE
Culture: NO GROWTH
Culture: NO GROWTH
Gram Stain: NONE SEEN

## 2013-09-10 LAB — COMPREHENSIVE METABOLIC PANEL
ALT: 21 U/L (ref 0–53)
AST: 63 U/L — AB (ref 0–37)
Albumin: 2 g/dL — ABNORMAL LOW (ref 3.5–5.2)
Alkaline Phosphatase: 91 U/L (ref 39–117)
Anion gap: 11 (ref 5–15)
BUN: 11 mg/dL (ref 6–23)
CALCIUM: 8.3 mg/dL — AB (ref 8.4–10.5)
CHLORIDE: 106 meq/L (ref 96–112)
CO2: 24 mEq/L (ref 19–32)
Creatinine, Ser: 1.47 mg/dL — ABNORMAL HIGH (ref 0.50–1.35)
GFR calc Af Amer: 62 mL/min — ABNORMAL LOW (ref >90)
GFR, EST NON AFRICAN AMERICAN: 53 mL/min — AB (ref >90)
Glucose, Bld: 115 mg/dL — ABNORMAL HIGH (ref 70–99)
Potassium: 4.3 mEq/L (ref 3.7–5.3)
SODIUM: 141 meq/L (ref 137–147)
Total Bilirubin: 0.5 mg/dL (ref 0.3–1.2)
Total Protein: 6.8 g/dL (ref 6.0–8.3)

## 2013-09-10 LAB — PHOSPHORUS: PHOSPHORUS: 3.1 mg/dL (ref 2.3–4.6)

## 2013-09-10 LAB — CBC WITH DIFFERENTIAL/PLATELET
Basophils Absolute: 0 10*3/uL (ref 0.0–0.1)
Basophils Relative: 0 % (ref 0–1)
Eosinophils Absolute: 0.2 10*3/uL (ref 0.0–0.7)
Eosinophils Relative: 1 % (ref 0–5)
HCT: 26.7 % — ABNORMAL LOW (ref 39.0–52.0)
Hemoglobin: 9.1 g/dL — ABNORMAL LOW (ref 13.0–17.0)
LYMPHS PCT: 21 % (ref 12–46)
Lymphs Abs: 3.2 10*3/uL (ref 0.7–4.0)
MCH: 32.7 pg (ref 26.0–34.0)
MCHC: 34.1 g/dL (ref 30.0–36.0)
MCV: 96 fL (ref 78.0–100.0)
MONO ABS: 1.4 10*3/uL — AB (ref 0.1–1.0)
Monocytes Relative: 9 % (ref 3–12)
NEUTROS PCT: 69 % (ref 43–77)
Neutro Abs: 10.3 10*3/uL — ABNORMAL HIGH (ref 1.7–7.7)
PLATELETS: 461 10*3/uL — AB (ref 150–400)
RBC: 2.78 MIL/uL — ABNORMAL LOW (ref 4.22–5.81)
RDW: 13.9 % (ref 11.5–15.5)
WBC: 15.1 10*3/uL — AB (ref 4.0–10.5)

## 2013-09-10 LAB — BODY FLUID CULTURE: Gram Stain: NONE SEEN

## 2013-09-10 LAB — MAGNESIUM: Magnesium: 2 mg/dL (ref 1.5–2.5)

## 2013-09-10 MED ORDER — SENNA 8.6 MG PO TABS
1.0000 | ORAL_TABLET | Freq: Every day | ORAL | Status: DC | PRN
  Administered 2013-09-10: 8.6 mg via ORAL
  Filled 2013-09-10: qty 1

## 2013-09-10 MED ORDER — ALBUTEROL SULFATE (2.5 MG/3ML) 0.083% IN NEBU: 2.5000 mg | INHALATION_SOLUTION | Freq: Four times a day (QID) | RESPIRATORY_TRACT | Status: DC | PRN

## 2013-09-10 MED ORDER — IPRATROPIUM-ALBUTEROL 0.5-2.5 (3) MG/3ML IN SOLN
3.0000 mL | Freq: Three times a day (TID) | RESPIRATORY_TRACT | Status: DC
  Administered 2013-09-10 – 2013-09-11 (×2): 3 mL via RESPIRATORY_TRACT
  Filled 2013-09-10 (×2): qty 3

## 2013-09-10 MED ORDER — SENNOSIDES-DOCUSATE SODIUM 8.6-50 MG PO TABS
2.0000 | ORAL_TABLET | Freq: Every day | ORAL | Status: DC
  Administered 2013-09-10 – 2013-09-16 (×5): 2 via ORAL
  Filled 2013-09-10 (×8): qty 2

## 2013-09-10 NOTE — Progress Notes (Signed)
ANTIBIOTIC CONSULT NOTE - FOLLOW UP  Pharmacy Consult for Zosyn Indication: Empyema  No Known Allergies  Patient Measurements: Height: 6' (182.9 cm) Weight: 233 lb 7.5 oz (105.9 kg) IBW/kg (Calculated) : 77.6 Vital Signs: Temp: 98.1 F (36.7 C) (08/14 0818) Temp src: Oral (08/14 0818) BP: 141/83 mmHg (08/14 0800) Pulse Rate: 108 (08/14 0800) Intake/Output from previous day: 08/13 0701 - 08/14 0700 In: 4410 [P.O.:1060; I.V.:2100; IV Piggyback:1250] Out: 5330 [Urine:5230; Chest Tube:100] Intake/Output from this shift: Total I/O In: -  Out: 100 [Urine:100]  Labs:  Recent Labs  09/08/13 0400 09/09/13 0440 09/10/13 0545 09/10/13 0700  WBC 22.5* 16.9* 15.1*  --   HGB 8.1* 8.5* 9.1*  --   PLT 331 391 461*  --   CREATININE 0.79 0.94  --  1.47*   Estimated Creatinine Clearance: 73.9 ml/min (by C-G formula based on Cr of 1.47).  Recent Labs  09/08/13 1339  VANCOTROUGH 12.9    Assessment: 57 YOM with empyema s/p VATS and drainage on day #5 antibiotics. Afebrile. WBC trending down. Vancomycin was discontinued today per CCM. SCr up to 1.47 - monitoring trend.   CTX 8/10 >> 8/11 Azith 8/10 >> 8/11 Vanc 8/11 >> 8/14 (d/c'd by CCM on 8/14) 8/12 VT 12.9  Zosyn 8/11 >>  8/10 Bcx >> NGTD 8/11 Ucx >>NEG 8/11 Pleural fluid cx >> ngtd (GPR/GNR on stain)  Goal of Therapy:  Clinical resolution of infection  Plan:  Continue Zosyn 3.375g IV q8h- each dose over 4 hours.  Follow-up clinical status, renal function, and culture results.   Sloan Leiter, PharmD, BCPS Clinical Pharmacist [phone] 09/10/2013,10:33 AM

## 2013-09-10 NOTE — Progress Notes (Signed)
Name: Frank Solomon MRN: [id number] DOB: 1960-02-05    ADMISSION DATE:  09/06/2013 CONSULTATION DATE:  09/06/13  REFERRING MD :  [facility] PRIMARY SERVICE:  TRH  CHIEF COMPLAINT:  Empyema vs abscess  BRIEF PATIENT DESCRIPTION: 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  Has had mild cough, small amount of hemoptysis occasionally.  In ED, CTA chest demonstrated large right empyema vs effusion and 11x67mm RLL loculation, likely abscess.  Seen by CVTS who requested medical admission.  TRH requested PCCM consult.  SIGNIFICANT EVENTS:  8/10 - admit 8/11 rt vats 8/12 extubated  STUDIES: 8/10 CXR >>> b/l opacities, 3x4cm oval structure in RLL 8/10 Chest CTA >>> 11 x 61mm right lung base probable abscess with air fluid level, large right loculated pleural effusion vs empyema.  LINES / TUBES: PIV 8/11>> Aline 8/11 >>8/12 RIJ CVC 8/11>> Foley 8/11>>8/13  CULTURES: Blood 8/10 >>> NGTD Sputum 8/10 >>> Urine 8/10 >>>NG Tissue right pleural 8/11>> abundant Gram pos rods in pairs  ANTIBIOTICS: Vanc 8/10 >>> Zosyn 8/10 >>>  HISTORY OF PRESENT ILLNESS:  Frank Solomon is a [name] with PMH of CHF and remote TIA, who presented to [facility] ED 8/10 with SOB and right sided chest discomfort x 1 week, worse over past 2 - 3 days.  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  Since that time, he has had occasional chills and a cough that has mostly been non-productive; however, at times he has noticed a small amount of blood.  He denies any fevers/sweats, wheezing, N/V/D, abdominal pain, myalgias. No recent travel or exposure to known sick contacts. Does report smoking history. In ED, WBC's 33K, HR 100-115, RR 30s during exertion / high teens at rest.  CTA revealed probable right lung abscess and empyema as described  above.  CVTS was consulted and they requested medical admission.  TRH did admit pt and requested PCCM consult.   SUBJECTIVE:  Pt awake, sitting in chair, states he is feeling much better and his only complaint is that he has not had a BM and is feeling his stomach is swollen. Incentive spirometry 1000 currently. Fevers and chills last night with lack of sleep due to lasix.   VITAL SIGNS: Temp:  [97.7 F (36.5 C)-98.1 F (36.7 C)] 98.1 F (36.7 C) (08/14 0404) Pulse Rate:  [87-122] 109 (08/14 0700) Resp:  [16-35] 30 (08/14 0700) BP: (106-158)/(70-99) 129/84 mmHg (08/14 0700) SpO2:  [89 %-100 %] 96 % (08/14 0700) Arterial Line BP: (86)/(76) 86/76 mmHg (08/13 0900) Weight:  [233 lb 7.5 oz (105.9 kg)] 233 lb 7.5 oz (105.9 kg) (08/14 0630)  PHYSICAL EXAMINATION: General: WDWN male, sitting in the chair in NAD Neuro: A&O X4 HEENT: [place] 2L. PERRL, sclerae anicteric. Cardiovascular: RRR, no M/R/G.  Lungs: Decreased bs rt. 3 cts rt without air leak   Abdomen: BS hypoactive, soft, tenderness in RUQ Musculoskeletal: No gross deformities, +1 edema in LE Skin: Rt vats site dressing c d i.   Recent Labs Lab 09/07/13 0829 09/08/13 0400 09/09/13 0440  NA 139 141 138  K 3.5* 4.1 3.6*  CL 104 109 106  CO2 20 22 21   BUN 9 10 11   CREATININE 0.85 0.79 0.94  GLUCOSE 113* 139* 119*    Recent Labs Lab 09/08/13  0400 09/09/13 0440 09/10/13 0545  HGB 8.1* 8.5* 9.1*  HCT 23.2* 25.0* 26.7*  WBC 22.5* 16.9* 15.1*  PLT 331 391 461*   Dg Chest Port 1 View  09/09/2013   CLINICAL DATA:  Status post VATS  EXAM: PORTABLE CHEST - 1 VIEW  COMPARISON:  09/08/2013  FINDINGS: Cardiac shadow is stable in enlarged. A right jugular line and three right-sided thoracostomy catheters are again seen and stable. No pneumothorax is noted. Left lung remains clear. Right basilar atelectasis/effusion is again noted.  IMPRESSION: Stable changes on the right.  No new acute abnormality is noted.   Electronically Signed    By: Inez Catalina M.D.   On: 09/09/2013 08:07    ASSESSMENT / PLAN:  Right Empyema vs Effusion Probable Abscess Sepsis - due to above Acute hypoxic respiratory failure Tobacco use disorder Post Vats 8/11 with Rt CT x 3- Spirometry 500-750 currently Constipation  Edema Hypokalemia Nutrition Supplemental O2- 2L Recs: - Transfer to 3S today-off levophed - D/C Chest tube per CTS - Diuresis - I/O -1020 - Pain control - Abx. Consider narrowing to target Gram positive Rods in pairs - DC antihypertensives - Vats per CVTS - Pain control with fentanyl- opioids cause hypotension and resulted in use of pressors - Clear liquid diet - advance as tolerated to heart healthy diet - Ambulate with continued PT three times a week  Will transfer to SDU (3S) and back to [facility] service, PCCM will sign off, please call back if needed.  Patient seen and examined, agree with above note.  I dictated the care and orders written for this patient under my direction.  Rush Farmer, M.D. [facility] Pulmonary/Critical Care Medicine. Pager: [phone]. After hours pager: [phone].

## 2013-09-10 NOTE — Progress Notes (Signed)
3 Days Post-Op Procedure(s) (LRB): VIDEO ASSISTED THORACOSCOPY (VATS)/ DRAINAGE OF EMPYEMA/ DECORTICATION (Right) Subjective: C/o feeling cold Pain well controlled  Objective: Vital signs in last 24 hours: Temp:  [97.7 F (36.5 C)-98.1 F (36.7 C)] 98.1 F (36.7 C) (08/14 0818) Pulse Rate:  [87-122] 108 (08/14 0800) Cardiac Rhythm:  [-] Normal sinus rhythm;Sinus tachycardia (08/14 0400) Resp:  [18-35] 18 (08/14 0800) BP: (106-158)/(70-99) 141/83 mmHg (08/14 0800) SpO2:  [89 %-100 %] 100 % (08/14 0800) Arterial Line BP: (86)/(76) 86/76 mmHg (08/13 0900) Weight:  [233 lb 7.5 oz (105.9 kg)] 233 lb 7.5 oz (105.9 kg) (08/14 0630)  Hemodynamic parameters for last 24 hours: CVP:  [8 mmHg-13 mmHg] 13 mmHg  Intake/Output from previous day: 08/13 0701 - 08/14 0700 In: 4410 [P.O.:1060; I.V.:2100; IV Piggyback:1250] Out: 5330 [Urine:5230; Chest Tube:100] Intake/Output this shift: Total I/O In: -  Out: 100 [Urine:100]  General appearance: alert, cooperative and no distress Neurologic: intact Heart: regular rate and rhythm Lungs: diminished breath sounds bibasilar and R>L Abdomen: normal findings: soft, non-tender no air leak, serosanguinous drainage from tube  Lab Results:  Recent Labs  09/09/13 0440 09/10/13 0545  WBC 16.9* 15.1*  HGB 8.5* 9.1*  HCT 25.0* 26.7*  PLT 391 461*   BMET:  Recent Labs  09/09/13 0440 09/10/13 0700  NA 138 141  K 3.6* 4.3  CL 106 106  CO2 21 24  GLUCOSE 119* 115*  BUN 11 11  CREATININE 0.94 1.47*  CALCIUM 7.7* 8.3*    PT/INR:  Recent Labs  09/08/13 0400  LABPROT 15.5*  INR 1.23   ABG    Component Value Date/Time   PHART 7.421 09/08/2013 0502   HCO3 23.3 09/08/2013 0502   TCO2 24.4 09/08/2013 0502   ACIDBASEDEF 0.6 09/08/2013 0502   O2SAT 94.2 09/08/2013 0502   CBG (last 3)  No results found for this basename: GLUCAP,  in the last 72 hours  Assessment/Plan: S/P Procedure(s) (LRB): VIDEO ASSISTED THORACOSCOPY (VATS)/ DRAINAGE  OF EMPYEMA/ DECORTICATION (Right) - POD # 3 decortication for purulent organizing empyema  CXR read as increasing effusion- taking into account differences in technique I do not think there is a significant change. Drainage from tubes decreasing- will dc posterior CT today  ID - cultures still pending- this is likely a polymicrobial infection and had an anaerobic "smell" intraoperatively. Needs continued broad spectrum antibiotics. Has not grown staph aureus so will dc vanco  RENAL- creatinine up from 0.9 to 1.47 overnight- hold lisinopril, dc vanco as noted above. He has good UO.  SCD + enoxaparin for DVT prophylaxis  Ambulate   LOS: 4 days    HENDRICKSON,STEVEN C 09/10/2013

## 2013-09-10 NOTE — Progress Notes (Signed)
Physical Therapy Treatment Patient Details Name: Frank Solomon MRN: [id number] DOB: 1960/11/30 Today's Date: 2013-09-22    History of Present Illness Pt adm with rt empyema and underwent rt VATS and decortication on 09/07/13. PMH-CHF, TIA    PT Comments    Pt feeling dizzy and light headed so didn't amb as far today. Blood pressure checked and okay.  Follow Up Recommendations  No PT follow up     Equipment Recommendations  None recommended by PT    Recommendations for Other Services       Precautions / Restrictions Precautions Precaution Comments: lines/tubes    Mobility  Bed Mobility Overal bed mobility: Needs Assistance Bed Mobility: Sit to Supine       Sit to supine: Min assist   General bed mobility comments: Assist to bring legs up into bed.  Transfers Overall transfer level: Needs assistance Equipment used: Pushed w/c Transfers: Sit to/from Stand Sit to Stand: Min assist         General transfer comment: Assist to bring hips up and to control descent.  Ambulation/Gait Ambulation/Gait assistance: Min guard Ambulation Distance (Feet): 150 Feet Assistive device:  (Pushed w/c) Gait Pattern/deviations: Step-through pattern;Decreased step length - right;Decreased step length - left Gait velocity: slow Gait velocity interpretation: Below normal speed for age/gender General Gait Details: Pt reported feeling light-headed, dizzy prior to and during amb. Checked BP in sitting and standing and okay. Cued pt to keep looking up. No incr in dizziness, light headedness with amb.   Stairs            Wheelchair Mobility    Modified Rankin (Stroke Patients Only)       Balance Overall balance assessment: Needs assistance         Standing balance support: No upper extremity supported Standing balance-Leahy Scale: Fair                      Cognition Arousal/Alertness: Awake/alert Behavior During Therapy: WFL for tasks  assessed/performed Overall Cognitive Status: Within Functional Limits for tasks assessed                      Exercises      General Comments        Pertinent Vitals/Pain Pain Assessment: No/denies pain    Home Living                      Prior Function            PT Goals (current goals can now be found in the care plan section) Progress towards PT goals: Progressing toward goals    Frequency  Min 3X/week    PT Plan Current plan remains appropriate    Co-evaluation             End of Session   Activity Tolerance: Other (comment) (Pt dizzy and light headed.) Patient left: in bed;with call bell/phone within reach     Time: 1400-1416 PT Time Calculation (min): 16 min  Charges:  $Gait Training: 8-22 mins                    G Codes:      Frank Solomon 22-Sep-2013, 3:56 PM  [facility] PT [phone]

## 2013-09-11 ENCOUNTER — Inpatient Hospital Stay (HOSPITAL_COMMUNITY): Payer: Medicare Other

## 2013-09-11 DIAGNOSIS — I5042 Chronic combined systolic (congestive) and diastolic (congestive) heart failure: Secondary | ICD-10-CM

## 2013-09-11 DIAGNOSIS — E876 Hypokalemia: Secondary | ICD-10-CM

## 2013-09-11 DIAGNOSIS — I509 Heart failure, unspecified: Secondary | ICD-10-CM

## 2013-09-11 LAB — CBC WITH DIFFERENTIAL/PLATELET
Basophils Absolute: 0 10*3/uL (ref 0.0–0.1)
Basophils Relative: 0 % (ref 0–1)
EOS ABS: 0.1 10*3/uL (ref 0.0–0.7)
Eosinophils Relative: 1 % (ref 0–5)
HCT: 26.8 % — ABNORMAL LOW (ref 39.0–52.0)
Hemoglobin: 9 g/dL — ABNORMAL LOW (ref 13.0–17.0)
LYMPHS ABS: 3.6 10*3/uL (ref 0.7–4.0)
Lymphocytes Relative: 25 % (ref 12–46)
MCH: 33.6 pg (ref 26.0–34.0)
MCHC: 33.6 g/dL (ref 30.0–36.0)
MCV: 100 fL (ref 78.0–100.0)
MONOS PCT: 7 % (ref 3–12)
Monocytes Absolute: 1 10*3/uL (ref 0.1–1.0)
Neutro Abs: 9.6 10*3/uL — ABNORMAL HIGH (ref 1.7–7.7)
Neutrophils Relative %: 67 % (ref 43–77)
PLATELETS: 484 10*3/uL — AB (ref 150–400)
RBC: 2.68 MIL/uL — ABNORMAL LOW (ref 4.22–5.81)
RDW: 14.1 % (ref 11.5–15.5)
WBC: 14.3 10*3/uL — ABNORMAL HIGH (ref 4.0–10.5)

## 2013-09-11 LAB — COMPREHENSIVE METABOLIC PANEL
ALK PHOS: 103 U/L (ref 39–117)
ALT: 29 U/L (ref 0–53)
AST: 86 U/L — AB (ref 0–37)
Albumin: 1.9 g/dL — ABNORMAL LOW (ref 3.5–5.2)
Anion gap: 13 (ref 5–15)
BILIRUBIN TOTAL: 0.5 mg/dL (ref 0.3–1.2)
BUN: 12 mg/dL (ref 6–23)
CHLORIDE: 107 meq/L (ref 96–112)
CO2: 21 meq/L (ref 19–32)
CREATININE: 1.61 mg/dL — AB (ref 0.50–1.35)
Calcium: 8.7 mg/dL (ref 8.4–10.5)
GFR calc Af Amer: 55 mL/min — ABNORMAL LOW (ref >90)
GFR, EST NON AFRICAN AMERICAN: 48 mL/min — AB (ref >90)
Glucose, Bld: 114 mg/dL — ABNORMAL HIGH (ref 70–99)
Potassium: 4.5 mEq/L (ref 3.7–5.3)
Sodium: 141 mEq/L (ref 137–147)
Total Protein: 6.7 g/dL (ref 6.0–8.3)

## 2013-09-11 LAB — PHOSPHORUS: Phosphorus: 3.8 mg/dL (ref 2.3–4.6)

## 2013-09-11 LAB — MAGNESIUM: Magnesium: 2.2 mg/dL (ref 1.5–2.5)

## 2013-09-11 NOTE — Progress Notes (Signed)
4 Days Post-Op Procedure(s) (LRB): VIDEO ASSISTED THORACOSCOPY (VATS)/ DRAINAGE OF EMPYEMA/ DECORTICATION (Right) Subjective: Some incisional pain +BM  Objective: Vital signs in last 24 hours: Temp:  [98.3 F (36.8 C)-98.6 F (37 C)] 98.3 F (36.8 C) (08/15 0736) Pulse Rate:  [84-117] 87 (08/15 0800) Cardiac Rhythm:  [-] Normal sinus rhythm (08/15 0400) Resp:  [17-37] 21 (08/15 0800) BP: (113-142)/(71-89) 135/75 mmHg (08/15 0800) SpO2:  [92 %-100 %] 100 % (08/15 0800) Weight:  [232 lb 12.9 oz (105.6 kg)] 232 lb 12.9 oz (105.6 kg) (08/15 0400)  Hemodynamic parameters for last 24 hours:    Intake/Output from previous day: 08/14 0701 - 08/15 0700 In: 1002.5 [P.O.:480; I.V.:372.5; IV Piggyback:150] Out: 3016 [Urine:1400; Chest Tube:45] Intake/Output this shift: Total I/O In: -  Out: 175 [Urine:175]  General appearance: alert and no distress Neurologic: intact Heart: regular rate and rhythm Lungs: diminished breath sounds right base Abdomen: normal findings: soft, non-tender minimal serosanguinous drainage from CT  Lab Results:  Recent Labs  09/10/13 0545 09/11/13 0430  WBC 15.1* 14.3*  HGB 9.1* 9.0*  HCT 26.7* 26.8*  PLT 461* 484*   BMET:  Recent Labs  09/10/13 0700 09/11/13 0430  NA 141 141  K 4.3 4.5  CL 106 107  CO2 24 21  GLUCOSE 115* 114*  BUN 11 12  CREATININE 1.47* 1.61*  CALCIUM 8.3* 8.7    PT/INR: No results found for this basename: LABPROT, INR,  in the last 72 hours ABG    Component Value Date/Time   PHART 7.421 09/08/2013 0502   HCO3 23.3 09/08/2013 0502   TCO2 24.4 09/08/2013 0502   ACIDBASEDEF 0.6 09/08/2013 0502   O2SAT 94.2 09/08/2013 0502   CBG (last 3)  No results found for this basename: GLUCAP,  in the last 72 hours  Assessment/Plan: S/P Procedure(s) (LRB): VIDEO ASSISTED THORACOSCOPY (VATS)/ DRAINAGE OF EMPYEMA/ DECORTICATION (Right) Plan for transfer to step-down: see transfer orders  POD # 4 drainage of empyema and  decortication  Minimal output from CT, CXR shows improved aeration right base DC CT Continue OOB, ambulation Acute renal failure with creatinine going from 0.9 to 1.47 yesterday, slightly up today at 1.61  Good UO yesterday- follow Cultures growing rare group F strep, fluid was frank pus with anaerobic smell On Zosyn  Transfer to 3S   LOS: 5 days    HENDRICKSON,STEVEN C 09/11/2013

## 2013-09-11 NOTE — Op Note (Signed)
NAMENATHANEL, TALLMAN NO.:  [id number]  MEDICAL RECORD NO.:  [id number]  LOCATION:  2S12C                        FACILITY:  [facility]  PHYSICIAN:  Revonda Standard. Roxan Hockey, M.D.DATE OF BIRTH:  07/15/1960  DATE OF PROCEDURE:  09/07/2013 DATE OF DISCHARGE:                              OPERATIVE REPORT   PREOPERATIVE DIAGNOSIS:  Right empyema.  POSTOPERATIVE DIAGNOSES:  Right empyema with probable ruptured right lower lobe abscess.  PROCEDURE:  Right video-assisted thoracoscopy, drainage of empyema, visceral and parietal pleural decortication.  SURGEON:  Revonda Standard. Roxan Hockey, M.D.  ASSISTANT:  Horton Chin, RNFA  ANESTHESIA:  General.  FINDINGS:  Large right empyema, grossly purulent fluid with foul "anaerobic" odor, extensive pleural debris and exudate, thick parietal and visceral pleural peels, likely right lower lobe abscess.  CLINICAL NOTE:  Mr. Frank Solomon is a 53 year old gentleman who presented late in the evening of September 06, 2013 with chest pain and shortness of breath over a 3-day period.  He was found to be septic with an elevated white count.  A CT of the chest showed a loculated right empyema.  The patient was advised to undergo thoracoscopic drainage and decortication. The indications, risks, benefits, and alternatives were discussed in detail with the patient.  He understood and accepted the risks and agreed to proceed.  OPERATIVE NOTE:  Mr. Frank Solomon was brought to the operating room on September 07, 2013. There he had induction of general anesthesia and was intubated with a double-lumen endotracheal tube.  A Foley catheter was placed. Sequential compressive devices were placed on the calves for DVT prophylaxis.  He was placed in the left lateral decubitus position and the right chest was prepped and draped in usual sterile fashion.  Single lung ventilation of the left lung was initiated and was tolerated well throughout the procedure.  An incision  was made in the midaxillary line in approximately the seventh intercostal space.  The chest was entered bluntly using a 5-mm port. There was a foul odor.  Suction applied to the side port on the port revealed grossly purulent fluid.  This was sent for aerobic and anaerobic cultures.  A small working incision was made in the fifth intercostal space anterolaterally.  The serratus muscles were separated. No rib spreading was performed during the procedure.  The remainder of the fluid was evacuated.  There was approximately a liter of fluid in all.  There was extensive debris throughout the pleural space.  This was Gelatinous. A portion of this was also sent for cultures as well as pathology.  After the major pocket of fluid and debris had been evacuated, it was clear that there were multiple other loculations.  The lung was gently taken down off the chest wall in areas where it was adherent to allow evacuation of all the loculated fluid collections.  The lower and middle lobes were encased in a dense pleural peel.  This was decorticated as much as possible.  The peel was particularly thick along the diaphragmatic surface.  This was a slow and tedious process.  The lung was periodically reinflated to assess the efficacy of the decortication, ultimately there was good re-expansion of both the lower and middle  lobes.  There was also loculated fluid in the fissures and the fissures were opened to evacuate these areas as well.  The parietal pleura on the diaphragm and chest wall then was decorticated as well. This was very friable and bled easily.  The chest was copiously irrigated with several liters of warm saline.  Two 32-French Blake drains were placed.  One was placed along the diaphragm and one was placed posteriorly. A 28-French chest tube was placed anteriorly.  These were all secured with #1 silk sutures.  The right lung was reinflated.  There was good aeration of all 3 lobes.  The  working incision was closed with a #1 Vicryl fascial suture. The subcutaneous tissue and skin were closed in standard fashion.  All sponge, needle, and instrument counts were correct at the end of the procedure.  The patient was placed back in supine position.  He was reintubated with a single-lumen endotracheal tube, with plans to keep the patient intubated overnight.  The patient then was transported to the postanesthetic care unit in good condition.     Revonda Standard Roxan Hockey, M.D.     SCH/MEDQ  D:  09/11/2013  T:  09/11/2013  Job:  [id number]

## 2013-09-11 NOTE — Progress Notes (Signed)
[facility] TEAM 1 - Stepdown/ICU TEAM Progress Note  Brysin Towery EPP:[id number] DOB: 1960/04/30 DOA: 09/06/2013 PCP: No primary provider on file.  Admit HPI / Brief Narrative: 53 y.o. Yo BM PMHx Tobacco abuse, CHF, hx/o TIA presenting with acute resp failure. Pt reports sudden onset of CP and SOB over past 2-3 days. Denies any recent trauma. Has had some mild chills at home. Active smoker, though he reports that he has not smoked since onset of illness. No known sick contacts. Has had some extertional dyspnea and orthopnea. No PND. No LE swelling.  On presentation to the ER. tmax 99.9, HR 100s-120s, RR- 20s-40, SBP 100s-130s. Satting > 95% on 3L. WBC 33.2, hgb 11, K 3.3, Cr 0.92. Trop and lactate WNL. EKG pending. CT angio of the chest shows at least 11x12 cm RLL air fluid level of presumed necrotic tissue in addition to R loculated empyema with infectious ground glass opacities and reactive mediastinal LAD. + mild mediastinal L shift. ED PA discussed case with CT surgery. Recs were for medical admission and consult. Initially started on CAP coverage by EDPA.    HPI/Subjective: 8/15 sitting comfortably on the edge of his bed. States had just completed ambulating around the ward without CT/SOB/DOE,    Assessment/Plan:  Resp failure/Sepsis  -S/P right VATS -continue vanc and zosyn  -Pleural fluid pleural fluid positive abundant Gram pos in pairs and abundant gram-negative rods; speciation and susceptibilities pending   Right Empyema -See respiratory failure/sepsis  Acute hypoxic respiratory failure  -Resolved  Combined systolic and diastolic CHF -See echocardiogram below  HTN -Within AHA guidelines     Hypokalemia  -Resolved number continue to monitor -Potassium goal>4 -Check magnesium    Code Status: FULL Family Communication: no family present at time of exam Disposition Plan:Per surgery    Consultants: Dr. Modesto Charon (cardiothoracic surgery) Dr.Vishal Mungal  (PCCM.)    Procedure/Significant Events: 8/10 CXR >>> b/l opacities, 3x4cm oval structure in RLL 8/10 Chest CTA >>> 11 x 74mm right lung base probable abscess with air fluid level, large right loculated pleural effusion vs empyema.  8/11VIDEO ASSISTED THORACOSCOPY (VATS)/ DRAINAGE OF EMPYEMA/ DECORTICATION (Right) 8/11 echocardiogram;- Left ventricle: mild LVH. LVEF= 35% to 40%. Diffuse hypokinesis.  - (grade 1 diastolic dysfunction). 8/12 extubated     Culture 8/10 Blood  >>> NGTD  8/10 Sputum >>>  8/10 Urine >>>NG 8/11 pleural fluid positive abundant Gram pos in pairs and abundant gram-negative rods     Antibiotics: Vancomycin 8/11>> Zosyn 8/11>>    DVT prophylaxis: Lovenox   Devices    LINES / TUBES:  8/10 20ga right forearm  8/11 arterial line 8/11 right IJ double-lumen 8/11 Y chest tube 1/ 36 fr; CT #2/32 fr; CT #3/71fr>> discontinued by PCCM. Foley 8/11>>8/13    Continuous Infusions: . dexmedetomidine Stopped (09/08/13 1000)  . dextrose 5 % and 0.9 % NaCl with KCl 20 mEq/L 10 mL/hr at 09/10/13 0835  . norepinephrine (LEVOPHED) Adult infusion Stopped (09/08/13 2200)    Objective: VITAL SIGNS: Temp: 98.1 F (36.7 C) (08/15 1213) Temp src: Oral (08/15 1213) BP: 135/75 mmHg (08/15 0800) Pulse Rate: 87 (08/15 0800) SPO2;97% on room air FIO2:   Intake/Output Summary (Last 24 hours) at 09/11/13 1410 Last data filed at 09/11/13 0736  Gross per 24 hour  Intake    550 ml  Output   1320 ml  Net   -770 ml     Exam: General: A./O. x4, NAD, negative acute respiratory distress.  Lungs: Clear to  auscultation left lung field; clear to auscultation RUL, decreased breath sounds RML/RLL, with mild right basilar crackles. Chest tube site on right lateral thoracic wall covered and clean negative sign of infection all chest tubes discontinued.   Cardiovascular: Tachycardic, Regular rhythm without murmur gallop or rub normal S1 and S2 Abdomen: Nontender,  nondistended, soft, bowel sounds positive, no rebound, no ascites, no appreciable mass Extremities: No significant cyanosis, clubbing, or edema bilateral lower extremities  Data Reviewed: Basic Metabolic Panel:  Recent Labs Lab 09/07/13 0216 09/07/13 0829 09/08/13 0400 09/09/13 0440 09/10/13 0700 09/11/13 0430  NA 136* 139 141 138 141 141  K 3.3* 3.5* 4.1 3.6* 4.3 4.5  CL 102 104 109 106 106 107  CO2 20 20 22 21 24 21   GLUCOSE 155* 113* 139* 119* 115* 114*  BUN 10 9 10 11 11 12   CREATININE 0.90 0.85 0.79 0.94 1.47* 1.61*  CALCIUM 7.8* 8.2* 7.7* 7.7* 8.3* 8.7  MG 2.0  --  2.2 2.2 2.0 2.2  PHOS  --   --  3.1 2.6 3.1 3.8   Liver Function Tests:  Recent Labs Lab 09/07/13 0829 09/08/13 0400 09/09/13 0440 09/10/13 0700 09/11/13 0430  AST 55* 45* 48* 63* 86*  ALT 26 19 19 21 29   ALKPHOS 118* 111 82 91 103  BILITOT 0.8 0.6 0.5 0.5 0.5  PROT 6.8 5.5* 5.8* 6.8 6.7  ALBUMIN 1.9* 1.5* 1.6* 2.0* 1.9*   No results found for this basename: LIPASE, AMYLASE,  in the last 168 hours No results found for this basename: AMMONIA,  in the last 168 hours CBC:  Recent Labs Lab 09/07/13 0216 09/07/13 0829 09/08/13 0400 09/09/13 0440 09/10/13 0545 09/11/13 0430  WBC 32.7* 31.7* 22.5* 16.9* 15.1* 14.3*  NEUTROABS 27.8*  --  18.4* 11.9* 10.3* 9.6*  HGB 9.7* 10.7* 8.1* 8.5* 9.1* 9.0*  HCT 28.3* 30.9* 23.2* 25.0* 26.7* 26.8*  MCV 95.6 97.8 95.9 98.4 96.0 100.0  PLT 416* 420* 331 391 461* 484*   Cardiac Enzymes:  Recent Labs Lab 09/07/13 0216 09/07/13 0829 09/07/13 1800  TROPONINI <0.30 <0.30 <0.30   BNP (last 3 results)  Recent Labs  09/06/13 2109  PROBNP 982.1*   CBG: No results found for this basename: GLUCAP,  in the last 168 hours  Recent Results (from the past 240 hour(s))  CULTURE, BLOOD (ROUTINE X 2)     Status: None   Collection Time    09/06/13  8:59 PM      Result Value Ref Range Status   Specimen Description BLOOD HAND RIGHT   Final   Special Requests  BOTTLES DRAWN AEROBIC AND ANAEROBIC 5CC   Final   Culture  Setup Time     Final   Value: 09/07/2013 03:14     Performed at [facility]   Culture     Final   Value:        BLOOD CULTURE RECEIVED NO GROWTH TO DATE CULTURE WILL BE HELD FOR 5 DAYS BEFORE ISSUING A FINAL NEGATIVE REPORT     Performed at [facility]   Report Status PENDING   Incomplete  CULTURE, BLOOD (ROUTINE X 2)     Status: None   Collection Time    09/06/13  9:23 PM      Result Value Ref Range Status   Specimen Description BLOOD FOREARM RIGHT   Final   Special Requests BOTTLES DRAWN AEROBIC ONLY [place]   Final   Culture  Setup Time  Final   Value: 09/07/2013 03:15     Performed at [facility]   Culture     Final   Value:        BLOOD CULTURE RECEIVED NO GROWTH TO DATE CULTURE WILL BE HELD FOR 5 DAYS BEFORE ISSUING A FINAL NEGATIVE REPORT     Performed at [facility]   Report Status PENDING   Incomplete  URINE CULTURE     Status: None   Collection Time    09/07/13  1:21 AM      Result Value Ref Range Status   Specimen Description URINE, CLEAN CATCH   Final   Special Requests NONE   Final   Culture  Setup Time     Final   Value: 09/07/2013 04:28     Performed at [place]     Final   Value: NO GROWTH     Performed at [facility]   Culture     Final   Value: NO GROWTH     Performed at [facility]   Report Status 09/08/2013 FINAL   Final  MRSA PCR SCREENING     Status: None   Collection Time    09/07/13  2:00 AM      Result Value Ref Range Status   MRSA by PCR NEGATIVE  NEGATIVE Final   Comment:            The GeneXpert MRSA Assay (FDA     approved for NASAL specimens     only), is one component of a     comprehensive MRSA colonization     surveillance program. It is not     intended to diagnose MRSA     infection nor to guide or     monitor treatment for     MRSA infections.  BODY FLUID CULTURE     Status: None   Collection  Time    09/07/13 10:29 AM      Result Value Ref Range Status   Specimen Description FLUID RIGHT PLEURAL   Final   Special Requests POF VANCOMYCIN   Final   Gram Stain     Final   Value: NO WBC SEEN     ABUNDANT GRAM VARIABLE ROD     RARE GRAM POSITIVE COCCI     IN CHAINS Gram Stain Report Called to,Read Back By and Verified With: Gram Stain Report Called to,Read Back By and Verified With: CHRISTINE @ 0240 ON 09/10/2013 BY WEBSP     Performed at [facility]   Culture     Final   Value: RARE STREPTOCOCCUS GROUP F     Note: CRITICAL RESULT CALLED TO, READ BACK BY AND VERIFIED WITH: CHRISTINE     Performed at [facility]   Report Status 09/10/2013 FINAL   Final  ANAEROBIC CULTURE     Status: None   Collection Time    09/07/13 10:29 AM      Result Value Ref Range Status   Specimen Description FLUID RIGHT PLEURAL   Final   Special Requests POF VANCOMYCIN   Final   Gram Stain PENDING   Incomplete   Culture     Final   Value: NO ANAEROBES ISOLATED; CULTURE IN PROGRESS FOR 5 DAYS     Performed at [facility]   Report Status PENDING   Incomplete  TISSUE CULTURE     Status: None   Collection Time  09/07/13 10:30 AM      Result Value Ref Range Status   Specimen Description TISSUE RIGHT PLEURAL   Final   Special Requests POF ON VANCOMYCIN RIGHT PLEURAL PEEL   Final   Gram Stain     Final   Value: RARE WBC PRESENT, PREDOMINANTLY MONONUCLEAR     NO SQUAMOUS EPITHELIAL CELLS SEEN     ABUNDANT GRAM POSITIVE RODS     IN PAIRS ABUNDANT GRAM NEGATIVE RODS     FEW GRAM POSITIVE RODS   Culture     Final   Value: NO GROWTH 3 DAYS     Performed at [facility]   Report Status 09/10/2013 FINAL   Final  ANAEROBIC CULTURE     Status: None   Collection Time    09/07/13 10:30 AM      Result Value Ref Range Status   Specimen Description TISSUE RIGHT PLEURAL   Final   Special Requests POF ON VANCOMYCIN RIGHT PLEURAL PEEL   Final   Gram Stain     Final   Value:  RARE WBC PRESENT, PREDOMINANTLY MONONUCLEAR     NO SQUAMOUS EPITHELIAL CELLS SEEN     ABUNDANT GRAM POSITIVE RODS     IN PAIRS ABUNDANT GRAM NEGATIVE RODS     FEW GRAM POSITIVE RODS   Culture     Final   Value: NO ANAEROBES ISOLATED; CULTURE IN PROGRESS FOR 5 DAYS     Performed at [facility]   Report Status PENDING   Incomplete  TISSUE CULTURE     Status: None   Collection Time    09/07/13 11:10 AM      Result Value Ref Range Status   Specimen Description TISSUE RIGHT PLEURAL   Final   Special Requests POF VANCOMYCIN RIGHT VISCERAL PLEURAL PEEL   Final   Gram Stain     Final   Value: NO WBC SEEN     NO SQUAMOUS EPITHELIAL CELLS SEEN     NO ORGANISMS SEEN     Performed at [facility]   Culture     Final   Value: NO GROWTH 3 DAYS     Performed at [facility]   Report Status 09/10/2013 FINAL   Final  ANAEROBIC CULTURE     Status: None   Collection Time    09/07/13 11:10 AM      Result Value Ref Range Status   Specimen Description TISSUE RIGHT PLEURAL   Final   Special Requests POF VANCOMYCIN RIGHT VISERSL PLEURAL PEEL   Final   Gram Stain     Final   Value: NO WBC SEEN     NO SQUAMOUS EPITHELIAL CELLS SEEN     NO ORGANISMS SEEN     Performed at [facility]   Culture     Final   Value: NO ANAEROBES ISOLATED; CULTURE IN PROGRESS FOR 5 DAYS     Performed at [facility]   Report Status PENDING   Incomplete     Studies:  Recent x-ray studies have been reviewed in detail by the Attending Physician  Scheduled Meds:  Scheduled Meds: . acetaminophen  1,000 mg Oral 4 times per day   Or  . acetaminophen (TYLENOL) oral liquid 160 mg/5 mL  1,000 mg Oral 4 times per day  . enoxaparin (LOVENOX) injection  40 mg Subcutaneous Daily  . feeding supplement (RESOURCE BREEZE)  1 Container Oral TID BM  . pantoprazole (PROTONIX) IV  40  mg Intravenous Q24H  . piperacillin-tazobactam (ZOSYN)  IV  3.375 g Intravenous 3 times per day  .  senna-docusate  2 tablet Oral QHS  . sodium chloride  3 mL Intravenous Q12H    Time spent on care of this patient: 40 mins   Allie Bossier , MD   [facility] Office  [phone] Pager [phone]  On-Call/Text Page:      [email or web address]      password [email or web address]  If 7PM-7AM, please contact night-coverage [email or web address] Password [email or web address] 09/11/2013, 2:10 PM   LOS: 5 days

## 2013-09-11 NOTE — Progress Notes (Signed)
Pt ambulated in the hallway 200 ft last night and 200 ft this morning. Tolerated well. Pt sat in the chair for about two hours last night, and he plans to sit in the chair for breakfast.  Vella Raring, RN

## 2013-09-12 ENCOUNTER — Inpatient Hospital Stay (HOSPITAL_COMMUNITY): Payer: Medicare Other

## 2013-09-12 DIAGNOSIS — I10 Essential (primary) hypertension: Secondary | ICD-10-CM

## 2013-09-12 DIAGNOSIS — K922 Gastrointestinal hemorrhage, unspecified: Secondary | ICD-10-CM

## 2013-09-12 LAB — CBC
HCT: 25.7 % — ABNORMAL LOW (ref 39.0–52.0)
HEMOGLOBIN: 8.6 g/dL — AB (ref 13.0–17.0)
MCH: 33.5 pg (ref 26.0–34.0)
MCHC: 33.5 g/dL (ref 30.0–36.0)
MCV: 100 fL (ref 78.0–100.0)
PLATELETS: 513 10*3/uL — AB (ref 150–400)
RBC: 2.57 MIL/uL — AB (ref 4.22–5.81)
RDW: 14.3 % (ref 11.5–15.5)
WBC: 13.4 10*3/uL — AB (ref 4.0–10.5)

## 2013-09-12 LAB — CBC WITH DIFFERENTIAL/PLATELET
BASOS ABS: 0 10*3/uL (ref 0.0–0.1)
Basophils Relative: 0 % (ref 0–1)
EOS PCT: 1 % (ref 0–5)
Eosinophils Absolute: 0.1 10*3/uL (ref 0.0–0.7)
HCT: 22.6 % — ABNORMAL LOW (ref 39.0–52.0)
Hemoglobin: 7.7 g/dL — ABNORMAL LOW (ref 13.0–17.0)
LYMPHS ABS: 3.4 10*3/uL (ref 0.7–4.0)
Lymphocytes Relative: 25 % (ref 12–46)
MCH: 33.8 pg (ref 26.0–34.0)
MCHC: 34.1 g/dL (ref 30.0–36.0)
MCV: 99.1 fL (ref 78.0–100.0)
MONO ABS: 0.8 10*3/uL (ref 0.1–1.0)
MONOS PCT: 6 % (ref 3–12)
NEUTROS PCT: 68 % (ref 43–77)
Neutro Abs: 9.3 10*3/uL — ABNORMAL HIGH (ref 1.7–7.7)
PLATELETS: 427 10*3/uL — AB (ref 150–400)
RBC: 2.28 MIL/uL — AB (ref 4.22–5.81)
RDW: 14.2 % (ref 11.5–15.5)
WBC: 13.6 10*3/uL — AB (ref 4.0–10.5)

## 2013-09-12 LAB — ANAEROBIC CULTURE: Gram Stain: NONE SEEN

## 2013-09-12 LAB — COMPREHENSIVE METABOLIC PANEL
ALT: 28 U/L (ref 0–53)
AST: 74 U/L — ABNORMAL HIGH (ref 0–37)
Albumin: 1.7 g/dL — ABNORMAL LOW (ref 3.5–5.2)
Alkaline Phosphatase: 87 U/L (ref 39–117)
Anion gap: 8 (ref 5–15)
BILIRUBIN TOTAL: 0.3 mg/dL (ref 0.3–1.2)
BUN: 10 mg/dL (ref 6–23)
CALCIUM: 7.8 mg/dL — AB (ref 8.4–10.5)
CHLORIDE: 111 meq/L (ref 96–112)
CO2: 23 meq/L (ref 19–32)
CREATININE: 1.67 mg/dL — AB (ref 0.50–1.35)
GFR, EST AFRICAN AMERICAN: 53 mL/min — AB (ref >90)
GFR, EST NON AFRICAN AMERICAN: 46 mL/min — AB (ref >90)
GLUCOSE: 312 mg/dL — AB (ref 70–99)
Potassium: 5 mEq/L (ref 3.7–5.3)
Sodium: 142 mEq/L (ref 137–147)
Total Protein: 5.6 g/dL — ABNORMAL LOW (ref 6.0–8.3)

## 2013-09-12 LAB — PREPARE RBC (CROSSMATCH)

## 2013-09-12 MED ORDER — PANTOPRAZOLE SODIUM 40 MG PO TBEC: 40.0000 mg | DELAYED_RELEASE_TABLET | Freq: Two times a day (BID) | ORAL | Status: DC

## 2013-09-12 MED ORDER — SODIUM CHLORIDE 0.9 % IV SOLN
Freq: Once | INTRAVENOUS | Status: AC
  Administered 2013-09-12: 10 mL/h via INTRAVENOUS

## 2013-09-12 MED ORDER — PANTOPRAZOLE SODIUM 40 MG IV SOLR
40.0000 mg | Freq: Two times a day (BID) | INTRAVENOUS | Status: DC
  Administered 2013-09-12 – 2013-09-13 (×2): 40 mg via INTRAVENOUS
  Filled 2013-09-12 (×2): qty 40

## 2013-09-12 MED ORDER — PANTOPRAZOLE SODIUM 40 MG IV SOLR
40.0000 mg | Freq: Two times a day (BID) | INTRAVENOUS | Status: DC
  Administered 2013-09-12: 40 mg via INTRAVENOUS
  Filled 2013-09-12 (×2): qty 40

## 2013-09-12 NOTE — Progress Notes (Signed)
Pt had 18 beat run of vtach pt sitting in chair denies any distress, now SR will pass on to day shift strip saved

## 2013-09-12 NOTE — Progress Notes (Signed)
5 Days Post-Op Procedure(s) (LRB): VIDEO ASSISTED THORACOSCOPY (VATS)/ DRAINAGE OF EMPYEMA/ DECORTICATION (Right) Subjective: C/o black stools Some incisional pain  Objective: Vital signs in last 24 hours: Temp:  [98 F (36.7 C)-98.4 F (36.9 C)] 98.1 F (36.7 C) (08/16 0753) Pulse Rate:  [94-112] 112 (08/16 0320) Cardiac Rhythm:  [-] Sinus tachycardia (08/16 0800) Resp:  [19-30] 21 (08/16 0320) BP: (126-136)/(75-88) 136/84 mmHg (08/16 0320) SpO2:  [92 %-98 %] 92 % (08/16 0320)  Hemodynamic parameters for last 24 hours:    Intake/Output from previous day: 08/15 0701 - 08/16 0700 In: 410 [P.O.:360; IV Piggyback:50] Out: 1870 [Urine:1850; Chest Tube:20] Intake/Output this shift:    General appearance: alert and no distress Neurologic: intact Heart: regular rate and rhythm Lungs: diminished breath sounds right base Abdomen: normal findings: soft, non-tender Wound: clean   Lab Results:  Recent Labs  09/11/13 0430 09/12/13 0300  WBC 14.3* 13.6*  HGB 9.0* 7.7*  HCT 26.8* 22.6*  PLT 484* 427*   BMET:  Recent Labs  09/11/13 0430 09/12/13 0300  NA 141 142  K 4.5 5.0  CL 107 111  CO2 21 23  GLUCOSE 114* 312*  BUN 12 10  CREATININE 1.61* 1.67*  CALCIUM 8.7 7.8*    PT/INR: No results found for this basename: LABPROT, INR,  in the last 72 hours ABG    Component Value Date/Time   PHART 7.421 09/08/2013 0502   HCO3 23.3 09/08/2013 0502   TCO2 24.4 09/08/2013 0502   ACIDBASEDEF 0.6 09/08/2013 0502   O2SAT 94.2 09/08/2013 0502   CBG (last 3)  No results found for this basename: GLUCAP,  in the last 72 hours  Assessment/Plan: S/P Procedure(s) (LRB): VIDEO ASSISTED THORACOSCOPY (VATS)/ DRAINAGE OF EMPYEMA/ DECORTICATION (Right) - POD # 5 post decortication  Continues to improve from a respiratory standpoint. CXR looks better this AM- will take several weeks to completely resolve. WBC trending down. I think he needs to complete 7 days of IV antibiotics then could  probably be changed to PO antibiotics for another 2 weeks  He is having dark stools and his Hgb has dropped from 27 to 23. Concerned this may be a GI bleed. Will type and screen, dc enoxaparin and change protonix to BID. Recheck CBC this afternoon  Renal- creatinine appears to be leveling off. Continue to follow   LOS: 6 days    [place] Jenifer C 09/12/2013

## 2013-09-12 NOTE — Progress Notes (Signed)
[place] TEAM 1 - Stepdown/ICU TEAM Progress Note  Artice Bergerson GBT:[id number] DOB: Jan 01, 1961 DOA: 09/06/2013 PCP: No primary provider on file.  Admit HPI / Brief Narrative: 53 y.o. Yo BM PMHx Tobacco abuse, CHF, hx/o TIA presenting with acute resp failure. Pt reports sudden onset of CP and SOB over past 2-3 days. Denies any recent trauma. Has had some mild chills at home. Active smoker, though he reports that he has not smoked since onset of illness. No known sick contacts. Has had some extertional dyspnea and orthopnea. No PND. No LE swelling.  On presentation to the ER. tmax 99.9, HR 100s-120s, RR- 20s-40, SBP 100s-130s. Satting > 95% on 3L. WBC 33.2, hgb 11, K 3.3, Cr 0.92. Trop and lactate WNL. EKG pending. CT angio of the chest shows at least 11x12 cm RLL air fluid level of presumed necrotic tissue in addition to R loculated empyema with infectious ground glass opacities and reactive mediastinal LAD. + mild mediastinal L shift. ED PA discussed case with CT surgery. Recs were for medical admission and consult. Initially started on CAP coverage by EDPA.    HPI/Subjective: 8/16 sitting comfortably in a chair talking with his brother. Negative SOB/DOE. States had been up ambulating today.    Assessment/Plan:  Resp failure/Sepsis  -S/P right VATS -continue vanc and zosyn  -Pleural fluid pleural fluid positive abundant Gram pos rods in pairs and abundant gram-negative rods; speciation and susceptibilities pending   Right Empyema -See respiratory failure/sepsis  Acute hypoxic respiratory failure  -Resolved  Combined systolic and diastolic CHF -See echocardiogram below -Maintain hemoglobin> 8 -8/16 Patient's current hemoglobin 7.7; repeat hemoglobin 8.6 so blood transfusion was canceled -Titrate O2 to maintain SpO2> 93%  HTN -Within AHA guidelines    GI bleed? -Lovenox stopped by surgery -If in the a.m. patient's hemoglobin continues to drop post Lovenox consult GI -Occult  blood card daily  Hypokalemia  -Patient now becoming hyperkalemic will DC daily potassium, continue to monitor; replace as indicated -Potassium goal>4 -Check magnesium; magnesium goal>2    Code Status: FULL Family Communication: Brother present at time of exam Disposition Plan:Per surgery    Consultants: Dr. Modesto Charon (cardiothoracic surgery) Dr.Vishal Mungal (PCCM.)    Procedure/Significant Events: 8/10 CXR >>> b/l opacities, 3x4cm oval structure in RLL 8/10 Chest CTA >>> 11 x 23mm right lung base probable abscess with air fluid level, large right loculated pleural effusion vs empyema.  8/11VIDEO ASSISTED THORACOSCOPY (VATS)/ DRAINAGE OF EMPYEMA/ DECORTICATION (Right) 8/11 echocardiogram;- Left ventricle: mild LVH. LVEF= 35% to 40%. Diffuse hypokinesis.  - (grade 1 diastolic dysfunction). 8/12 extubated    Culture 8/10 Blood  >>> NGTD  8/10 Sputum >>>  8/10 Urine >>>NG 8/11 pleural fluid positive abundant Gram pos rods in pairs and abundant gram-negative rods     Antibiotics: Vancomycin 8/11>> Zosyn 8/11>>    DVT prophylaxis: SCDs   Devices    LINES / TUBES:  8/10 20ga right forearm  8/11 arterial line 8/11 right IJ double-lumen 8/11 Y chest tube 1/ 36 fr; CT #2/32 fr; CT #3/56fr>> discontinued by PCCM. Foley 8/11>>8/13    Continuous Infusions: . dexmedetomidine Stopped (09/08/13 1000)  . dextrose 5 % and 0.9 % NaCl with KCl 20 mEq/L 10 mL/hr at 09/10/13 0835  . norepinephrine (LEVOPHED) Adult infusion Stopped (09/08/13 2200)    Objective: VITAL SIGNS: Temp: 97.7 F (36.5 C) (08/16 1157) Temp src: Oral (08/16 1157) BP: 136/84 mmHg (08/16 0320) Pulse Rate: 112 (08/16 0320) SPO2;92% on room air FIO2:  Intake/Output Summary (Last 24 hours) at 09/12/13 1338 Last data filed at 09/12/13 1200  Gross per 24 hour  Intake    650 ml  Output   1575 ml  Net   -925 ml     Exam: General: A./O. x4, NAD, negative acute respiratory  distress.  Lungs: Clear to auscultation left lung field; clear to auscultation RUL, decreased breath sounds RML/RLL, with mild right basilar crackles. Chest tube site on right lateral thoracic wall covered and clean negative sign of infection all chest tubes discontinued.   Cardiovascular: Regular rhythm and rate without murmur gallop or rub normal S1 and S2 Abdomen: Nontender, nondistended, soft, bowel sounds positive, no rebound, no ascites, no appreciable mass Extremities: No significant cyanosis, clubbing, or edema bilateral lower extremities  Data Reviewed: Basic Metabolic Panel:  Recent Labs Lab 09/07/13 0216 09/07/13 0829 09/08/13 0400 09/09/13 0440 09/10/13 0700 09/11/13 0430 09/12/13 0300  NA 136* 139 141 138 141 141 142  K 3.3* 3.5* 4.1 3.6* 4.3 4.5 5.0  CL 102 104 109 106 106 107 111  CO2 20 20 22 21 24 21 23   GLUCOSE 155* 113* 139* 119* 115* 114* 312*  BUN 10 9 10 11 11 12 10   CREATININE 0.90 0.85 0.79 0.94 1.47* 1.61* 1.67*  CALCIUM 7.8* 8.2* 7.7* 7.7* 8.3* 8.7 7.8*  MG 2.0  --  2.2 2.2 2.0 2.2  --   PHOS  --   --  3.1 2.6 3.1 3.8  --    Liver Function Tests:  Recent Labs Lab 09/08/13 0400 09/09/13 0440 09/10/13 0700 09/11/13 0430 09/12/13 0300  AST 45* 48* 63* 86* 74*  ALT 19 19 21 29 28   ALKPHOS 111 82 91 103 87  BILITOT 0.6 0.5 0.5 0.5 0.3  PROT 5.5* 5.8* 6.8 6.7 5.6*  ALBUMIN 1.5* 1.6* 2.0* 1.9* 1.7*   No results found for this basename: LIPASE, AMYLASE,  in the last 168 hours No results found for this basename: AMMONIA,  in the last 168 hours CBC:  Recent Labs Lab 09/08/13 0400 09/09/13 0440 09/10/13 0545 09/11/13 0430 09/12/13 0300  WBC 22.5* 16.9* 15.1* 14.3* 13.6*  NEUTROABS 18.4* 11.9* 10.3* 9.6* 9.3*  HGB 8.1* 8.5* 9.1* 9.0* 7.7*  HCT 23.2* 25.0* 26.7* 26.8* 22.6*  MCV 95.9 98.4 96.0 100.0 99.1  PLT 331 391 461* 484* 427*   Cardiac Enzymes:  Recent Labs Lab 09/07/13 0216 09/07/13 0829 09/07/13 1800  TROPONINI <0.30 <0.30  <0.30   BNP (last 3 results)  Recent Labs  09/06/13 2109  PROBNP 982.1*   CBG: No results found for this basename: GLUCAP,  in the last 168 hours  Recent Results (from the past 240 hour(s))  CULTURE, BLOOD (ROUTINE X 2)     Status: None   Collection Time    09/06/13  8:59 PM      Result Value Ref Range Status   Specimen Description BLOOD HAND RIGHT   Final   Special Requests BOTTLES DRAWN AEROBIC AND ANAEROBIC 5CC   Final   Culture  Setup Time     Final   Value: 09/07/2013 03:14     Performed at [facility]   Culture     Final   Value:        BLOOD CULTURE RECEIVED NO GROWTH TO DATE CULTURE WILL BE HELD FOR 5 DAYS BEFORE ISSUING A FINAL NEGATIVE REPORT     Performed at [facility]   Report Status PENDING   Incomplete  CULTURE,  BLOOD (ROUTINE X 2)     Status: None   Collection Time    09/06/13  9:23 PM      Result Value Ref Range Status   Specimen Description BLOOD FOREARM RIGHT   Final   Special Requests BOTTLES DRAWN AEROBIC ONLY 2CC   Final   Culture  Setup Time     Final   Value: 09/07/2013 03:15     Performed at [facility]   Culture     Final   Value:        BLOOD CULTURE RECEIVED NO GROWTH TO DATE CULTURE WILL BE HELD FOR 5 DAYS BEFORE ISSUING A FINAL NEGATIVE REPORT     Performed at [facility]   Report Status PENDING   Incomplete  URINE CULTURE     Status: None   Collection Time    09/07/13  1:21 AM      Result Value Ref Range Status   Specimen Description URINE, CLEAN CATCH   Final   Special Requests NONE   Final   Culture  Setup Time     Final   Value: 09/07/2013 04:28     Performed at [place]     Final   Value: NO GROWTH     Performed at [facility]   Culture     Final   Value: NO GROWTH     Performed at [facility]   Report Status 09/08/2013 FINAL   Final  MRSA PCR SCREENING     Status: None   Collection Time    09/07/13  2:00 AM      Result Value Ref Range Status    MRSA by PCR NEGATIVE  NEGATIVE Final   Comment:            The GeneXpert MRSA Assay (FDA     approved for NASAL specimens     only), is one component of a     comprehensive MRSA colonization     surveillance program. It is not     intended to diagnose MRSA     infection nor to guide or     monitor treatment for     MRSA infections.  BODY FLUID CULTURE     Status: None   Collection Time    09/07/13 10:29 AM      Result Value Ref Range Status   Specimen Description FLUID RIGHT PLEURAL   Final   Special Requests POF VANCOMYCIN   Final   Gram Stain     Final   Value: NO WBC SEEN     ABUNDANT GRAM VARIABLE ROD     RARE GRAM POSITIVE COCCI     IN CHAINS Gram Stain Report Called to,Read Back By and Verified With: Gram Stain Report Called to,Read Back By and Verified With: CHRISTINE @ 6144 ON 09/10/2013 BY WEBSP     Performed at [facility]   Culture     Final   Value: RARE STREPTOCOCCUS GROUP F     Note: CRITICAL RESULT CALLED TO, READ BACK BY AND VERIFIED WITH: CHRISTINE     Performed at [facility]   Report Status 09/10/2013 FINAL   Final  ANAEROBIC CULTURE     Status: None   Collection Time    09/07/13 10:29 AM      Result Value Ref Range Status   Specimen Description FLUID RIGHT PLEURAL   Final   Special Requests POF VANCOMYCIN  Final   Gram Stain PENDING   Incomplete   Culture     Final   Value: NO ANAEROBES ISOLATED; CULTURE IN PROGRESS FOR 5 DAYS     Performed at [facility]   Report Status PENDING   Incomplete  TISSUE CULTURE     Status: None   Collection Time    09/07/13 10:30 AM      Result Value Ref Range Status   Specimen Description TISSUE RIGHT PLEURAL   Final   Special Requests POF ON VANCOMYCIN RIGHT PLEURAL PEEL   Final   Gram Stain     Final   Value: RARE WBC PRESENT, PREDOMINANTLY MONONUCLEAR     NO SQUAMOUS EPITHELIAL CELLS SEEN     ABUNDANT GRAM POSITIVE RODS     IN PAIRS ABUNDANT GRAM NEGATIVE RODS     FEW GRAM POSITIVE RODS    Culture     Final   Value: NO GROWTH 3 DAYS     Performed at [facility]   Report Status 09/10/2013 FINAL   Final  ANAEROBIC CULTURE     Status: None   Collection Time    09/07/13 10:30 AM      Result Value Ref Range Status   Specimen Description TISSUE RIGHT PLEURAL   Final   Special Requests POF ON VANCOMYCIN RIGHT PLEURAL PEEL   Final   Gram Stain     Final   Value: RARE WBC PRESENT, PREDOMINANTLY MONONUCLEAR     NO SQUAMOUS EPITHELIAL CELLS SEEN     ABUNDANT GRAM POSITIVE RODS     IN PAIRS ABUNDANT GRAM NEGATIVE RODS     FEW GRAM POSITIVE RODS   Culture     Final   Value: NO ANAEROBES ISOLATED; CULTURE IN PROGRESS FOR 5 DAYS     Performed at [facility]   Report Status PENDING   Incomplete  TISSUE CULTURE     Status: None   Collection Time    09/07/13 11:10 AM      Result Value Ref Range Status   Specimen Description TISSUE RIGHT PLEURAL   Final   Special Requests POF VANCOMYCIN RIGHT VISCERAL PLEURAL PEEL   Final   Gram Stain     Final   Value: NO WBC SEEN     NO SQUAMOUS EPITHELIAL CELLS SEEN     NO ORGANISMS SEEN     Performed at [facility]   Culture     Final   Value: NO GROWTH 3 DAYS     Performed at [facility]   Report Status 09/10/2013 FINAL   Final  ANAEROBIC CULTURE     Status: None   Collection Time    09/07/13 11:10 AM      Result Value Ref Range Status   Specimen Description TISSUE RIGHT PLEURAL   Final   Special Requests POF VANCOMYCIN RIGHT VISERSL PLEURAL PEEL   Final   Gram Stain     Final   Value: NO WBC SEEN     NO SQUAMOUS EPITHELIAL CELLS SEEN     NO ORGANISMS SEEN     Performed at [facility]   Culture     Final   Value: NO ANAEROBES ISOLATED     Performed at [facility]   Report Status 09/12/2013 FINAL   Final     Studies:  Recent x-ray studies have been reviewed in detail by the Attending Physician  Scheduled Meds:  Scheduled Meds: . feeding  supplement (RESOURCE BREEZE)  1  Container Oral TID BM  . pantoprazole (PROTONIX) IV  40 mg Intravenous Q12H  . piperacillin-tazobactam (ZOSYN)  IV  3.375 g Intravenous 3 times per day  . senna-docusate  2 tablet Oral QHS  . sodium chloride  3 mL Intravenous Q12H    Time spent on care of this patient: 40 mins   Allie Bossier , MD   [facility] Office  [phone] Pager [phone]  On-Call/Text Page:      [email or web address]      password [email or web address]  If 7PM-7AM, please contact night-coverage [email or web address] Password [email or web address] 09/12/2013, 1:38 PM   LOS: 6 days

## 2013-09-13 LAB — COMPREHENSIVE METABOLIC PANEL
ALBUMIN: 2 g/dL — AB (ref 3.5–5.2)
ALK PHOS: 104 U/L (ref 39–117)
ALT: 31 U/L (ref 0–53)
AST: 75 U/L — AB (ref 0–37)
Anion gap: 12 (ref 5–15)
BILIRUBIN TOTAL: 0.3 mg/dL (ref 0.3–1.2)
BUN: 9 mg/dL (ref 6–23)
CHLORIDE: 104 meq/L (ref 96–112)
CO2: 23 meq/L (ref 19–32)
Calcium: 8.3 mg/dL — ABNORMAL LOW (ref 8.4–10.5)
Creatinine, Ser: 1.57 mg/dL — ABNORMAL HIGH (ref 0.50–1.35)
GFR calc Af Amer: 57 mL/min — ABNORMAL LOW (ref >90)
GFR, EST NON AFRICAN AMERICAN: 49 mL/min — AB (ref >90)
Glucose, Bld: 106 mg/dL — ABNORMAL HIGH (ref 70–99)
POTASSIUM: 3.6 meq/L — AB (ref 3.7–5.3)
SODIUM: 139 meq/L (ref 137–147)
Total Protein: 6.4 g/dL (ref 6.0–8.3)

## 2013-09-13 LAB — ANAEROBIC CULTURE: Gram Stain: NONE SEEN

## 2013-09-13 LAB — CULTURE, BLOOD (ROUTINE X 2)
CULTURE: NO GROWTH
Culture: NO GROWTH

## 2013-09-13 LAB — CBC WITH DIFFERENTIAL/PLATELET
BASOS ABS: 0 10*3/uL (ref 0.0–0.1)
Basophils Relative: 0 % (ref 0–1)
Eosinophils Absolute: 0.1 10*3/uL (ref 0.0–0.7)
Eosinophils Relative: 1 % (ref 0–5)
HCT: 24 % — ABNORMAL LOW (ref 39.0–52.0)
Hemoglobin: 8 g/dL — ABNORMAL LOW (ref 13.0–17.0)
LYMPHS ABS: 3.8 10*3/uL (ref 0.7–4.0)
Lymphocytes Relative: 26 % (ref 12–46)
MCH: 32.4 pg (ref 26.0–34.0)
MCHC: 33.3 g/dL (ref 30.0–36.0)
MCV: 97.2 fL (ref 78.0–100.0)
MONO ABS: 1.2 10*3/uL — AB (ref 0.1–1.0)
Monocytes Relative: 8 % (ref 3–12)
Neutro Abs: 9.4 10*3/uL — ABNORMAL HIGH (ref 1.7–7.7)
Neutrophils Relative %: 65 % (ref 43–77)
PLATELETS: 534 10*3/uL — AB (ref 150–400)
RBC: 2.47 MIL/uL — ABNORMAL LOW (ref 4.22–5.81)
RDW: 14.3 % (ref 11.5–15.5)
WBC: 14.5 10*3/uL — ABNORMAL HIGH (ref 4.0–10.5)

## 2013-09-13 LAB — MAGNESIUM: Magnesium: 2.1 mg/dL (ref 1.5–2.5)

## 2013-09-13 LAB — OCCULT BLOOD X 1 CARD TO LAB, STOOL: Fecal Occult Bld: POSITIVE — AB

## 2013-09-13 MED ORDER — PANTOPRAZOLE SODIUM 40 MG PO TBEC
40.0000 mg | DELAYED_RELEASE_TABLET | Freq: Two times a day (BID) | ORAL | Status: DC
  Administered 2013-09-13 – 2013-09-17 (×8): 40 mg via ORAL
  Filled 2013-09-13 (×8): qty 1

## 2013-09-13 MED ORDER — ALBUTEROL SULFATE (2.5 MG/3ML) 0.083% IN NEBU: 2.5000 mg | INHALATION_SOLUTION | RESPIRATORY_TRACT | Status: DC | PRN

## 2013-09-13 MED ORDER — SODIUM CHLORIDE 0.9 % IV SOLN
INTRAVENOUS | Status: DC
  Administered 2013-09-16: 13:00:00 via INTRAVENOUS

## 2013-09-13 NOTE — Progress Notes (Signed)
Received orders to transfer pt. Pt aware of transfer. No s/s of acute distress noted, VS stable. Report called to [facility] on 6N.

## 2013-09-13 NOTE — Progress Notes (Signed)
6 Days Post-Op Procedure(s) (LRB): VIDEO ASSISTED THORACOSCOPY (VATS)/ DRAINAGE OF EMPYEMA/ DECORTICATION (Right) Subjective: Feels better this AM Still having some discomfort with BM  Objective: Vital signs in last 24 hours: Temp:  [97.7 F (36.5 C)-99 F (37.2 C)] 98.8 F (37.1 C) (08/17 0410) Pulse Rate:  [89-99] 99 (08/17 0410) Cardiac Rhythm:  [-] Normal sinus rhythm (08/17 0410) Resp:  [22-25] 23 (08/17 0410) BP: (140-146)/(78-83) 146/82 mmHg (08/17 0410) SpO2:  [93 %-96 %] 93 % (08/17 0410)  Hemodynamic parameters for last 24 hours:    Intake/Output from previous day: 08/16 0701 - 08/17 0700 In: 1680 [P.O.:1680] Out: 2675 [Urine:2675] Intake/Output this shift: Total I/O In: -  Out: 325 [Urine:325]  General appearance: alert and no distress Neurologic: intact Heart: regular rate and rhythm Lungs: diminished breath sounds right base Wound: intact  Lab Results:  Recent Labs  09/12/13 1435 09/13/13 0240  WBC 13.4* 14.5*  HGB 8.6* 8.0*  HCT 25.7* 24.0*  PLT 513* 534*   BMET:  Recent Labs  09/12/13 0300 09/13/13 0240  NA 142 139  K 5.0 3.6*  CL 111 104  CO2 23 23  GLUCOSE 312* 106*  BUN 10 9  CREATININE 1.67* 1.57*  CALCIUM 7.8* 8.3*    PT/INR: No results found for this basename: LABPROT, INR,  in the last 72 hours ABG    Component Value Date/Time   PHART 7.421 09/08/2013 0502   HCO3 23.3 09/08/2013 0502   TCO2 24.4 09/08/2013 0502   ACIDBASEDEF 0.6 09/08/2013 0502   O2SAT 94.2 09/08/2013 0502   CBG (last 3)  No results found for this basename: GLUCAP,  in the last 72 hours  Assessment/Plan: S/P Procedure(s) (LRB): VIDEO ASSISTED THORACOSCOPY (VATS)/ DRAINAGE OF EMPYEMA/ DECORTICATION (Right) - CV- stable  RESP- 93 % sat on RA  Chest x-ray looks good for post-decortication, some residual scarring/ thickening at right base as expected  ID- afebrile, WBC stable but hasn't gotten all the way back to normal, Day 7 of zosyn  RENAL- good UO,  creatinine down slihghtly  GI- Hgb stable. C/o discomfort with BM but frequency down and not as dark  DVT prophylaxis- SCD and ambulation   LOS: 7 days    Georganna Maxson C 09/13/2013

## 2013-09-13 NOTE — Care Management Note (Signed)
  Page 1 of 1   09/16/2013     11:09:36 AM CARE MANAGEMENT NOTE 09/16/2013  Patient:  Frank Solomon, Frank Solomon   Account Number:  [id number]  Date Initiated:  09/13/2013  Documentation initiated by:  Marvetta Gibbons  Subjective/Objective Assessment:   Pt admitted SOB, now s/p VATS on 8/11     Action/Plan:   PTA pt lived at home   Anticipated DC Date:  09/16/2013   Anticipated DC Plan:  [place]  CM consult      Choice offered to / List presented to:             Status of service:  In process, will continue to follow Medicare Important Message given?  YES (If response is "NO", the following Medicare IM given date fields will be blank) Date Medicare IM given:  09/13/2013 Medicare IM given by:  Marvetta Gibbons Date Additional Medicare IM given:  09/16/2013 Additional Medicare IM given by:  Magdalen Spatz  Discharge Disposition:  HOME/SELF CARE  Per UR Regulation:  Reviewed for med. necessity/level of care/duration of stay  If discussed at [place] of Stay Meetings, dates discussed:   09/14/2013  09/16/2013    Comments:  09/13/13 1100- Marvetta Gibbons RN, BSN [phone] pt tx to SDU on 09/11/13- no f/u recommendations by PT eval

## 2013-09-13 NOTE — Progress Notes (Deleted)
Utilization review completed.  

## 2013-09-13 NOTE — Progress Notes (Signed)
Utilization review completed.  

## 2013-09-13 NOTE — Progress Notes (Signed)
ANTIBIOTIC CONSULT NOTE - FOLLOW UP  Pharmacy Consult for Zosyn Indication: Empyema  No Known Allergies  Patient Measurements: Height: 6' (182.9 cm) Weight: 232 lb 12.9 oz (105.6 kg) IBW/kg (Calculated) : 77.6 Vital Signs: Temp: 98 F (36.7 C) (08/17 0841) Temp src: Oral (08/17 0841) BP: 142/78 mmHg (08/17 0841) Pulse Rate: 88 (08/17 0841) Intake/Output from previous day: 08/16 0701 - 08/17 0700 In: 1680 [P.O.:1680] Out: 2675 [Urine:2675] Intake/Output from this shift: Total I/O In: 120 [P.O.:120] Out: 550 [Urine:550]  Labs:  Recent Labs  09/11/13 0430 09/12/13 0300 09/12/13 1435 09/13/13 0240  WBC 14.3* 13.6* 13.4* 14.5*  HGB 9.0* 7.7* 8.6* 8.0*  PLT 484* 427* 513* 534*  CREATININE 1.61* 1.67*  --  1.57*   Estimated Creatinine Clearance: 69.1 ml/min (by C-G formula based on Cr of 1.57). No results found for this basename: VANCOTROUGH, VANCOPEAK, VANCORANDOM, GENTTROUGH, GENTPEAK, GENTRANDOM, TOBRATROUGH, TOBRAPEAK, TOBRARND, AMIKACINPEAK, AMIKACINTROU, AMIKACIN,  in the last 72 hours  Assessment: 75 YOM with empyema s/p VATS and drainage on day #7 zosyn. Afebrile. WBC 14.5. Scr elevated but relatively stable at 1.57.  CTX 8/10 >> 8/11 Azith 8/10 >> 8/11 Vanc 8/11 >> 8/14 (d/c'd by CCM on 8/14) 8/12 VT 12.9  Zosyn 8/11 >>  8/10 Bcx >> NEG 8/11 Ucx >>NEG 8/11 Pleural fluid cx >> rare strep  Goal of Therapy:  Clinical resolution of infection  Plan:  Continue Zosyn 3.375g IV q8h- each dose over 4 hours.   Pharmacy will sign-off as not dose adjustments anticipated. Thank you for the consult and please re-consult [place] if needed.   Salome Arnt, PharmD, BCPS Pager # [phone] 09/13/2013 10:56 AM

## 2013-09-13 NOTE — Progress Notes (Signed)
Physical Therapy Treatment Patient Details Name: Trevion Hoben MRN: [id number] DOB: [birth year] Today's Date: 09/13/2013    History of Present Illness Pt adm with rt empyema and underwent rt VATS and decortication on 09/07/13. PMH-CHF, TIA    PT Comments    Pt much improved with mobility today and only limited by one instance of spasm sensation in R side, otherwise pt states he doesn't have any pain.  Will continue to follow.    Follow Up Recommendations  No PT follow up     Equipment Recommendations  None recommended by PT    Recommendations for Other Services       Precautions / Restrictions Precautions Precautions: None Restrictions Weight Bearing Restrictions: No    Mobility  Bed Mobility Overal bed mobility: Needs Assistance Bed Mobility: Supine to Sit;Sit to Supine     Supine to sit: Supervision Sit to supine: Supervision   General bed mobility comments: pt needs increased time and HOB elevated to complete without A.    Transfers Overall transfer level: Needs assistance Equipment used: Rolling walker (2 wheeled) Transfers: Sit to/from Stand Sit to Stand: Supervision         General transfer comment: cues to get closer prior to sitting and control descent to sitting.    Ambulation/Gait Ambulation/Gait assistance: Min guard Ambulation Distance (Feet): 300 Feet Assistive device: Rolling walker (2 wheeled) Gait Pattern/deviations: Step-through pattern;Decreased stride length   Gait velocity interpretation: Below normal speed for age/gender General Gait Details: pt moving well though tends to lean on RW.  pt with one episode of spasm sensation in R side during ambulation requiring pt to take standing rest break, but then was able to continue ambulating.     Stairs            Wheelchair Mobility    Modified Rankin (Stroke Patients Only)       Balance                                    Cognition Arousal/Alertness:  Awake/alert Behavior During Therapy: WFL for tasks assessed/performed Overall Cognitive Status: Within Functional Limits for tasks assessed                      Exercises      General Comments        Pertinent Vitals/Pain Pain Assessment: No/denies pain (pt indicates only occasional spasm.  )    Home Living                      Prior Function            PT Goals (current goals can now be found in the care plan section) Acute Rehab PT Goals PT Goal Formulation: With patient Time For Goal Achievement: 09/16/13 Potential to Achieve Goals: Good Progress towards PT goals: Progressing toward goals    Frequency  Min 3X/week    PT Plan Current plan remains appropriate    Co-evaluation             End of Session   Activity Tolerance: Patient tolerated treatment well Patient left: in bed;with call bell/phone within reach     Time: 0919-0933 PT Time Calculation (min): 14 min  Charges:  $Gait Training: 8-22 mins                    G Codes:  Catarina Hartshorn, [place] 09/13/2013, 9:35 AM

## 2013-09-13 NOTE — Progress Notes (Signed)
[place] TEAM 1 - Stepdown/ICU TEAM Progress Note  Frank Solomon NTI:[id number] DOB: September 05, 1960 DOA: 09/06/2013 PCP: No primary provider on file.  Admit HPI / Brief Narrative: 53 yo M w/ Hx Tobacco abuse, CHF, TIA who presented with acute resp failure. Pt reported sudden onset of CP and SOB over 2-3 days. Denied any trauma.  On presentation to the ER. tmax 99.9, HR 100s-120s, RR- 20s-40, SBP 100s-130s. Satting > 95% on 3L. WBC 33.2, hgb 11, K 3.3, Cr 0.92. Trop and lactate WNL. CT angio of the chest noted 11x12 cm RLL air fluid level w/ presumed necrotic tissue in addition to R loculated empyema with infectious ground glass opacities and reactive mediastinal LAD. + mild mediastinal L shift.  HPI/Subjective: Pt reports ongoing expected chest wall pain.  He also reports another dark stool this morning.    Assessment/Plan:  Acute hypoxic respiratory failure due to R empyema  -S/P right VATS - post-op care per TCTS -abx selection and tx course per TCTS  -Pleural fluid positive abundant Gram pos rods in pairs and abundant gram-negative rods; speciation and susceptibilities pending   Combined systolic and diastolic CHF -See echocardiogram below - EF 35-40% w/ grade 1 DD -well compensated at present   HTN -Within AHA guidelines presently   GI bleed -Lovenox stopped - Hgb continues to wax and wane - pt is guiac + and reports dark stool again this morning - consulted GI for evaluation (Dr. Collene Mares) who suggested via phone that we continue to monitor the pt and consider endoscopic eval in f/u as outpt due to close proximity to his recent VATS - GI asked that we call back if ongoing bleeding that would necessitate urgent endoscopy is encountered - pt denies prior colo/EGD   Hypokalemia  -Mg normal - K+ waxing and waning - recheck in AM w/o supplementation today   Code Status: FULL Family Communication: Brother present at time of exam Disposition Plan: stable for transfer to medical bed - cont to  monitor resp status and Hgb   Consultants: Dr. Modesto Charon (cardiothoracic surgery) Dr.Vishal Mungal (PCCM) GI - Dr. Collene Mares - via phone   Procedure/Significant Events: 8/10 CXR >>> b/l opacities, 3x4cm oval structure in RLL 8/10 Chest CTA >>> 11 x 18mm right lung base probable abscess with air fluid level, large right loculated pleural effusion vs empyema.  8/11VIDEO ASSISTED THORACOSCOPY (VATS)/ DRAINAGE OF EMPYEMA/ DECORTICATION (Right) 8/11 echocardiogram;- Left ventricle: mild LVH. LVEF= 35% to 40%. Diffuse hypokinesis.  - (grade 1 diastolic dysfunction). 8/12 extubated  Antibiotics: Zosyn 8/10 >>  DVT prophylaxis: SCDs  Objective: VITAL SIGNS: Blood pressure 137/81, pulse 95, temperature 97.8 F (36.6 C), temperature source Oral, resp. rate 22, height 6' (1.829 m), weight 105.6 kg (232 lb 12.9 oz), SpO2 94.00%.  Intake/Output Summary (Last 24 hours) at 09/13/13 1330 Last data filed at 09/13/13 1149  Gross per 24 hour  Intake   1320 ml  Output   3425 ml  Net  -2105 ml   Exam: General: NAD, negative acute respiratory distress.  Lungs: Clear to auscultation left lung field; clear to auscultation RUL, decreased breath sounds RML/RLL, with mild right basilar crackles Cardiovascular: Regular rhythm and rate without murmur gallop or rub normal S1 and S2 Abdomen: Nontender, nondistended, soft, bowel sounds positive, no rebound, no ascites, no appreciable mass Extremities: No significant cyanosis, clubbing, or edema bilateral lower extremities  Data Reviewed: Basic Metabolic Panel:  Recent Labs Lab 09/07/13 0829 09/08/13 0400 09/09/13 0440 09/10/13 0700 09/11/13 0430 09/12/13  0300 09/13/13 0240  NA 139 141 138 141 141 142 139  K 3.5* 4.1 3.6* 4.3 4.5 5.0 3.6*  CL 104 109 106 106 107 111 104  CO2 20 22 21 24 21 23 23   GLUCOSE 113* 139* 119* 115* 114* 312* 106*  BUN 9 10 11 11 12 10 9   CREATININE 0.85 0.79 0.94 1.47* 1.61* 1.67* 1.57*  CALCIUM 8.2* 7.7* 7.7*  8.3* 8.7 7.8* 8.3*  MG  --  2.2 2.2 2.0 2.2  --  2.1  PHOS  --  3.1 2.6 3.1 3.8  --   --    Liver Function Tests:  Recent Labs Lab 09/09/13 0440 09/10/13 0700 09/11/13 0430 09/12/13 0300 09/13/13 0240  AST 48* 63* 86* 74* 75*  ALT 19 21 29 28 31   ALKPHOS 82 91 103 87 104  BILITOT 0.5 0.5 0.5 0.3 0.3  PROT 5.8* 6.8 6.7 5.6* 6.4  ALBUMIN 1.6* 2.0* 1.9* 1.7* 2.0*   CBC:  Recent Labs Lab 09/09/13 0440 09/10/13 0545 09/11/13 0430 09/12/13 0300 09/12/13 1435 09/13/13 0240  WBC 16.9* 15.1* 14.3* 13.6* 13.4* 14.5*  NEUTROABS 11.9* 10.3* 9.6* 9.3*  --  9.4*  HGB 8.5* 9.1* 9.0* 7.7* 8.6* 8.0*  HCT 25.0* 26.7* 26.8* 22.6* 25.7* 24.0*  MCV 98.4 96.0 100.0 99.1 100.0 97.2  PLT 391 461* 484* 427* 513* 534*   Cardiac Enzymes:  Recent Labs Lab 09/07/13 0216 09/07/13 0829 09/07/13 1800  TROPONINI <0.30 <0.30 <0.30   BNP (last 3 results)  Recent Labs  09/06/13 2109  PROBNP 982.1*    Recent Results (from the past 240 hour(s))  CULTURE, BLOOD (ROUTINE X 2)     Status: None   Collection Time    09/06/13  8:59 PM      Result Value Ref Range Status   Specimen Description BLOOD HAND RIGHT   Final   Special Requests BOTTLES DRAWN AEROBIC AND ANAEROBIC 5CC   Final   Culture  Setup Time     Final   Value: 09/07/2013 03:14     Performed at [facility]   Culture     Final   Value: NO GROWTH 5 DAYS     Performed at [facility]   Report Status 09/13/2013 FINAL   Final  CULTURE, BLOOD (ROUTINE X 2)     Status: None   Collection Time    09/06/13  9:23 PM      Result Value Ref Range Status   Specimen Description BLOOD FOREARM RIGHT   Final   Special Requests BOTTLES DRAWN AEROBIC ONLY 2CC   Final   Culture  Setup Time     Final   Value: 09/07/2013 03:15     Performed at [facility]   Culture     Final   Value: NO GROWTH 5 DAYS     Performed at [facility]   Report Status 09/13/2013 FINAL   Final  URINE CULTURE     Status: None    Collection Time    09/07/13  1:21 AM      Result Value Ref Range Status   Specimen Description URINE, CLEAN CATCH   Final   Special Requests NONE   Final   Culture  Setup Time     Final   Value: 09/07/2013 04:28     Performed at [place]     Final   Value: NO GROWTH     Performed at [facility]   Culture     Final   Value: NO GROWTH     Performed at [facility]   Report Status 09/08/2013 FINAL   Final  MRSA PCR SCREENING     Status: None   Collection Time    09/07/13  2:00 AM      Result Value Ref Range Status   MRSA by PCR NEGATIVE  NEGATIVE Final   Comment:            The GeneXpert MRSA Assay (FDA     approved for NASAL specimens     only), is one component of a     comprehensive MRSA colonization     surveillance program. It is not     intended to diagnose MRSA     infection nor to guide or     monitor treatment for     MRSA infections.  BODY FLUID CULTURE     Status: None   Collection Time    09/07/13 10:29 AM      Result Value Ref Range Status   Specimen Description FLUID RIGHT PLEURAL   Final   Special Requests POF VANCOMYCIN   Final   Gram Stain     Final   Value: NO WBC SEEN     ABUNDANT GRAM VARIABLE ROD     RARE GRAM POSITIVE COCCI     IN CHAINS Gram Stain Report Called to,Read Back By and Verified With: Gram Stain Report Called to,Read Back By and Verified With: [place] @ 0258 ON 09/10/2013 BY WEBSP     Performed at [facility]   Culture     Final   Value: RARE STREPTOCOCCUS GROUP F     Note: CRITICAL RESULT CALLED TO, READ BACK BY AND VERIFIED WITH: CHRISTINE     Performed at [facility]   Report Status 09/10/2013 FINAL   Final  ANAEROBIC CULTURE     Status: None   Collection Time    09/07/13 10:29 AM      Result Value Ref Range Status   Specimen Description FLUID RIGHT PLEURAL   Final   Special Requests POF VANCOMYCIN   Final   Gram Stain     Final   Value: NO WBC SEEN     ABUNDANT  GRAM VARIABLE ROD     RARE GRAM POSITIVE COCCI     IN CHAINS     Performed at [facility]   Culture     Final   Value: NO ANAEROBES ISOLATED     Performed at [facility]   Report Status 09/13/2013 FINAL   Final  TISSUE CULTURE     Status: None   Collection Time    09/07/13 10:30 AM      Result Value Ref Range Status   Specimen Description TISSUE RIGHT PLEURAL   Final   Special Requests POF ON VANCOMYCIN RIGHT PLEURAL PEEL   Final   Gram Stain     Final   Value: RARE WBC PRESENT, PREDOMINANTLY MONONUCLEAR     NO SQUAMOUS EPITHELIAL CELLS SEEN     ABUNDANT GRAM POSITIVE RODS     IN PAIRS ABUNDANT GRAM NEGATIVE RODS     FEW GRAM POSITIVE RODS   Culture     Final   Value: NO GROWTH 3 DAYS     Performed at [facility]   Report Status 09/10/2013 FINAL   Final  ANAEROBIC CULTURE     Status: None  Collection Time    09/07/13 10:30 AM      Result Value Ref Range Status   Specimen Description TISSUE RIGHT PLEURAL   Final   Special Requests POF ON VANCOMYCIN RIGHT PLEURAL PEEL   Final   Gram Stain     Final   Value: RARE WBC PRESENT, PREDOMINANTLY MONONUCLEAR     NO SQUAMOUS EPITHELIAL CELLS SEEN     ABUNDANT GRAM POSITIVE RODS     IN PAIRS ABUNDANT GRAM NEGATIVE RODS     FEW GRAM POSITIVE RODS   Culture     Final   Value: NO ANAEROBES ISOLATED; CULTURE IN PROGRESS FOR 5 DAYS     Performed at [facility]   Report Status PENDING   Incomplete  TISSUE CULTURE     Status: None   Collection Time    09/07/13 11:10 AM      Result Value Ref Range Status   Specimen Description TISSUE RIGHT PLEURAL   Final   Special Requests POF VANCOMYCIN RIGHT VISCERAL PLEURAL PEEL   Final   Gram Stain     Final   Value: NO WBC SEEN     NO SQUAMOUS EPITHELIAL CELLS SEEN     NO ORGANISMS SEEN     Performed at [facility]   Culture     Final   Value: NO GROWTH 3 DAYS     Performed at [facility]   Report Status 09/10/2013 FINAL   Final    ANAEROBIC CULTURE     Status: None   Collection Time    09/07/13 11:10 AM      Result Value Ref Range Status   Specimen Description TISSUE RIGHT PLEURAL   Final   Special Requests POF VANCOMYCIN RIGHT VISERSL PLEURAL PEEL   Final   Gram Stain     Final   Value: NO WBC SEEN     NO SQUAMOUS EPITHELIAL CELLS SEEN     NO ORGANISMS SEEN     Performed at [facility]   Culture     Final   Value: NO ANAEROBES ISOLATED     Performed at [facility]   Report Status 09/12/2013 FINAL   Final     Studies:  Recent x-ray studies have been reviewed in detail by the Attending Physician  Scheduled Meds:  Scheduled Meds: . feeding supplement (RESOURCE BREEZE)  1 Container Oral TID BM  . pantoprazole (PROTONIX) IV  40 mg Intravenous Q12H  . piperacillin-tazobactam (ZOSYN)  IV  3.375 g Intravenous 3 times per day  . senna-docusate  2 tablet Oral QHS  . sodium chloride  3 mL Intravenous Q12H    Time spent on care of this patient: 35 mins  Cherene Altes, MD [facility] For Consults/Admissions - Flow Manager - [phone] Office  [phone] Pager [phone]  On-Call/Text Page:      [email or web address]      password [facility]  09/13/2013, 1:30 PM   LOS: 7 days

## 2013-09-14 LAB — CBC
HCT: 24 % — ABNORMAL LOW (ref 39.0–52.0)
HEMOGLOBIN: 8.1 g/dL — AB (ref 13.0–17.0)
MCH: 32.8 pg (ref 26.0–34.0)
MCHC: 33.8 g/dL (ref 30.0–36.0)
MCV: 97.2 fL (ref 78.0–100.0)
Platelets: 539 10*3/uL — ABNORMAL HIGH (ref 150–400)
RBC: 2.47 MIL/uL — ABNORMAL LOW (ref 4.22–5.81)
RDW: 14.6 % (ref 11.5–15.5)
WBC: 11.7 10*3/uL — ABNORMAL HIGH (ref 4.0–10.5)

## 2013-09-14 LAB — ANAEROBIC CULTURE

## 2013-09-14 LAB — BASIC METABOLIC PANEL
Anion gap: 10 (ref 5–15)
BUN: 10 mg/dL (ref 6–23)
CALCIUM: 8.6 mg/dL (ref 8.4–10.5)
CO2: 25 mEq/L (ref 19–32)
Chloride: 107 mEq/L (ref 96–112)
Creatinine, Ser: 1.6 mg/dL — ABNORMAL HIGH (ref 0.50–1.35)
GFR calc Af Amer: 56 mL/min — ABNORMAL LOW (ref >90)
GFR, EST NON AFRICAN AMERICAN: 48 mL/min — AB (ref >90)
GLUCOSE: 112 mg/dL — AB (ref 70–99)
Potassium: 4.3 mEq/L (ref 3.7–5.3)
Sodium: 142 mEq/L (ref 137–147)

## 2013-09-14 MED ORDER — CARVEDILOL 12.5 MG PO TABS
12.5000 mg | ORAL_TABLET | Freq: Two times a day (BID) | ORAL | Status: DC
Start: 2013-09-14 — End: 2013-09-17
  Administered 2013-09-14 – 2013-09-17 (×6): 12.5 mg via ORAL
  Filled 2013-09-14 (×8): qty 1

## 2013-09-14 MED ORDER — FUROSEMIDE 20 MG PO TABS
20.0000 mg | ORAL_TABLET | Freq: Every day | ORAL | Status: DC
  Administered 2013-09-14 – 2013-09-17 (×4): 20 mg via ORAL
  Filled 2013-09-14 (×5): qty 1

## 2013-09-14 NOTE — Progress Notes (Signed)
[address]       [place] [address]             [phone]      7 Days Post-Op Procedure(s) (LRB): VIDEO ASSISTED THORACOSCOPY (VATS)/ DRAINAGE OF EMPYEMA/ DECORTICATION (Right) Subjective: Feels ok, pain mostly controlled, some right upper quadrant discomfort  Objective: Vital signs in last 24 hours: Temp:  [97.8 F (36.6 C)-98.6 F (37 C)] 98.6 F (37 C) (08/18 0508) Pulse Rate:  [82-95] 89 (08/18 0508) Cardiac Rhythm:  [-]  Resp:  [19-22] 20 (08/18 0508) BP: (137-151)/(78-86) 147/78 mmHg (08/18 0508) SpO2:  [94 %-98 %] 98 % (08/18 0508)  Hemodynamic parameters for last 24 hours:    Intake/Output from previous day: 08/17 0701 - 08/18 0700 In: 1320 [P.O.:1320] Out: 5075 [Urine:5075] Intake/Output this shift:    General appearance: alert, cooperative and no distress Heart: regular rate and rhythm Lungs: di right>left base Abdomen: mild ruq tenderness, no guarding/rebound + BS, mild distension Extremities: no edema Wound: incis healing well  Lab Results:  Recent Labs  09/13/13 0240 09/14/13 0435  WBC 14.5* 11.7*  HGB 8.0* 8.1*  HCT 24.0* 24.0*  PLT 534* 539*   BMET:  Recent Labs  09/13/13 0240 09/14/13 0435  NA 139 142  K 3.6* 4.3  CL 104 107  CO2 23 25  GLUCOSE 106* 112*  BUN 9 10  CREATININE 1.57* 1.60*  CALCIUM 8.3* 8.6    PT/INR: No results found for this basename: LABPROT, INR,  in the last 72 hours ABG    Component Value Date/Time   PHART 7.421 09/08/2013 0502   HCO3 23.3 09/08/2013 0502   TCO2 24.4 09/08/2013 0502   ACIDBASEDEF 0.6 09/08/2013 0502   O2SAT 94.2 09/08/2013 0502   CBG (last 3)  No results found for this basename: GLUCAP,  in the last 72 hours Results for orders placed during the hospital encounter of 09/06/13  CULTURE, BLOOD (ROUTINE X 2)     Status: None   Collection Time    09/06/13  8:59 PM      Result Value Ref Range Status   Specimen Description BLOOD HAND RIGHT   Final   Special  Requests BOTTLES DRAWN AEROBIC AND ANAEROBIC 5CC   Final   Culture  Setup Time     Final   Value: 09/07/2013 03:14     Performed at [facility]   Culture     Final   Value: NO GROWTH 5 DAYS     Performed at [facility]   Report Status 09/13/2013 FINAL   Final  CULTURE, BLOOD (ROUTINE X 2)     Status: None   Collection Time    09/06/13  9:23 PM      Result Value Ref Range Status   Specimen Description BLOOD FOREARM RIGHT   Final   Special Requests BOTTLES DRAWN AEROBIC ONLY 2CC   Final   Culture  Setup Time     Final   Value: 09/07/2013 03:15     Performed at [place]     Final   Value: NO GROWTH 5 DAYS     Performed at [facility]   Report Status 09/13/2013 FINAL   Final  URINE CULTURE     Status: None   Collection Time    09/07/13  1:21 AM      Result Value Ref Range Status   Specimen Description URINE, CLEAN CATCH  Final   Special Requests NONE   Final   Culture  Setup Time     Final   Value: 09/07/2013 04:28     Performed at [place]     Final   Value: NO GROWTH     Performed at [facility]   Culture     Final   Value: NO GROWTH     Performed at [facility]   Report Status 09/08/2013 FINAL   Final  MRSA PCR SCREENING     Status: None   Collection Time    09/07/13  2:00 AM      Result Value Ref Range Status   MRSA by PCR NEGATIVE  NEGATIVE Final   Comment:            The GeneXpert MRSA Assay (FDA     approved for NASAL specimens     only), is one component of a     comprehensive MRSA colonization     surveillance program. It is not     intended to diagnose MRSA     infection nor to guide or     monitor treatment for     MRSA infections.  BODY FLUID CULTURE     Status: None   Collection Time    09/07/13 10:29 AM      Result Value Ref Range Status   Specimen Description FLUID RIGHT PLEURAL   Final   Special Requests POF VANCOMYCIN   Final   Gram Stain     Final    Value: NO WBC SEEN     ABUNDANT GRAM VARIABLE ROD     RARE GRAM POSITIVE COCCI     IN CHAINS Gram Stain Report Called to,Read Back By and Verified With: Gram Stain Report Called to,Read Back By and Verified With: [place] @ 9924 ON 09/10/2013 BY WEBSP     Performed at [facility]   Culture     Final   Value: RARE STREPTOCOCCUS GROUP F     Note: CRITICAL RESULT CALLED TO, READ BACK BY AND VERIFIED WITH: CHRISTINE     Performed at [facility]   Report Status 09/10/2013 FINAL   Final  ANAEROBIC CULTURE     Status: None   Collection Time    09/07/13 10:29 AM      Result Value Ref Range Status   Specimen Description FLUID RIGHT PLEURAL   Final   Special Requests POF VANCOMYCIN   Final   Gram Stain     Final   Value: NO WBC SEEN     ABUNDANT GRAM VARIABLE ROD     RARE GRAM POSITIVE COCCI     IN CHAINS     Performed at [facility]   Culture     Final   Value: NO ANAEROBES ISOLATED     Performed at [facility]   Report Status 09/13/2013 FINAL   Final  TISSUE CULTURE     Status: None   Collection Time    09/07/13 10:30 AM      Result Value Ref Range Status   Specimen Description TISSUE RIGHT PLEURAL   Final   Special Requests POF ON VANCOMYCIN RIGHT PLEURAL PEEL   Final   Gram Stain     Final   Value: RARE WBC PRESENT, PREDOMINANTLY MONONUCLEAR     NO SQUAMOUS EPITHELIAL CELLS SEEN     ABUNDANT GRAM POSITIVE RODS     IN  PAIRS ABUNDANT GRAM NEGATIVE RODS     FEW GRAM POSITIVE RODS   Culture     Final   Value: NO GROWTH 3 DAYS     Performed at [facility]   Report Status 09/10/2013 FINAL   Final  ANAEROBIC CULTURE     Status: None   Collection Time    09/07/13 10:30 AM      Result Value Ref Range Status   Specimen Description TISSUE RIGHT PLEURAL   Final   Special Requests POF ON VANCOMYCIN RIGHT PLEURAL PEEL   Final   Gram Stain     Final   Value: RARE WBC PRESENT, PREDOMINANTLY MONONUCLEAR     NO SQUAMOUS EPITHELIAL CELLS SEEN      ABUNDANT GRAM POSITIVE RODS     IN PAIRS ABUNDANT GRAM NEGATIVE RODS     FEW GRAM POSITIVE RODS   Culture     Final   Value: NO ANAEROBES ISOLATED; CULTURE IN PROGRESS FOR 5 DAYS     Performed at [facility]   Report Status PENDING   Incomplete  TISSUE CULTURE     Status: None   Collection Time    09/07/13 11:10 AM      Result Value Ref Range Status   Specimen Description TISSUE RIGHT PLEURAL   Final   Special Requests POF VANCOMYCIN RIGHT VISCERAL PLEURAL PEEL   Final   Gram Stain     Final   Value: NO WBC SEEN     NO SQUAMOUS EPITHELIAL CELLS SEEN     NO ORGANISMS SEEN     Performed at [facility]   Culture     Final   Value: NO GROWTH 3 DAYS     Performed at [facility]   Report Status 09/10/2013 FINAL   Final  ANAEROBIC CULTURE     Status: None   Collection Time    09/07/13 11:10 AM      Result Value Ref Range Status   Specimen Description TISSUE RIGHT PLEURAL   Final   Special Requests POF VANCOMYCIN RIGHT VISERSL PLEURAL PEEL   Final   Gram Stain     Final   Value: NO WBC SEEN     NO SQUAMOUS EPITHELIAL CELLS SEEN     NO ORGANISMS SEEN     Performed at [facility]   Culture     Final   Value: NO ANAEROBES ISOLATED     Performed at [facility]   Report Status 09/12/2013 FINAL   Final   Meds Scheduled Meds: . feeding supplement (RESOURCE BREEZE)  1 Container Oral TID BM  . pantoprazole  40 mg Oral BID  . piperacillin-tazobactam (ZOSYN)  IV  3.375 g Intravenous 3 times per day  . senna-docusate  2 tablet Oral QHS  . sodium chloride  3 mL Intravenous Q12H   Continuous Infusions: . sodium chloride 10 mL/hr at 09/13/13 1417   PRN Meds:.albuterol, fentaNYL, ondansetron (ZOFRAN) IV, oxyCODONE, senna  Xrays No results found.  Assessment/Plan: S/P Procedure(s) (LRB): VIDEO ASSISTED THORACOSCOPY (VATS)/ DRAINAGE OF EMPYEMA/ DECORTICATION (Right)  1 making steady progress 2 day 8 of zosyn, minimal leukocytosis,  afebrile- cont to push rehab/pulm toilet 3 H/H stable- medicine managing GI bleeding       LOS: 8 days    GOLD,WAYNE E 09/14/2013

## 2013-09-14 NOTE — Progress Notes (Addendum)
[facility] PROGRESS NOTE  Add Dinapoli BJS:[id number] DOB: Jun 08, 1960 DOA: 09/06/2013 PCP: No primary provider on file.  Admit HPI / Brief Narrative:  53 yo M w/ Hx Tobacco abuse, CHF, TIA who presented with acute resp failure. Pt reported sudden onset of CP and SOB over 2-3 days. Denied any trauma. On presentation to the ER. tmax 99.9, HR 100s-120s, RR- 20s-40, SBP 100s-130s. Satting > 95% on 3L. WBC 33.2, hgb 11, K 3.3, Cr 0.92. Trop and lactate WNL. CT angio of the chest noted 11x12 cm RLL air fluid level w/ presumed necrotic tissue in addition to R loculated empyema with infectious ground glass opacities and reactive mediastinal LAD. + mild mediastinal L shift.  CTS was consulted and patient underwent VATS on 8/11 and was extubated on 8/12.  He required vasopressors for 1-2 days, but currently stable on RA and resuming blood pressure medications.  On monotherapy zosyn.  Home once cleared by surgery.  If planning for long course of IV antibiotics, will need PICC line, otherwise, please d/c CVC ASAP.  Also had some melena but GI wants to have outpatient follow up due to pulmonary issues.  Hemoglobin stable.    Assessment/Plan  Septic shock and acute hypoxic respiratory failure due to R empyema, vasopressors off and on RA, improving. -  S/P right VATS - post-op care per TCTS  -  abx selection and tx course per TCTS, zosyn day 8 -  Body fluid cx growing Strep group F -  Tissue culture with abundant Gram pos rods in pairs and abundant gram-negative rods; speciation and susceptibilities pending   RUQ pain, likely due to fluid above dome of liver -  Mild transaminitis appears about stable, bilirubin stable -  [place] 8/14 with sludge, no evidence of cholecystitis, just fluid above dome of liver which is probably reactive from lung issues. -  Consider repeating US in a few days to see if resolving with antibiotics and/or place drain if persisting  Combined systolic and diastolic CHF, EF 60-73% w/  grade 1 DD with lower extremity edema -  Holding ACEI due to kidney function for now -  Resume BB -  Continue to hold norvasc -  Start low dose lasix and trend creatinine carefully -  Continue daily weights  HTN, blood pressure mildly elevated -  Resume beta blocker  GI bleed, Lovenox stopped - Hgb continues to wax and wane - pt is guiac + - Dr. Collene Mares, GI, recommended we continue to monitor the pt and consider endoscopic eval in f/u as outpt due to close proximity to his recent VATS -  Hgb approximately stable around 8.1mg /dl this morning  Hypokalemia, resolved this AM -Mg normal  Leukocytosis and thrombocytosis due to empyema, resolving.    AKI, likey due to ATN from septic shock and antibiotic exposure -  Creatinine stable -  Minimize nephrotoxins -  Renally dose medications  Diet:  Healthy heart Access:  Right IJ CVC >> if prolonged IV antibiotics, consider PICC line, otherwise PIV IVF:  off Proph:  SCDs  Code Status: full code Family Communication: patient alone Disposition Plan: continue med-surg    Consultants:  Dr. Modesto Charon (cardiothoracic surgery)  Dr.Vishal Mungal (PCCM)  GI - Dr. Collene Mares - via phone  Procedure/Significant Events:  8/10 CXR >>> b/l opacities, 3x4cm oval structure in RLL 8/10 Chest CTA >>> 11 x 57mm right lung base probable abscess with air fluid level, large right loculated pleural effusion vs empyema.  8/11VIDEO ASSISTED THORACOSCOPY (VATS)/ DRAINAGE OF  EMPYEMA/ DECORTICATION (Right)  8/11 echocardiogram;- Left ventricle: mild LVH. LVEF= 35% to 40%. Diffuse hypokinesis.  grade 1 diastolic dysfunction 3/82 extubated  Antibiotics:  Zosyn 8/10 >>  HPI/Subjective:  Poor appetite but eating.  Denies nausea, vomiting.  Has pain in RUQ with deep breathing a fullness sensation in that area.  Denies constipation or diarrhea.  Cough improving and denies SOB.  Has been ambulating in halls.    Objective: Filed Vitals:   09/13/13 1147 09/13/13  1510 09/13/13 2103 09/14/13 0508  BP: 137/81 151/86 140/81 147/78  Pulse: 95 82 93 89  Temp: 97.8 F (36.6 C) 98.2 F (36.8 C) 98.4 F (36.9 C) 98.6 F (37 C)  TempSrc: Oral Oral Oral Oral  Resp: 22 20 19 20   Height:      Weight:      SpO2: 94% 96% 98% 98%    Intake/Output Summary (Last 24 hours) at 09/14/13 1034 Last data filed at 09/14/13 0950  Gross per 24 hour  Intake   1200 ml  Output   5050 ml  Net  -3850 ml   Filed Weights   09/09/13 0500 09/10/13 0630 09/11/13 0400  Weight: 108.5 kg (239 lb 3.2 oz) 105.9 kg (233 lb 7.5 oz) 105.6 kg (232 lb 12.9 oz)    Exam:   General:  BM, No acute distress  HEENT:  NCAT, MMM  Cardiovascular:  RRR, nl S1, S2 no mrg, 2+ pulses, warm extremities  Respiratory:  diminshed at right base with course rales and rhonchi at the mid-back, no wheezes, clear left lung fields, no increased WOB  Abdomen:   NABS, soft, TTP in RUQ without rebound or guarding, mildly distended upper abdomen  MSK:   Normal tone and bulk, 1+ bilateral LEE  Neuro:  Grossly intact  Data Reviewed: Basic Metabolic Panel:  Recent Labs Lab 09/08/13 0400 09/09/13 0440 09/10/13 0700 09/11/13 0430 09/12/13 0300 09/13/13 0240 09/14/13 0435  NA 141 138 141 141 142 139 142  K 4.1 3.6* 4.3 4.5 5.0 3.6* 4.3  CL 109 106 106 107 111 104 107  CO2 22 21 24 21 23 23 25   GLUCOSE 139* 119* 115* 114* 312* 106* 112*  BUN 10 11 11 12 10 9 10   CREATININE 0.79 0.94 1.47* 1.61* 1.67* 1.57* 1.60*  CALCIUM 7.7* 7.7* 8.3* 8.7 7.8* 8.3* 8.6  MG 2.2 2.2 2.0 2.2  --  2.1  --   PHOS 3.1 2.6 3.1 3.8  --   --   --    Liver Function Tests:  Recent Labs Lab 09/09/13 0440 09/10/13 0700 09/11/13 0430 09/12/13 0300 09/13/13 0240  AST 48* 63* 86* 74* 75*  ALT 19 21 29 28 31   ALKPHOS 82 91 103 87 104  BILITOT 0.5 0.5 0.5 0.3 0.3  PROT 5.8* 6.8 6.7 5.6* 6.4  ALBUMIN 1.6* 2.0* 1.9* 1.7* 2.0*   No results found for this basename: LIPASE, AMYLASE,  in the last 168 hours No  results found for this basename: AMMONIA,  in the last 168 hours CBC:  Recent Labs Lab 09/09/13 0440 09/10/13 0545 09/11/13 0430 09/12/13 0300 09/12/13 1435 09/13/13 0240 09/14/13 0435  WBC 16.9* 15.1* 14.3* 13.6* 13.4* 14.5* 11.7*  NEUTROABS 11.9* 10.3* 9.6* 9.3*  --  9.4*  --   HGB 8.5* 9.1* 9.0* 7.7* 8.6* 8.0* 8.1*  HCT 25.0* 26.7* 26.8* 22.6* 25.7* 24.0* 24.0*  MCV 98.4 96.0 100.0 99.1 100.0 97.2 97.2  PLT 391 461* 484* 427* 513* 534* 539*  Cardiac Enzymes:  Recent Labs Lab 09/07/13 1800  TROPONINI <0.30   BNP (last 3 results)  Recent Labs  09/06/13 2109  PROBNP 982.1*   CBG: No results found for this basename: GLUCAP,  in the last 168 hours  Recent Results (from the past 240 hour(s))  CULTURE, BLOOD (ROUTINE X 2)     Status: None   Collection Time    09/06/13  8:59 PM      Result Value Ref Range Status   Specimen Description BLOOD HAND RIGHT   Final   Special Requests BOTTLES DRAWN AEROBIC AND ANAEROBIC 5CC   Final   Culture  Setup Time     Final   Value: 09/07/2013 03:14     Performed at [facility]   Culture     Final   Value: NO GROWTH 5 DAYS     Performed at [facility]   Report Status 09/13/2013 FINAL   Final  CULTURE, BLOOD (ROUTINE X 2)     Status: None   Collection Time    09/06/13  9:23 PM      Result Value Ref Range Status   Specimen Description BLOOD FOREARM RIGHT   Final   Special Requests BOTTLES DRAWN AEROBIC ONLY 2CC   Final   Culture  Setup Time     Final   Value: 09/07/2013 03:15     Performed at [facility]   Culture     Final   Value: NO GROWTH 5 DAYS     Performed at [facility]   Report Status 09/13/2013 FINAL   Final  URINE CULTURE     Status: None   Collection Time    09/07/13  1:21 AM      Result Value Ref Range Status   Specimen Description URINE, CLEAN CATCH   Final   Special Requests NONE   Final   Culture  Setup Time     Final   Value: 09/07/2013 04:28     Performed at [facility]     Final   Value: NO GROWTH     Performed at [facility]   Culture     Final   Value: NO GROWTH     Performed at [facility]   Report Status 09/08/2013 FINAL   Final  MRSA PCR SCREENING     Status: None   Collection Time    09/07/13  2:00 AM      Result Value Ref Range Status   MRSA by PCR NEGATIVE  NEGATIVE Final   Comment:            The GeneXpert MRSA Assay (FDA     approved for NASAL specimens     only), is one component of a     comprehensive MRSA colonization     surveillance program. It is not     intended to diagnose MRSA     infection nor to guide or     monitor treatment for     MRSA infections.  BODY FLUID CULTURE     Status: None   Collection Time    09/07/13 10:29 AM      Result Value Ref Range Status   Specimen Description FLUID RIGHT PLEURAL   Final   Special Requests POF VANCOMYCIN   Final   Gram Stain     Final   Value: NO WBC SEEN     ABUNDANT GRAM VARIABLE ROD  RARE GRAM POSITIVE COCCI     IN CHAINS Gram Stain Report Called to,Read Back By and Verified With: Gram Stain Report Called to,Read Back By and Verified With: [place] @ 4098 ON 09/10/2013 BY WEBSP     Performed at [facility]   Culture     Final   Value: RARE STREPTOCOCCUS GROUP F     Note: CRITICAL RESULT CALLED TO, READ BACK BY AND VERIFIED WITH: CHRISTINE     Performed at [facility]   Report Status 09/10/2013 FINAL   Final  ANAEROBIC CULTURE     Status: None   Collection Time    09/07/13 10:29 AM      Result Value Ref Range Status   Specimen Description FLUID RIGHT PLEURAL   Final   Special Requests POF VANCOMYCIN   Final   Gram Stain     Final   Value: NO WBC SEEN     ABUNDANT GRAM VARIABLE ROD     RARE GRAM POSITIVE COCCI     IN CHAINS     Performed at [facility]   Culture     Final   Value: NO ANAEROBES ISOLATED     Performed at [facility]   Report Status 09/13/2013 FINAL   Final  TISSUE  CULTURE     Status: None   Collection Time    09/07/13 10:30 AM      Result Value Ref Range Status   Specimen Description TISSUE RIGHT PLEURAL   Final   Special Requests POF ON VANCOMYCIN RIGHT PLEURAL PEEL   Final   Gram Stain     Final   Value: RARE WBC PRESENT, PREDOMINANTLY MONONUCLEAR     NO SQUAMOUS EPITHELIAL CELLS SEEN     ABUNDANT GRAM POSITIVE RODS     IN PAIRS ABUNDANT GRAM NEGATIVE RODS     FEW GRAM POSITIVE RODS   Culture     Final   Value: NO GROWTH 3 DAYS     Performed at [facility]   Report Status 09/10/2013 FINAL   Final  ANAEROBIC CULTURE     Status: None   Collection Time    09/07/13 10:30 AM      Result Value Ref Range Status   Specimen Description TISSUE RIGHT PLEURAL   Final   Special Requests POF ON VANCOMYCIN RIGHT PLEURAL PEEL   Final   Gram Stain     Final   Value: RARE WBC PRESENT, PREDOMINANTLY MONONUCLEAR     NO SQUAMOUS EPITHELIAL CELLS SEEN     ABUNDANT GRAM POSITIVE RODS     IN PAIRS ABUNDANT GRAM NEGATIVE RODS     FEW GRAM POSITIVE RODS   Culture     Final   Value: NO ANAEROBES ISOLATED; CULTURE IN PROGRESS FOR 5 DAYS     Performed at [facility]   Report Status PENDING   Incomplete  TISSUE CULTURE     Status: None   Collection Time    09/07/13 11:10 AM      Result Value Ref Range Status   Specimen Description TISSUE RIGHT PLEURAL   Final   Special Requests POF VANCOMYCIN RIGHT VISCERAL PLEURAL PEEL   Final   Gram Stain     Final   Value: NO WBC SEEN     NO SQUAMOUS EPITHELIAL CELLS SEEN     NO ORGANISMS SEEN     Performed at [facility]     Final  Value: NO GROWTH 3 DAYS     Performed at [facility]   Report Status 09/10/2013 FINAL   Final  ANAEROBIC CULTURE     Status: None   Collection Time    09/07/13 11:10 AM      Result Value Ref Range Status   Specimen Description TISSUE RIGHT PLEURAL   Final   Special Requests POF VANCOMYCIN RIGHT VISERSL PLEURAL PEEL   Final   Gram Stain      Final   Value: NO WBC SEEN     NO SQUAMOUS EPITHELIAL CELLS SEEN     NO ORGANISMS SEEN     Performed at [facility]   Culture     Final   Value: NO ANAEROBES ISOLATED     Performed at [facility]   Report Status 09/12/2013 FINAL   Final     Studies: No results found.  Scheduled Meds: . feeding supplement (RESOURCE BREEZE)  1 Container Oral TID BM  . pantoprazole  40 mg Oral BID  . piperacillin-tazobactam (ZOSYN)  IV  3.375 g Intravenous 3 times per day  . senna-docusate  2 tablet Oral QHS  . sodium chloride  3 mL Intravenous Q12H   Continuous Infusions: . sodium chloride 10 mL/hr at 09/13/13 1417    Active Problems:   Acute respiratory failure   Lung abscess   Empyema   Sepsis   Cardiomyopathy, dilated   Hypertensive heart disease    Time spent: 30 min    Lyfe Monger, [facility] Pager [phone]. If 7PM-7AM, please contact night-coverage at [email or web address], password [facility] 09/14/2013, 10:34 AM  LOS: 8 days

## 2013-09-15 ENCOUNTER — Inpatient Hospital Stay (HOSPITAL_COMMUNITY): Payer: Medicare Other

## 2013-09-15 LAB — CBC
HEMATOCRIT: 23.8 % — AB (ref 39.0–52.0)
HEMOGLOBIN: 8.1 g/dL — AB (ref 13.0–17.0)
MCH: 33.3 pg (ref 26.0–34.0)
MCHC: 34 g/dL (ref 30.0–36.0)
MCV: 97.9 fL (ref 78.0–100.0)
PLATELETS: 580 10*3/uL — AB (ref 150–400)
RBC: 2.43 MIL/uL — AB (ref 4.22–5.81)
RDW: 15 % (ref 11.5–15.5)
WBC: 12.6 10*3/uL — ABNORMAL HIGH (ref 4.0–10.5)

## 2013-09-15 LAB — BASIC METABOLIC PANEL
Anion gap: 11 (ref 5–15)
BUN: 11 mg/dL (ref 6–23)
CO2: 26 meq/L (ref 19–32)
Calcium: 8.6 mg/dL (ref 8.4–10.5)
Chloride: 107 mEq/L (ref 96–112)
Creatinine, Ser: 1.64 mg/dL — ABNORMAL HIGH (ref 0.50–1.35)
GFR calc Af Amer: 54 mL/min — ABNORMAL LOW (ref >90)
GFR, EST NON AFRICAN AMERICAN: 47 mL/min — AB (ref >90)
Glucose, Bld: 109 mg/dL — ABNORMAL HIGH (ref 70–99)
Potassium: 4.3 mEq/L (ref 3.7–5.3)
Sodium: 144 mEq/L (ref 137–147)

## 2013-09-15 NOTE — Progress Notes (Addendum)
      [address]       [place] [address]             [phone]      8 Days Post-Op Procedure(s) (LRB): VIDEO ASSISTED THORACOSCOPY (VATS)/ DRAINAGE OF EMPYEMA/ DECORTICATION (Right)  Subjective:  Mr. Clubb has no new complaints.  He states he waiting to have an ultrasound of his liver done.    Objective: Vital signs in last 24 hours: Temp:  [97.3 F (36.3 C)-99.4 F (37.4 C)] 99.4 F (37.4 C) (08/19 0551) Pulse Rate:  [86-98] 86 (08/19 0551) Cardiac Rhythm:  [-]  Resp:  [18-19] 18 (08/19 0551) BP: (142-146)/(80-85) 146/85 mmHg (08/19 0551) SpO2:  [96 %-99 %] 96 % (08/19 0551)  Intake/Output from previous day: 08/18 0701 - 08/19 0700 In: 73 [I.V.:73] Out: 1975 [Urine:1975] Intake/Output this shift: Total I/O In: 120 [P.O.:120] Out: 775 [Urine:775]  General appearance: alert, cooperative and no distress Heart: regular rate and rhythm Lungs: diminished breath sounds on right Wound: clean and dry  Lab Results:  Recent Labs  09/14/13 0435 09/15/13 0507  WBC 11.7* 12.6*  HGB 8.1* 8.1*  HCT 24.0* 23.8*  PLT 539* 580*   BMET:  Recent Labs  09/14/13 0435 09/15/13 0507  NA 142 144  K 4.3 4.3  CL 107 107  CO2 25 26  GLUCOSE 112* 109*  BUN 10 11  CREATININE 1.60* 1.64*  CALCIUM 8.6 8.6    PT/INR: No results found for this basename: LABPROT, INR,  in the last 72 hours ABG    Component Value Date/Time   PHART 7.421 09/08/2013 0502   HCO3 23.3 09/08/2013 0502   TCO2 24.4 09/08/2013 0502   ACIDBASEDEF 0.6 09/08/2013 0502   O2SAT 94.2 09/08/2013 0502   CBG (last 3)  No results found for this basename: GLUCAP,  in the last 72 hours  Assessment/Plan: S/P Procedure(s) (LRB): VIDEO ASSISTED THORACOSCOPY (VATS)/ DRAINAGE OF EMPYEMA/ DECORTICATION (Right)  1. Empyema- chest tubes out 2. ID- On Zosyn Day 9, slight fever today, no leukocytosis, OR fluid cultures have come back positive for Streptococcus species Group F, all other cutlures  remain negative 3. Dispo- plan per medicine  LOS: 9 days    BARRETT, ERIN 09/15/2013  Patient seen and examned, agree with above. Day 9 of Zosyn would complete 10 days of IV antibiotics and then could change to PO antibiotic, such as Augmentin for another 10 days at discharge RUQ U/S showed some thickening of the GB- I suspect his pain is primarily incisional in nature Creatinine remains mildly elevated

## 2013-09-15 NOTE — Progress Notes (Signed)
[facility] PROGRESS NOTE  Frank Solomon PYP:[id number] DOB: 1960-11-27 DOA: 09/06/2013 PCP: No primary provider on file.  Assessment/Plan: Active Problems:   Acute respiratory failure   Lung abscess   Empyema   Sepsis   Cardiomyopathy, dilated   Hypertensive heart disease   Admit HPI / Brief Narrative:  53 yo M w/ Hx Tobacco abuse, CHF, TIA who presented with acute resp failure. Pt reported sudden onset of CP and SOB over 2-3 days. Denied any trauma. On presentation to the ER. tmax 99.9, HR 100s-120s, RR- 20s-40, SBP 100s-130s. Satting > 95% on 3L. WBC 33.2, hgb 11, K 3.3, Cr 0.92. Trop and lactate WNL. CT angio of the chest noted 11x12 cm RLL air fluid level w/ presumed necrotic tissue in addition to R loculated empyema with infectious ground glass opacities and reactive mediastinal LAD. + mild mediastinal L shift. CTS was consulted and patient underwent VATS on 8/11 and was extubated on 8/12. He required vasopressors for 1-2 days, but currently stable on RA and resuming blood pressure medications. On monotherapy zosyn. Home once cleared by surgery. If planning for long course of IV antibiotics, will need PICC line, otherwise, please d/c CVC ASAP. Also had some melena but GI wants to have outpatient follow up due to pulmonary issues. Hemoglobin stable.   Assessment/Plan  Septic shock and acute hypoxic respiratory failure due to R empyema, vasopressors off and on RA, improving.  - S/P right VATS - post-op care per TCTS  - abx selection and tx course per [facility]CTS, zosyn day 9 - Body fluid cx growing Strep group F  - Tissue culture with abundant Gram pos rods in pairs and abundant gram-negative rods; speciation and susceptibilities pending   RUQ pain, likely due to fluid above dome of liver , pulmonary process - Mild transaminitis appears about stable, bilirubin stable  - [place] 8/14 with sludge, no evidence of cholecystitis, just fluid above dome of liver which is probably reactive from lung  issues.  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 Collene Mares, GI, recommended we continue to monitor the pt and consider endoscopic eval in f/u as outpt due to close proximity to his recent VATS  - Hgb approximately stable around 8.1mg /dl this morning   Hypokalemia, resolved this AM  -Mg normal    Leukocytosis and thrombocytosis due to empyema, resolving.   AKI, likey due to ATN from septic shock and antibiotic exposure  - Creatinine stable  - Minimize nephrotoxins  - Renally dose medications    Diet: Healthy heart  Access: Right IJ CVC >> if prolonged IV antibiotics, consider PICC line, otherwise PIV  IVF: off  Proph: SCDs    Code Status: full code  Family Communication: patient alone  Disposition Plan: continue med-surg , PT OT eval   Consultants:  Dr. Modesto Charon (cardiothoracic surgery)  Dr.Vishal Mungal (PCCM)  GI - Dr. Collene Mares - via phone  Procedure/Significant Events:  8/10 CXR >>> b/l opacities, 3x4cm oval structure in RLL 8/10 Chest CTA >>> 11 x 38mm  right lung base probable abscess with air fluid level, large right loculated pleural effusion vs empyema.  8/11VIDEO ASSISTED THORACOSCOPY (VATS)/ DRAINAGE OF EMPYEMA/ DECORTICATION (Right)  8/11 echocardiogram;- Left ventricle: mild LVH. LVEF= 35% to 40%. Diffuse hypokinesis. grade 1 diastolic dysfunction 6/56 extubated    Antibiotics:  Zosyn 8/10 >>   HPI/Subjective:  Poor appetite but eating. Denies nausea, vomiting. Has pain in RUQ which has not improved since yesterday Objective: Filed  Vitals:   09/14/13 0508 09/14/13 1419 09/14/13 2158 09/15/13 0551  BP: 147/78 146/80 142/81 146/85  Pulse: 89 98 94 86  Temp: 98.6 F (37 C) 97.3 F (36.3 C) 98.6 F (37 C) 99.4 F (37.4 C)  TempSrc: Oral Oral Oral   Resp: 20 19 18 18   Height:      Weight:      SpO2: 98% 99% 97% 96%    Intake/Output Summary (Last 24 hours) at 09/15/13 1249 Last data filed at 09/15/13 0910  Gross per 24 hour  Intake    193 ml  Output   1925 ml  Net  -1732 ml    Exam:  General: alert & oriented x 3 In NAD  Cardiovascular: RRR, nl S1 s2  Respiratory: Decreased breath sounds at the bases, scattered rhonchi, no crackles  Abdomen: soft +BS NT/ND, no masses palpable  Extremities: No cyanosis and no edema      Data Reviewed: Basic Metabolic Panel:  Recent Labs Lab 09/09/13 0440 09/10/13 0700 09/11/13 0430 09/12/13 0300 09/13/13 0240 09/14/13 0435 09/15/13 0507  NA 138 141 141 142 139 142 144  K 3.6* 4.3 4.5 5.0 3.6* 4.3 4.3  CL 106 106 107 111 104 107 107  CO2 21 24 21 23 23 25 26   GLUCOSE 119* 115* 114* 312* 106* 112* 109*  BUN 11 11 12 10 9 10 11   CREATININE 0.94 1.47* 1.61* 1.67* 1.57* 1.60* 1.64*  CALCIUM 7.7* 8.3* 8.7 7.8* 8.3* 8.6 8.6  MG 2.2 2.0 2.2  --  2.1  --   --   PHOS 2.6 3.1 3.8  --   --   --   --     Liver Function Tests:  Recent Labs Lab 09/09/13 0440 09/10/13 0700 09/11/13 0430 09/12/13 0300 09/13/13 0240  AST 48* 63* 86* 74* 75*  ALT 19 21 29 28 31   ALKPHOS 82 91 103 87 104  BILITOT 0.5 0.5 0.5 0.3 0.3  PROT 5.8* 6.8 6.7 5.6* 6.4  ALBUMIN 1.6* 2.0* 1.9* 1.7* 2.0*   No results found for this basename: LIPASE, AMYLASE,  in the last 168 hours No results found for this basename: AMMONIA,  in the last 168 hours  CBC:  Recent Labs Lab 09/09/13 0440 09/10/13 0545 09/11/13 0430 09/12/13 0300 09/12/13 1435 09/13/13 0240 09/14/13 0435 09/15/13 0507  WBC 16.9* 15.1* 14.3* 13.6* 13.4* 14.5* 11.7* 12.6*  NEUTROABS 11.9* 10.3* 9.6* 9.3*  --  9.4*   --   --   HGB 8.5* 9.1* 9.0* 7.7* 8.6* 8.0* 8.1* 8.1*  HCT 25.0* 26.7* 26.8* 22.6* 25.7* 24.0* 24.0* 23.8*  MCV 98.4 96.0 100.0 99.1 100.0 97.2 97.2 97.9  PLT 391 461* 484* 427* 513* 534* 539* 580*    Cardiac Enzymes: No results found for this basename: CKTOTAL, CKMB, CKMBINDEX, TROPONINI,  in the last 168 hours BNP (last 3 results)  Recent Labs  09/06/13 2109  PROBNP 982.1*     CBG: No results found for this basename: GLUCAP,  in the last 168 hours  Recent  Results (from the past 240 hour(s))  CULTURE, BLOOD (ROUTINE X 2)     Status: None   Collection Time    09/06/13  8:59 PM      Result Value Ref Range Status   Specimen Description BLOOD HAND RIGHT   Final   Special Requests BOTTLES DRAWN AEROBIC AND ANAEROBIC 5CC   Final   Culture  Setup Time     Final   Value: 09/07/2013 03:14     Performed at [facility]   Culture     Final   Value: NO GROWTH 5 DAYS     Performed at [facility]   Report Status 09/13/2013 FINAL   Final  CULTURE, BLOOD (ROUTINE X 2)     Status: None   Collection Time    09/06/13  9:23 PM      Result Value Ref Range Status   Specimen Description BLOOD FOREARM RIGHT   Final   Special Requests BOTTLES DRAWN AEROBIC ONLY 2CC   Final   Culture  Setup Time     Final   Value: 09/07/2013 03:15     Performed at [facility]   Culture     Final   Value: NO GROWTH 5 DAYS     Performed at [facility]   Report Status 09/13/2013 FINAL   Final  URINE CULTURE     Status: None   Collection Time    09/07/13  1:21 AM      Result Value Ref Range Status   Specimen Description URINE, CLEAN CATCH   Final   Special Requests NONE   Final   Culture  Setup Time     Final   Value: 09/07/2013 04:28     Performed at [facility]     Final   Value: NO GROWTH     Performed at [facility]   Culture     Final   Value: NO GROWTH     Performed at [facility]   Report Status 09/08/2013 FINAL    Final  MRSA PCR SCREENING     Status: None   Collection Time    09/07/13  2:00 AM      Result Value Ref Range Status   MRSA by PCR NEGATIVE  NEGATIVE Final   Comment:            The GeneXpert MRSA Assay (FDA     approved for NASAL specimens     only), is one component of a     comprehensive MRSA colonization     surveillance program. It is not     intended to diagnose MRSA     infection nor to guide or     monitor treatment for     MRSA infections.  BODY FLUID CULTURE     Status: None   Collection Time    09/07/13 10:29 AM      Result Value Ref Range Status   Specimen Description FLUID RIGHT PLEURAL   Final   Special Requests POF VANCOMYCIN   Final   Gram Stain     Final   Value: NO WBC SEEN     ABUNDANT GRAM VARIABLE ROD     RARE GRAM POSITIVE COCCI     IN CHAINS Gram Stain Report Called to,Read Back By and Verified With: Gram Stain Report Called to,Read Back By and Verified With: CHRISTINE @ [id number] ON 09/10/2013 BY WEBSP  Performed at [facility]     Final   Value: RARE STREPTOCOCCUS GROUP F     Note: CRITICAL RESULT CALLED TO, READ BACK BY AND VERIFIED WITH: CHRISTINE     Performed at [facility]   Report Status 09/10/2013 FINAL   Final  ANAEROBIC CULTURE     Status: None   Collection Time    09/07/13 10:29 AM      Result Value Ref Range Status   Specimen Description FLUID RIGHT PLEURAL   Final   Special Requests POF VANCOMYCIN   Final   Gram Stain     Final   Value: NO WBC SEEN     ABUNDANT GRAM VARIABLE ROD     RARE GRAM POSITIVE COCCI     IN CHAINS     Performed at [facility]   Culture     Final   Value: NO ANAEROBES ISOLATED     Performed at [facility]   Report Status 09/13/2013 FINAL   Final  TISSUE CULTURE     Status: None   Collection Time    09/07/13 10:30 AM      Result Value Ref Range Status   Specimen Description TISSUE RIGHT PLEURAL   Final   Special Requests POF ON VANCOMYCIN RIGHT PLEURAL PEEL    Final   Gram Stain     Final   Value: RARE WBC PRESENT, PREDOMINANTLY MONONUCLEAR     NO SQUAMOUS EPITHELIAL CELLS SEEN     ABUNDANT GRAM POSITIVE RODS     IN PAIRS ABUNDANT GRAM NEGATIVE RODS     FEW GRAM POSITIVE RODS   Culture     Final   Value: NO GROWTH 3 DAYS     Performed at [facility]   Report Status 09/10/2013 FINAL   Final  ANAEROBIC CULTURE     Status: None   Collection Time    09/07/13 10:30 AM      Result Value Ref Range Status   Specimen Description TISSUE RIGHT PLEURAL   Final   Special Requests POF ON VANCOMYCIN RIGHT PLEURAL PEEL   Final   Gram Stain     Final   Value: RARE WBC PRESENT, PREDOMINANTLY MONONUCLEAR     NO SQUAMOUS EPITHELIAL CELLS SEEN     ABUNDANT GRAM POSITIVE RODS     IN PAIRS ABUNDANT GRAM NEGATIVE RODS     FEW GRAM POSITIVE RODS   Culture     Final   Value: BACTEROIDES UNIFORMIS     Note: BETA LACTAMASE NEGATIVE     Performed at [facility]   Report Status 09/14/2013 FINAL   Final  TISSUE CULTURE     Status: None   Collection Time    09/07/13 11:10 AM      Result Value Ref Range Status   Specimen Description TISSUE RIGHT PLEURAL   Final   Special Requests POF VANCOMYCIN RIGHT VISCERAL PLEURAL PEEL   Final   Gram Stain     Final   Value: NO WBC SEEN     NO SQUAMOUS EPITHELIAL CELLS SEEN     NO ORGANISMS SEEN     Performed at [facility]   Culture     Final   Value: NO GROWTH 3 DAYS     Performed at [facility]   Report Status 09/10/2013 FINAL   Final  ANAEROBIC CULTURE     Status: None   Collection Time  09/07/13 11:10 AM      Result Value Ref Range Status   Specimen Description TISSUE RIGHT PLEURAL   Final   Special Requests POF VANCOMYCIN RIGHT VISERSL PLEURAL PEEL   Final   Gram Stain     Final   Value: NO WBC SEEN     NO SQUAMOUS EPITHELIAL CELLS SEEN     NO ORGANISMS SEEN     Performed at [facility]   Culture     Final   Value: NO ANAEROBES ISOLATED     Performed at [facility]   Report Status 09/12/2013 FINAL   Final     Studies: Dg Chest 2 View  09/12/2013   CLINICAL DATA:  Status post decortication, fall profusion  EXAM: CHEST  2 VIEW  COMPARISON:  Chest radiograph 09/11/2013  FINDINGS: Right central venous line unchanged. Normal cardiac silhouette. There is a right pleural effusion which is partially loculated. There is right lower lobe opacities. No pneumothorax.  IMPRESSION: No change in the right pleural fluid and right basilar opacities. No pneumothorax.   Electronically Signed   By: Suzy Bouchard M.D.   On: 09/12/2013 07:46   Chest 2 View  09/07/2013   CLINICAL DATA:  Preoperative evaluation. Draining abscess. Lung infection.  EXAM: CHEST  2 VIEW  COMPARISON:  Chest radiograph 09/06/2013; chest CT 09/06/2013.  FINDINGS: Stable enlarged cardiac and mediastinal contours with leftward mediastinal shift. Re- demonstrated air-fluid level within the right lower hemithorax compatible with probable large loculated right pleural effusion/empyema. Underlying right lung base consolidative opacities. Small left pleural effusion.  IMPRESSION: Re- demonstrated large right pleural effusion with air-fluid level and underlying consolidative opacities. Opacities are concerning for infection. Large right pleural fluid collection may represent empyema. Gas may be secondary to infection or bronchopleural fistula.   Electronically Signed   By: Lovey Newcomer M.D.   On: 09/07/2013 09:00   Ct Angio Chest Pe W/cm &/or Wo Cm  09/06/2013   CLINICAL DATA:  Chest pain after lifting injury.  New onset cough.  EXAM: CT ANGIOGRAPHY CHEST WITH CONTRAST  TECHNIQUE: Multidetector CT imaging of the chest was performed using the standard protocol during bolus administration of intravenous contrast. Multiplanar CT image reconstructions and MIPs were obtained to evaluate the vascular anatomy.  CONTRAST:  36mL OMNIPAQUE IOHEXOL 350 MG/ML SOLN  COMPARISON:  Chest radiograph September 06, 2013   FINDINGS: Adequate pulmonary arterial contrast opacification. No pulmonary arterial filling defects to the level of the subsegmental branches.  At least 11 x 12 cm right lower lobe air-fluid level, with thick rind of low-density presumed necrotic tissue, appearing extrapleural with enhancing atelectasis anteriorly, surrounding ground-glass nodules and loculated component. Low-density component along the anterior pleural surface of the right upper lobe. Small left pleural effusion.  12 mm short axis pretracheal, 14 mm short axis subcarinal lymph node. Inflammatory changes of the cardiophrenic angle with probable lymphadenopathy. Tracheobronchial tree is patent and midline. No pneumothorax.  Right lung process mildly displaces the heart to the left. Pericardium is nonsuspicious. Thoracic aorta is normal in course and caliber with mild calcific atherosclerosis.  Included view of the abdomen is unremarkable. Soft tissues are nonsuspicious. No acute osseous process.  Review of the MIP images confirms the above findings.  IMPRESSION: No acute pulmonary embolism.  At least 11 x 12 mm right lung base probable abscess with air-fluid level (which can also be seen with bronchopleural fistula), in addition to large right loculated pleural effusion/empyema. Associated ground-glass opacities are  probably infectious. Mediastinal lymphadenopathy is likely reactive. Right lung process results in mild mediastinal shift to the left.  Small left pleural effusion.  Findings discussed with and reconfirmed by Dr.EMILY WEST on8/10/2015at11:09 pm.   Electronically Signed   By: Elon Alas   On: 09/06/2013 23:13   US Abdomen Complete  09/10/2013   CLINICAL DATA:  Right upper quadrant pain.  EXAM: ULTRASOUND ABDOMEN COMPLETE  COMPARISON:  None.  FINDINGS: Gallbladder:  There is a 1.8 cm stone in the lower gallbladder segment has well is dependent sludge. No wall thickening or pericholecystic fluid. No evidence of acute cholecystitis.   Common bile duct:  Diameter: 5 mm.  Liver:  Limited evaluation of the liver. Liver normal in overall size. Small amount of fluid is suggested over the dome of the liver. There is a somewhat coarsened liver echotexture. No liver mass or focal lesion is seen.  IVC:  No abnormality visualized.  Pancreas:  Not visualized.  Spleen:  Size and appearance within normal limits.  Right Kidney:  Length: 12 cm. Echogenicity within normal limits. No mass or hydronephrosis visualized.  Left Kidney:  Length: 12 cm. Echogenicity within normal limits. No mass or hydronephrosis visualized.  Abdominal aorta:  Not visualized.  Other findings:  None.  IMPRESSION: 1. No acute findings. 2. Cholelithiasis and gallbladder sludge.  No acute cholecystitis. 3. Suboptimal visualization of the liver. No liver mass or focal lesion. 4. Small amount of fluid is seen over the dome of the liver. 5. Pancreas and aorta not visualized.   Electronically Signed   By: Lajean Manes M.D.   On: 09/10/2013 18:15   Dg Chest Port 1 View  09/11/2013   CLINICAL DATA:  Chest tube removal  EXAM: PORTABLE CHEST - 1 VIEW  COMPARISON:  09/11/2013  FINDINGS: Large cardiac silhouette. No airspace disease and effusion at the right lung base unchanged. Interval removal right chest tube No pneumothorax.  IMPRESSION: No change in right lower pneumonia and parapneumonic effusion.  Removal of right chest tube.  No pneumothorax.   Electronically Signed   By: Suzy Bouchard M.D.   On: 09/11/2013 15:33   Dg Chest Port 1 View  09/11/2013   CLINICAL DATA:  Status post drainage of empyema with chest tube placement.  EXAM: PORTABLE CHEST - 1 VIEW  COMPARISON:  Multiple priors.  Also CT chest 09/06/2013.  FINDINGS: RIGHT basilar chest tube remains. Moderate RIGHT pleural fluid is stable. There is no pneumothorax. Central venous catheter tip superior vena cava unchanged. Stable enlarged cardiomediastinal silhouette and stable LEFT hemithorax.  IMPRESSION: Stable chest.    Electronically Signed   By: Rolla Flatten M.D.   On: 09/11/2013 08:32   Dg Chest Port 1 View  09/10/2013   CLINICAL DATA:  Follow-up far ongoing decortication  EXAM: PORTABLE CHEST - 1 VIEW  COMPARISON:  Portable chest x-ray of September 09, 2013  FINDINGS: The left lung is clear. On the right increasing atelectatic change is present. A small amount of pleural fluid persists. The medial upper chest tube and the medial lower chest tube remain. The more lateral upper chest tube is been removed. There is no mediastinal shift. The cardiopericardial silhouette is mildly enlarged but stable. The right internal jugular venous catheter tip lies in the region of the proximal SVC.  IMPRESSION: There has been slight interval increase in pleural fluid and atelectasis in the right lung since yesterday's study. There is no pneumothorax.   Electronically Signed   By: David  Martinique  On: 09/10/2013 08:06   Dg Chest Port 1 View  09/09/2013   CLINICAL DATA:  Status post VATS  EXAM: PORTABLE CHEST - 1 VIEW  COMPARISON:  09/08/2013  FINDINGS: Cardiac shadow is stable in enlarged. A right jugular line and three right-sided thoracostomy catheters are again seen and stable. No pneumothorax is noted. Left lung remains clear. Right basilar atelectasis/effusion is again noted.  IMPRESSION: Stable changes on the right.  No new acute abnormality is noted.   Electronically Signed   By: Inez Catalina M.D.   On: 09/09/2013 08:07   Dg Chest Port 1 View  09/08/2013   CLINICAL DATA:  Video assisted thoracoscopy for drainage of empyema.  EXAM: PORTABLE CHEST - 1 VIEW  COMPARISON:  09/07/2013  FINDINGS: Endotracheal tube has its tip 3 cm above the carina. Nasogastric tube enters the abdomen. 3 chest tubes are in place on the right. There is no pneumothorax. Right internal jugular central line has its tip in the SVC above the right atrium. There is worsening volume loss in both lower lobes.  IMPRESSION: Three chest tubes in place on the right. No  pneumothorax. Worsening volume loss in both lower lobes.   Electronically Signed   By: Nelson Chimes M.D.   On: 09/08/2013 08:14   Dg Chest Port 1 View  09/07/2013   CLINICAL DATA:  Endotracheal and chest tube placement. Video-assisted thoracoscopy with drainage of empyema and decortication, right chest. Postop day 0  EXAM: PORTABLE CHEST - 1 VIEW  COMPARISON:  09/07/2013  FINDINGS: Endotracheal tube tip: 2.7 cm above the carina. Right IJ line tip: SVC.  Multiple right-sided chest tubes are present. Small right basilar pneumothorax peripherally. There is fluid in the minor fissure. Reduced atelectasis at the right lung base, with some underlying interstitial and patchy airspace opacity in the right lung.  Suspected mild atelectasis in the left upper lobe. Mildly enlarged cardiopericardial silhouette. Significantly reduced right pleural effusion. Mildly blunted left lateral costophrenic angle.  IMPRESSION: 1. Interval right empyema drainage and decortication, with small right peripheral basilar pneumothorax, significant decrease in right pleural effusion and right basilar atelectasis, and multiple chest tubes in place. There is some patchy interstitial and airspace opacity in the right lung. 2. Mild atelectasis in the left upper lobe. 3. Mildly enlarged cardiopericardial silhouette. 4. Tubes and lines appear satisfactorily positioned.   Electronically Signed   By: Sherryl Barters M.D.   On: 09/07/2013 14:13   Dg Chest Port 1 View  09/06/2013   CLINICAL DATA:  53 year old male with shortness of breath and respiratory distress  EXAM: PORTABLE CHEST - 1 VIEW  COMPARISON:  01/04/1998 chest radiograph  FINDINGS: Pulmonary vascular congestion is noted.  Opacities overlying both lower lungs, right greater than left, probably represents effusion and atelectasis/airspace disease.  A 3 x 4 cm oval structure overlying the right lower lung could represent a focal mass.  No acute bony abnormalities are noted.  There is no  evidence of pneumothorax.  IMPRESSION: Bilateral lower lung opacities, right greater than left, likely representing pleural effusions and atelectasis/airspace disease.  3 x 4 cm oval structure overlying the right lower lung could represent focal mass. Radiographic follow-up recommended.  Pulmonary vascular congestion.   Electronically Signed   By: Hassan Rowan M.D.   On: 09/06/2013 20:48    Scheduled Meds: . carvedilol  12.5 mg Oral BID WC  . feeding supplement (RESOURCE BREEZE)  1 Container Oral TID BM  . furosemide  20 mg Oral Daily  .  pantoprazole  40 mg Oral BID  . piperacillin-tazobactam (ZOSYN)  IV  3.375 g Intravenous 3 times per day  . senna-docusate  2 tablet Oral QHS  . sodium chloride  3 mL Intravenous Q12H   Continuous Infusions: . sodium chloride 10 mL/hr at 09/13/13 1417    Active Problems:   Acute respiratory failure   Lung abscess   Empyema   Sepsis   Cardiomyopathy, dilated   Hypertensive heart disease    Time spent: 40 minutes   [facility] Pager [phone]. If 7PM-7AM, please contact night-coverage at [email or web address], password [facility] 09/15/2013, 12:49 PM  LOS: 9 days

## 2013-09-15 NOTE — Progress Notes (Signed)
Physical Therapy Treatment Patient Details Name: Frank Solomon MRN: [id number] DOB: 1960/05/18 Today's Date: 09/15/2013    History of Present Illness Pt adm with rt empyema and underwent rt VATS and decortication on 09/07/13. PMH-CHF, TIA    PT Comments    Pt progressing well with mobility. Pt at at level of mobility appropriate for nursing to ambulate or pt to ambulate with family. Encouraged OOB and ambulating daily, pt verbalized understanding. All acute PT goals met and appropriate for D/C from mobility standpoint. Thanks. Please re-order if any significant medical changes occur that affect patient's functional mobility.   Follow Up Recommendations  No PT follow up     Equipment Recommendations  None recommended by PT    Recommendations for Other Services       Precautions / Restrictions Precautions Precautions: None Restrictions Weight Bearing Restrictions: No    Mobility  Bed Mobility Overal bed mobility: Modified Independent Bed Mobility: Rolling;Sidelying to Sit Rolling: Modified independent (Device/Increase time) Sidelying to sit: Modified independent (Device/Increase time)       General bed mobility comments: min cues for log rolling technique to reduce stress on Rt UQ  Transfers Overall transfer level: Modified independent               General transfer comment: incr time due to pain; no physical (A) needed   Ambulation/Gait Ambulation/Gait assistance: Modified independent (Device/Increase time) Ambulation Distance (Feet): 400 Feet Assistive device: None Gait Pattern/deviations: Step-through pattern;Decreased stride length Gait velocity: decr due to pain   General Gait Details: pt able to ambulate with RW this session; guarded gt speed due to pain; no LOB noted when scanning hallway or conversing    Stairs Stairs: Yes Stairs assistance: Supervision Stair Management: Two rails;Step to pattern;Forwards Number of Stairs: 2 General stair comments:  min cues for sequencing; no difficulty for pt to perform or LOB  Wheelchair Mobility    Modified Rankin (Stroke Patients Only)       Balance Overall balance assessment: Modified Independent;No apparent balance deficits (not formally assessed)                                  Cognition Arousal/Alertness: Awake/alert Behavior During Therapy: WFL for tasks assessed/performed Overall Cognitive Status: Within Functional Limits for tasks assessed                      Exercises      General Comments        Pertinent Vitals/Pain Pain Assessment: 0-10 Pain Score: 2  Pain Location: Rt UQ Pain Descriptors / Indicators:  ('pulling / stiff") Pain Intervention(s): Monitored during session;Premedicated before session;Repositioned    Home Living                      Prior Function            PT Goals (current goals can now be found in the care plan section) Acute Rehab PT Goals Patient Stated Goal: to go home tomorrow  PT Goal Formulation: With patient Time For Goal Achievement: 09/16/13 Potential to Achieve Goals: Good Progress towards PT goals: Goals met/education completed, patient discharged from PT    Frequency       PT Plan Current plan remains appropriate    Co-evaluation             End of Session Equipment Utilized During Treatment: Gait belt Activity Tolerance: Patient  tolerated treatment well Patient left: in chair;with call bell/phone within reach     Time: 0956-1011 PT Time Calculation (min): 15 min  Charges:  $Gait Training: 8-22 mins                    G Codes:      Elie Confer [place], [place] 09/15/2013, 4:49 PM

## 2013-09-15 NOTE — Progress Notes (Addendum)
NUTRITION FOLLOW UP  Intervention:   D/c Resource Breeze, due to poor acceptance and improved PO intake  Nutrition Dx:   Increased nutrient needs related to post-op healing as evidenced by estimated nutrition needs; improving  Goal:   Pt to meet >/= 90% of their estimated nutrition needs; goal met  Monitor:   PO & supplemental intake, weight, labs, I/O's  Assessment:   53 y.o. year old male with PMH of tobacco abuse, CHF, hx/o TIA presenting with acute resp failure. CT angio of the chest shows at least 11x12 cm RLL air fluid level of presumed necrotic tissue in addition to R loculated empyema with infectious ground glass opacities and reactive mediastinal LAD. + mild mediastinal L shift.  Patient s/p procedure 8/11:  VIDEO ASSISTED THORACOSCOPY (VATS)/ DRAINAGE OF EMPYEMA/ DECORTICATION (Right)  Noted 18# wt gain x 7 days, likely due to edema. Pt is currently on Lasix.  Per nursing, pt with poor appetite, but eating well. Documented meal intake 60-100%. Diet has been advanced to Heart Healthy.  Pt remains on Resource Breeze TID. Noted refusals about 50% of the time being offered. Will d/c due to poor acceptance and improved PO intake.  Labs reviewed. Mg and Phos WNL. K: 3.6. Creat: 1.57. Glucose: 106.   Height: Ht Readings from Last 1 Encounters:  09/07/13 6' (1.829 m)    Weight Status:   Wt Readings from Last 1 Encounters:  09/11/13 232 lb 12.9 oz (105.6 kg)    Re-estimated needs:  Kcal: 2100-2300 Protein: 117-127 grams Fluid: 2.1-2.3 L  Skin: closed rt chest incision  Diet Order: Cardiac   Intake/Output Summary (Last 24 hours) at 09/15/13 1212 Last data filed at 09/15/13 0910  Gross per 24 hour  Intake    193 ml  Output   2225 ml  Net  -2032 ml    Last BM: 09/15/13   Labs:   Recent Labs Lab 09/09/13 0440 09/10/13 0700 09/11/13 0430  09/13/13 0240 09/14/13 0435 09/15/13 0507  NA 138 141 141  < > 139 142 144  K 3.6* 4.3 4.5  < > 3.6* 4.3 4.3  CL 106  106 107  < > 104 107 107  CO2 _0 < > _1 BUN _2 < > _3 CREATININE 0.94 1.47* 1.61*  < > 1.57* 1.60* 1.64*  CALCIUM 7.7* 8.3* 8.7  < > 8.3* 8.6 8.6  MG 2.2 2.0 2.2  --  2.1  --   --   PHOS 2.6 3.1 3.8  --   --   --   --   GLUCOSE 119* 115* 114*  < > 106* 112* 109*  < > = values in this interval not displayed.  CBG (last 3)  No results found for this basename: GLUCAP,  in the last 72 hours  Scheduled Meds: . carvedilol  12.5 mg Oral BID WC  . feeding supplement (RESOURCE BREEZE)  1 Container Oral TID BM  . furosemide  20 mg Oral Daily  . pantoprazole  40 mg Oral BID  . piperacillin-tazobactam (ZOSYN)  IV  3.375 g Intravenous 3 times per day  . senna-docusate  2 tablet Oral QHS  . sodium chloride  3 mL Intravenous Q12H    Continuous Infusions: . sodium chloride 10 mL/hr at 09/13/13 1417    Tu Bayle A. Jimmye Norman, RD, LDN Pager: [phone] After hours Pager: [phone]

## 2013-09-16 ENCOUNTER — Inpatient Hospital Stay (HOSPITAL_COMMUNITY): Payer: Medicare Other

## 2013-09-16 LAB — COMPREHENSIVE METABOLIC PANEL
ALT: 34 U/L (ref 0–53)
ANION GAP: 11 (ref 5–15)
AST: 72 U/L — ABNORMAL HIGH (ref 0–37)
Albumin: 2.1 g/dL — ABNORMAL LOW (ref 3.5–5.2)
Alkaline Phosphatase: 95 U/L (ref 39–117)
BUN: 11 mg/dL (ref 6–23)
CO2: 27 mEq/L (ref 19–32)
Calcium: 8.7 mg/dL (ref 8.4–10.5)
Chloride: 106 mEq/L (ref 96–112)
Creatinine, Ser: 1.64 mg/dL — ABNORMAL HIGH (ref 0.50–1.35)
GFR calc non Af Amer: 47 mL/min — ABNORMAL LOW (ref >90)
GFR, EST AFRICAN AMERICAN: 54 mL/min — AB (ref >90)
GLUCOSE: 100 mg/dL — AB (ref 70–99)
Potassium: 4 mEq/L (ref 3.7–5.3)
Sodium: 144 mEq/L (ref 137–147)
TOTAL PROTEIN: 6.5 g/dL (ref 6.0–8.3)
Total Bilirubin: 0.3 mg/dL (ref 0.3–1.2)

## 2013-09-16 LAB — TYPE AND SCREEN
ABO/RH(D): O POS
Antibody Screen: NEGATIVE
Unit division: 0

## 2013-09-16 LAB — CBC
HEMATOCRIT: 23.3 % — AB (ref 39.0–52.0)
Hemoglobin: 7.9 g/dL — ABNORMAL LOW (ref 13.0–17.0)
MCH: 33.3 pg (ref 26.0–34.0)
MCHC: 33.9 g/dL (ref 30.0–36.0)
MCV: 98.3 fL (ref 78.0–100.0)
Platelets: 569 10*3/uL — ABNORMAL HIGH (ref 150–400)
RBC: 2.37 MIL/uL — ABNORMAL LOW (ref 4.22–5.81)
RDW: 15.2 % (ref 11.5–15.5)
WBC: 9.7 10*3/uL (ref 4.0–10.5)

## 2013-09-16 MED ORDER — AMOXICILLIN-POT CLAVULANATE 875-125 MG PO TABS
1.0000 | ORAL_TABLET | Freq: Two times a day (BID) | ORAL | Status: DC
  Administered 2013-09-17: 1 via ORAL
  Filled 2013-09-16 (×2): qty 1

## 2013-09-16 MED ORDER — OXYCODONE HCL 5 MG PO TABS: 5.0000 mg | ORAL_TABLET | Freq: Four times a day (QID) | ORAL | Status: DC | PRN

## 2013-09-16 NOTE — Progress Notes (Signed)
9 Days Post-Op Procedure(s) (LRB): VIDEO ASSISTED THORACOSCOPY (VATS)/ DRAINAGE OF EMPYEMA/ DECORTICATION (Right) Subjective: Feels well today Still some discomfort but is improving Appetite is good  Objective: Vital signs in last 24 hours: Temp:  [97.8 F (36.6 C)-98.9 F (37.2 C)] 97.8 F (36.6 C) (08/20 1335) Pulse Rate:  [79-87] 86 (08/20 1335) Cardiac Rhythm:  [-]  Resp:  [17-18] 17 (08/20 1335) BP: (135-149)/(74-87) 135/74 mmHg (08/20 1335) SpO2:  [98 %-100 %] 98 % (08/20 1335)  Hemodynamic parameters for last 24 hours:    Intake/Output from previous day: 08/19 0701 - 08/20 0700 In: 960 [P.O.:360; IV Piggyback:600] Out: 3620 [Urine:3620] Intake/Output this shift: Total I/O In: 999 [P.O.:999] Out: 1225 [Urine:1225]  General appearance: alert and no distress Lungs: diminished breath sounds right base Wound: clean and dry  Lab Results:  Recent Labs  09/15/13 0507 09/16/13 0423  WBC 12.6* 9.7  HGB 8.1* 7.9*  HCT 23.8* 23.3*  PLT 580* 569*   BMET:  Recent Labs  09/15/13 0507 09/16/13 0423  NA 144 144  K 4.3 4.0  CL 107 106  CO2 26 27  GLUCOSE 109* 100*  BUN 11 11  CREATININE 1.64* 1.64*  CALCIUM 8.6 8.7    PT/INR: No results found for this basename: LABPROT, INR,  in the last 72 hours ABG    Component Value Date/Time   PHART 7.421 09/08/2013 0502   HCO3 23.3 09/08/2013 0502   TCO2 24.4 09/08/2013 0502   ACIDBASEDEF 0.6 09/08/2013 0502   O2SAT 94.2 09/08/2013 0502   CBG (last 3)  No results found for this basename: GLUCAP,  in the last 72 hours  Assessment/Plan: S/P Procedure(s) (LRB): VIDEO ASSISTED THORACOSCOPY (VATS)/ DRAINAGE OF EMPYEMA/ DECORTICATION (Right) - Day 10 Zosyn. I went ahead and ordered Zosyn to stop after his dose tonight and then start PO augmentin tomorrow AM- to continue another 10 days  Ok to [employer] tomorrow from my standpoint  I will see him back in about 3 weeks   LOS: 10 days    Lupie Sawa C 09/16/2013

## 2013-09-16 NOTE — Progress Notes (Signed)
[facility] PROGRESS NOTE  Frank Solomon PQZ:[id number] DOB: 27-Apr-1960 DOA: 09/06/2013 PCP: No primary provider on file.  Assessment/Plan: Active Problems:   Acute respiratory failure   Lung abscess   Empyema   Sepsis   Cardiomyopathy, dilated   Hypertensive heart disease    Admit HPI / Brief Narrative:  53 yo M w/ Hx Tobacco abuse, CHF, TIA who presented with acute resp failure. Pt reported sudden onset of CP and SOB over 2-3 days. Denied any trauma. On presentation to the ER. tmax 99.9, HR 100s-120s, RR- 20s-40, SBP 100s-130s. Satting > 95% on 3L. WBC 33.2, hgb 11, K 3.3, Cr 0.92. Trop and lactate WNL. CT angio of the chest noted 11x12 cm RLL air fluid level w/ presumed necrotic tissue in addition to R loculated empyema with infectious ground glass opacities and reactive mediastinal LAD. + mild mediastinal L shift. CTS was consulted and patient underwent VATS on 8/11 and was extubated on 8/12. He required vasopressors for 1-2 days, but currently stable on RA and resuming blood pressure medications. On monotherapy zosyn. Home once cleared by surgery. If planning for long course of IV antibiotics, will need PICC line, otherwise, please d/c CVC ASAP. Also had some melena but GI wants to have outpatient follow up due to pulmonary issues. Hemoglobin stable.   Assessment/Plan  Septic shock and acute hypoxic respiratory failure due to R empyema, vasopressors off and on RA, improving.  - S/P right VATS - post-op care per TCTS  - abx selection and tx course per [facility], as per their recommendation, continue zosyn on day 10, then switch to by mouth antibiotics likely Augmentin for another 10 days at discharge - Body fluid cx growing Strep group F  - Tissue culture with abundant Gram pos rods in pairs and abundant gram-negative rods; speciation and susceptibilities pending   RUQ pain, likely due to fluid above dome of liver , pulmonary process  - Mild transaminitis appears about stable, bilirubin  stable  - [place] 8/14 with sludge, no evidence of cholecystitis, just fluid above dome of liver which is probably reactive from lung issues.  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 Collene Mares, GI, recommended we continue to monitor the pt and consider endoscopic eval in f/u as outpt due to close proximity to his recent VATS  - Hgb approximately stable around 8. 0  Hypokalemia, resolved this AM  -Mg normal   Leukocytosis and thrombocytosis due to empyema, resolving.   AKI, likey due to ATN from septic shock and antibiotic exposure  - Creatinine stable  - Minimize nephrotoxins  - Renally dose medications   Diet: Healthy heart   Access: Right IJ CVC >> if prolonged IV antibiotics, consider PICC line, otherwise PIV  IVF: off  Proph: SCDs   Code Status: full code  Family Communication: patient alone  Disposition Plan: continue med-surg , no PT/OT needs, patient wants to discharge home tomorrow after receiving his 10  days of Zosyn  Consultants:  Dr. Modesto Charon (cardiothoracic surgery)  Dr.Vishal Mungal (PCCM)  GI - Dr. Collene Mares - via phone  Procedure/Significant Events:  8/10 CXR >>> b/l opacities, 3x4cm oval structure in RLL 8/10 Chest CTA >>> 11 x 5mm right lung base probable abscess with air fluid level, large right loculated pleural effusion vs empyema.  8/11VIDEO ASSISTED THORACOSCOPY (VATS)/ DRAINAGE OF EMPYEMA/ DECORTICATION (Right)  8/11 echocardiogram;- Left ventricle:  mild LVH. LVEF= 35% to 40%. Diffuse hypokinesis. grade 1 diastolic dysfunction 8/34 extubated  Antibiotics:  Zosyn 8/10 >>  HPI/Subjective:  Poor appetite but eating. Denies nausea, vomiting   Objective: Filed Vitals:   09/15/13 0551 09/15/13 1420 09/15/13 2201 09/16/13 0615  BP: 146/85 129/73 148/79 149/87  Pulse: 86 92 87 79  Temp: 99.4 F (37.4 C) 99 F (37.2 C) 98.9 F (37.2 C) 98.5 F (36.9 C)  TempSrc:  Oral Oral   Resp: 18 18 18 18   Height:      Weight:      SpO2: 96% 99% 100% 98%    Intake/Output Summary (Last 24 hours) at 09/16/13 1204 Last data filed at 09/16/13 1114  Gross per 24 hour  Intake   1080 ml  Output   3570 ml  Net  -2490 ml    Exam:  General: alert & oriented x 3 In NAD  Cardiovascular: RRR, nl S1 s2  Respiratory: Decreased breath sounds at the bases, scattered rhonchi, no crackles  Abdomen: soft +BS NT/ND, no masses palpable  Extremities: No cyanosis and no edema      Data Reviewed: Basic Metabolic Panel:  Recent Labs Lab 09/10/13 0700 09/11/13 0430 09/12/13 0300 09/13/13 0240 09/14/13 0435 09/15/13 0507 09/16/13 0423  NA 141 141 142 139 142 144 144  K 4.3 4.5 5.0 3.6* 4.3 4.3 4.0  CL 106 107 111 104 107 107 106  CO2 24 21 23 23 25 26 27   GLUCOSE 115* 114* 312* 106* 112* 109* 100*  BUN 11 12 10 9 10 11 11   CREATININE 1.47* 1.61* 1.67* 1.57* 1.60* 1.64* 1.64*  CALCIUM 8.3* 8.7 7.8* 8.3* 8.6 8.6 8.7  MG 2.0 2.2  --  2.1  --   --   --   PHOS 3.1 3.8  --   --   --   --   --     Liver Function Tests:  Recent Labs Lab 09/10/13 0700 09/11/13 0430 09/12/13 0300 09/13/13 0240 09/16/13 0423  AST 63* 86* 74* 75* 72*  ALT 21 29 28 31  34  ALKPHOS 91 103 87 104 95  BILITOT 0.5 0.5 0.3 0.3 0.3  PROT 6.8 6.7 5.6* 6.4 6.5  ALBUMIN 2.0* 1.9* 1.7* 2.0* 2.1*   No results found for this basename: LIPASE, AMYLASE,  in the last 168 hours No results found for this basename: AMMONIA,  in the last 168 hours  CBC:  Recent Labs Lab  09/10/13 0545 09/11/13 0430 09/12/13 0300 09/12/13 1435 09/13/13 0240 09/14/13 0435 09/15/13 0507 09/16/13 0423  WBC 15.1* 14.3* 13.6* 13.4* 14.5* 11.7* 12.6* 9.7  NEUTROABS 10.3* 9.6* 9.3*  --  9.4*  --   --   --   HGB 9.1* 9.0* 7.7* 8.6* 8.0* 8.1* 8.1* 7.9*  HCT 26.7* 26.8* 22.6* 25.7* 24.0* 24.0* 23.8* 23.3*  MCV 96.0 100.0 99.1 100.0 97.2 97.2 97.9 98.3  PLT 461* 484* 427* 513* 534* 539* 580* 569*    Cardiac Enzymes: No results found for this basename: CKTOTAL, CKMB, CKMBINDEX, TROPONINI,  in the last 168 hours BNP (last 3 results)  Recent Labs  09/06/13 2109  PROBNP 982.1*     CBG: No results found for this basename: GLUCAP,  in the last 168 hours  Recent Results (from the past 240 hour(s))  CULTURE, BLOOD (ROUTINE X 2)     Status: None   Collection Time    09/06/13  8:59 PM      Result Value Ref Range Status   Specimen Description BLOOD HAND RIGHT   Final   Special Requests BOTTLES DRAWN AEROBIC AND ANAEROBIC 5CC   Final   Culture  Setup Time     Final   Value: 09/07/2013 03:14     Performed at [facility]   Culture     Final   Value: NO GROWTH 5 DAYS     Performed at [facility]   Report Status 09/13/2013 FINAL   Final  CULTURE, BLOOD (ROUTINE X 2)     Status: None   Collection Time    09/06/13  9:23 PM      Result Value Ref Range Status   Specimen Description BLOOD FOREARM RIGHT   Final   Special Requests BOTTLES DRAWN AEROBIC ONLY 2CC   Final   Culture  Setup Time     Final   Value: 09/07/2013 03:15     Performed at [facility]   Culture     Final   Value: NO GROWTH 5 DAYS     Performed at [facility]   Report Status 09/13/2013 FINAL   Final  URINE CULTURE     Status: None   Collection Time    09/07/13  1:21 AM      Result Value Ref Range Status   Specimen Description URINE, CLEAN CATCH   Final   Special Requests NONE   Final   Culture  Setup Time     Final   Value: 09/07/2013 04:28     Performed at [facility]     Final   Value: NO GROWTH     Performed at [facility]   Culture     Final   Value: NO GROWTH     Performed at [facility]   Report Status 09/08/2013 FINAL   Final  MRSA PCR SCREENING     Status: None   Collection Time    09/07/13  2:00 AM      Result Value Ref Range Status   MRSA by PCR NEGATIVE  NEGATIVE Final   Comment:            The GeneXpert MRSA Assay (FDA     approved for NASAL specimens     only), is one component of a     comprehensive MRSA colonization     surveillance program. It is not     intended to diagnose MRSA     infection nor to guide or     monitor treatment for     MRSA infections.  BODY FLUID CULTURE     Status: None   Collection Time    09/07/13 10:29 AM      Result Value Ref Range Status   Specimen Description FLUID RIGHT PLEURAL   Final   Special Requests POF VANCOMYCIN   Final   Gram Stain     Final   Value: NO WBC SEEN     ABUNDANT GRAM VARIABLE ROD     RARE GRAM  POSITIVE COCCI     IN CHAINS Gram Stain Report Called to,Read Back By and Verified With: Gram Stain Report Called to,Read Back By and Verified With: CHRISTINE @ [id number] ON 09/10/2013 BY WEBSP     Performed at [facility]   Culture     Final   Value: RARE STREPTOCOCCUS GROUP F     Note: CRITICAL RESULT CALLED TO, READ BACK BY AND VERIFIED WITH: CHRISTINE     Performed at [facility]   Report Status 09/10/2013 FINAL   Final  ANAEROBIC CULTURE     Status: None   Collection Time    09/07/13 10:29 AM      Result Value Ref Range Status   Specimen Description FLUID RIGHT PLEURAL   Final   Special Requests POF VANCOMYCIN   Final   Gram Stain     Final   Value: NO WBC SEEN     ABUNDANT GRAM VARIABLE ROD     RARE GRAM POSITIVE COCCI     IN CHAINS     Performed at [facility]   Culture     Final   Value: NO ANAEROBES ISOLATED     Performed at [facility]   Report Status 09/13/2013 FINAL   Final  TISSUE  CULTURE     Status: None   Collection Time    09/07/13 10:30 AM      Result Value Ref Range Status   Specimen Description TISSUE RIGHT PLEURAL   Final   Special Requests POF ON VANCOMYCIN RIGHT PLEURAL PEEL   Final   Gram Stain     Final   Value: RARE WBC PRESENT, PREDOMINANTLY MONONUCLEAR     NO SQUAMOUS EPITHELIAL CELLS SEEN     ABUNDANT GRAM POSITIVE RODS     IN PAIRS ABUNDANT GRAM NEGATIVE RODS     FEW GRAM POSITIVE RODS   Culture     Final   Value: NO GROWTH 3 DAYS     Performed at [facility]   Report Status 09/10/2013 FINAL   Final  ANAEROBIC CULTURE     Status: None   Collection Time    09/07/13 10:30 AM      Result Value Ref Range Status   Specimen Description TISSUE RIGHT PLEURAL   Final   Special Requests POF ON VANCOMYCIN RIGHT PLEURAL PEEL   Final   Gram Stain     Final   Value: RARE WBC PRESENT, PREDOMINANTLY MONONUCLEAR     NO SQUAMOUS EPITHELIAL CELLS SEEN     ABUNDANT GRAM POSITIVE RODS     IN PAIRS ABUNDANT GRAM NEGATIVE RODS     FEW GRAM POSITIVE RODS   Culture     Final   Value: BACTEROIDES UNIFORMIS     Note: BETA LACTAMASE NEGATIVE     Performed at [facility]   Report Status 09/14/2013 FINAL   Final  TISSUE CULTURE     Status: None   Collection Time    09/07/13 11:10 AM      Result Value Ref Range Status   Specimen Description TISSUE RIGHT PLEURAL   Final   Special Requests POF VANCOMYCIN RIGHT VISCERAL PLEURAL PEEL   Final   Gram Stain     Final   Value: NO WBC SEEN     NO SQUAMOUS EPITHELIAL CELLS SEEN     NO ORGANISMS SEEN     Performed at [facility]     Final  Value: NO GROWTH 3 DAYS     Performed at [facility]   Report Status 09/10/2013 FINAL   Final  ANAEROBIC CULTURE     Status: None   Collection Time    09/07/13 11:10 AM      Result Value Ref Range Status   Specimen Description TISSUE RIGHT PLEURAL   Final   Special Requests POF VANCOMYCIN RIGHT VISERSL PLEURAL PEEL   Final   Gram  Stain     Final   Value: NO WBC SEEN     NO SQUAMOUS EPITHELIAL CELLS SEEN     NO ORGANISMS SEEN     Performed at [facility]   Culture     Final   Value: NO ANAEROBES ISOLATED     Performed at [facility]   Report Status 09/12/2013 FINAL   Final     Studies: Dg Chest 2 View  09/16/2013   CLINICAL DATA:  Followup pleural effusion  EXAM: CHEST  2 VIEW  COMPARISON:  09/12/2013  FINDINGS: Cardiomediastinal silhouette is stable. Elevation of the right hemidiaphragm again noted. Stable small right pleural effusion with right basilar atelectasis or infiltrate. Linear scarring or atelectasis in lingula. No pulmonary edema. Stable right IJ central line position.  IMPRESSION: Elevation of the right hemidiaphragm. Stable small right pleural effusion with right basilar atelectasis or infiltrate. No pulmonary edema.   Electronically Signed   By: Lahoma Crocker M.D.   On: 09/16/2013 08:07   Dg Chest 2 View  09/12/2013   CLINICAL DATA:  Status post decortication, fall profusion  EXAM: CHEST  2 VIEW  COMPARISON:  Chest radiograph 09/11/2013  FINDINGS: Right central venous line unchanged. Normal cardiac silhouette. There is a right pleural effusion which is partially loculated. There is right lower lobe opacities. No pneumothorax.  IMPRESSION: No change in the right pleural fluid and right basilar opacities. No pneumothorax.   Electronically Signed   By: Suzy Bouchard M.D.   On: 09/12/2013 07:46   Chest 2 View  09/07/2013   CLINICAL DATA:  Preoperative evaluation. Draining abscess. Lung infection.  EXAM: CHEST  2 VIEW  COMPARISON:  Chest radiograph 09/06/2013; chest CT 09/06/2013.  FINDINGS: Stable enlarged cardiac and mediastinal contours with leftward mediastinal shift. Re- demonstrated air-fluid level within the right lower hemithorax compatible with probable large loculated right pleural effusion/empyema. Underlying right lung base consolidative opacities. Small left pleural effusion.   IMPRESSION: Re- demonstrated large right pleural effusion with air-fluid level and underlying consolidative opacities. Opacities are concerning for infection. Large right pleural fluid collection may represent empyema. Gas may be secondary to infection or bronchopleural fistula.   Electronically Signed   By: Lovey Newcomer M.D.   On: 09/07/2013 09:00   Ct Angio Chest Pe W/cm &/or Wo Cm  09/06/2013   CLINICAL DATA:  Chest pain after lifting injury.  New onset cough.  EXAM: CT ANGIOGRAPHY CHEST WITH CONTRAST  TECHNIQUE: Multidetector CT imaging of the chest was performed using the standard protocol during bolus administration of intravenous contrast. Multiplanar CT image reconstructions and MIPs were obtained to evaluate the vascular anatomy.  CONTRAST:  10mL OMNIPAQUE IOHEXOL 350 MG/ML SOLN  COMPARISON:  Chest radiograph September 06, 2013  FINDINGS: Adequate pulmonary arterial contrast opacification. No pulmonary arterial filling defects to the level of the subsegmental branches.  At least 11 x 12 cm right lower lobe air-fluid level, with thick rind of low-density presumed necrotic tissue, appearing extrapleural with enhancing atelectasis anteriorly, surrounding ground-glass nodules and  loculated component. Low-density component along the anterior pleural surface of the right upper lobe. Small left pleural effusion.  12 mm short axis pretracheal, 14 mm short axis subcarinal lymph node. Inflammatory changes of the cardiophrenic angle with probable lymphadenopathy. Tracheobronchial tree is patent and midline. No pneumothorax.  Right lung process mildly displaces the heart to the left. Pericardium is nonsuspicious. Thoracic aorta is normal in course and caliber with mild calcific atherosclerosis.  Included view of the abdomen is unremarkable. Soft tissues are nonsuspicious. No acute osseous process.  Review of the MIP images confirms the above findings.  IMPRESSION: No acute pulmonary embolism.  At least 11 x 12 mm right  lung base probable abscess with air-fluid level (which can also be seen with bronchopleural fistula), in addition to large right loculated pleural effusion/empyema. Associated ground-glass opacities are probably infectious. Mediastinal lymphadenopathy is likely reactive. Right lung process results in mild mediastinal shift to the left.  Small left pleural effusion.  Findings discussed with and reconfirmed by Dr.EMILY WEST on8/10/2015at11:09 pm.   Electronically Signed   By: Elon Alas   On: 09/06/2013 23:13   US Abdomen Complete  09/10/2013   CLINICAL DATA:  Right upper quadrant pain.  EXAM: ULTRASOUND ABDOMEN COMPLETE  COMPARISON:  None.  FINDINGS: Gallbladder:  There is a 1.8 cm stone in the lower gallbladder segment has well is dependent sludge. No wall thickening or pericholecystic fluid. No evidence of acute cholecystitis.  Common bile duct:  Diameter: 5 mm.  Liver:  Limited evaluation of the liver. Liver normal in overall size. Small amount of fluid is suggested over the dome of the liver. There is a somewhat coarsened liver echotexture. No liver mass or focal lesion is seen.  IVC:  No abnormality visualized.  Pancreas:  Not visualized.  Spleen:  Size and appearance within normal limits.  Right Kidney:  Length: 12 cm. Echogenicity within normal limits. No mass or hydronephrosis visualized.  Left Kidney:  Length: 12 cm. Echogenicity within normal limits. No mass or hydronephrosis visualized.  Abdominal aorta:  Not visualized.  Other findings:  None.  IMPRESSION: 1. No acute findings. 2. Cholelithiasis and gallbladder sludge.  No acute cholecystitis. 3. Suboptimal visualization of the liver. No liver mass or focal lesion. 4. Small amount of fluid is seen over the dome of the liver. 5. Pancreas and aorta not visualized.   Electronically Signed   By: Lajean Manes M.D.   On: 09/10/2013 18:15   US Abdomen Limited  09/15/2013   CLINICAL DATA:  ruq pain  EXAM: US ABDOMEN LIMITED - RIGHT UPPER QUADRANT   COMPARISON:  09/10/2013  FINDINGS: Gallbladder:  2.2 cm stone in the gallbladder neck. There is borderline wall thickening, 3 mm. Sonographer reports positive sonographic Murphy sign. No pericholecystic fluid is identified.  Common bile duct:  Diameter: 3.8 mm, unremarkable  Liver:  No focal lesion identified. Within normal limits in parenchymal echogenicity.  IMPRESSION: 1. Cholelithiasis with borderline gallbladder wall thickening.   Electronically Signed   By: Arne Cleveland M.D.   On: 09/15/2013 16:06   Dg Chest Port 1 View  09/11/2013   CLINICAL DATA:  Chest tube removal  EXAM: PORTABLE CHEST - 1 VIEW  COMPARISON:  09/11/2013  FINDINGS: Large cardiac silhouette. No airspace disease and effusion at the right lung base unchanged. Interval removal right chest tube No pneumothorax.  IMPRESSION: No change in right lower pneumonia and parapneumonic effusion.  Removal of right chest tube.  No pneumothorax.   Electronically Signed  By: Suzy Bouchard M.D.   On: 09/11/2013 15:33   Dg Chest Port 1 View  09/11/2013   CLINICAL DATA:  Status post drainage of empyema with chest tube placement.  EXAM: PORTABLE CHEST - 1 VIEW  COMPARISON:  Multiple priors.  Also CT chest 09/06/2013.  FINDINGS: RIGHT basilar chest tube remains. Moderate RIGHT pleural fluid is stable. There is no pneumothorax. Central venous catheter tip superior vena cava unchanged. Stable enlarged cardiomediastinal silhouette and stable LEFT hemithorax.  IMPRESSION: Stable chest.   Electronically Signed   By: Rolla Flatten M.D.   On: 09/11/2013 08:32   Dg Chest Port 1 View  09/10/2013   CLINICAL DATA:  Follow-up far ongoing decortication  EXAM: PORTABLE CHEST - 1 VIEW  COMPARISON:  Portable chest x-ray of September 09, 2013  FINDINGS: The left lung is clear. On the right increasing atelectatic change is present. A small amount of pleural fluid persists. The medial upper chest tube and the medial lower chest tube remain. The more lateral upper chest  tube is been removed. There is no mediastinal shift. The cardiopericardial silhouette is mildly enlarged but stable. The right internal jugular venous catheter tip lies in the region of the proximal SVC.  IMPRESSION: There has been slight interval increase in pleural fluid and atelectasis in the right lung since yesterday's study. There is no pneumothorax.   Electronically Signed   By: David  Martinique   On: 09/10/2013 08:06   Dg Chest Port 1 View  09/09/2013   CLINICAL DATA:  Status post VATS  EXAM: PORTABLE CHEST - 1 VIEW  COMPARISON:  09/08/2013  FINDINGS: Cardiac shadow is stable in enlarged. A right jugular line and three right-sided thoracostomy catheters are again seen and stable. No pneumothorax is noted. Left lung remains clear. Right basilar atelectasis/effusion is again noted.  IMPRESSION: Stable changes on the right.  No new acute abnormality is noted.   Electronically Signed   By: Inez Catalina M.D.   On: 09/09/2013 08:07   Dg Chest Port 1 View  09/08/2013   CLINICAL DATA:  Video assisted thoracoscopy for drainage of empyema.  EXAM: PORTABLE CHEST - 1 VIEW  COMPARISON:  09/07/2013  FINDINGS: Endotracheal tube has its tip 3 cm above the carina. Nasogastric tube enters the abdomen. 3 chest tubes are in place on the right. There is no pneumothorax. Right internal jugular central line has its tip in the SVC above the right atrium. There is worsening volume loss in both lower lobes.  IMPRESSION: Three chest tubes in place on the right. No pneumothorax. Worsening volume loss in both lower lobes.   Electronically Signed   By: Nelson Chimes M.D.   On: 09/08/2013 08:14   Dg Chest Port 1 View  09/07/2013   CLINICAL DATA:  Endotracheal and chest tube placement. Video-assisted thoracoscopy with drainage of empyema and decortication, right chest. Postop day 0  EXAM: PORTABLE CHEST - 1 VIEW  COMPARISON:  09/07/2013  FINDINGS: Endotracheal tube tip: 2.7 cm above the carina. Right IJ line tip: SVC.  Multiple  right-sided chest tubes are present. Small right basilar pneumothorax peripherally. There is fluid in the minor fissure. Reduced atelectasis at the right lung base, with some underlying interstitial and patchy airspace opacity in the right lung.  Suspected mild atelectasis in the left upper lobe. Mildly enlarged cardiopericardial silhouette. Significantly reduced right pleural effusion. Mildly blunted left lateral costophrenic angle.  IMPRESSION: 1. Interval right empyema drainage and decortication, with small right peripheral basilar pneumothorax,  significant decrease in right pleural effusion and right basilar atelectasis, and multiple chest tubes in place. There is some patchy interstitial and airspace opacity in the right lung. 2. Mild atelectasis in the left upper lobe. 3. Mildly enlarged cardiopericardial silhouette. 4. Tubes and lines appear satisfactorily positioned.   Electronically Signed   By: Sherryl Barters M.D.   On: 09/07/2013 14:13   Dg Chest Port 1 View  09/06/2013   CLINICAL DATA:  53 year old male with shortness of breath and respiratory distress  EXAM: PORTABLE CHEST - 1 VIEW  COMPARISON:  01/04/1998 chest radiograph  FINDINGS: Pulmonary vascular congestion is noted.  Opacities overlying both lower lungs, right greater than left, probably represents effusion and atelectasis/airspace disease.  A 3 x 4 cm oval structure overlying the right lower lung could represent a focal mass.  No acute bony abnormalities are noted.  There is no evidence of pneumothorax.  IMPRESSION: Bilateral lower lung opacities, right greater than left, likely representing pleural effusions and atelectasis/airspace disease.  3 x 4 cm oval structure overlying the right lower lung could represent focal mass. Radiographic follow-up recommended.  Pulmonary vascular congestion.   Electronically Signed   By: Hassan Rowan M.D.   On: 09/06/2013 20:48    Scheduled Meds: . carvedilol  12.5 mg Oral BID WC  . feeding supplement  (RESOURCE BREEZE)  1 Container Oral TID BM  . furosemide  20 mg Oral Daily  . pantoprazole  40 mg Oral BID  . piperacillin-tazobactam (ZOSYN)  IV  3.375 g Intravenous 3 times per day  . senna-docusate  2 tablet Oral QHS  . sodium chloride  3 mL Intravenous Q12H   Continuous Infusions: . sodium chloride 10 mL/hr at 09/13/13 1417    Active Problems:   Acute respiratory failure   Lung abscess   Empyema   Sepsis   Cardiomyopathy, dilated   Hypertensive heart disease    Time spent: 40 minutes   [facility] Pager [phone]. If 7PM-7AM, please contact night-coverage at [email or web address], password [facility] 09/16/2013, 12:04 PM  LOS: 10 days

## 2013-09-16 NOTE — Evaluation (Signed)
Occupational Therapy Evaluation and Discharge Patient Details Name: Frank Solomon MRN: [id number] DOB: August 16, 1960 Today's Date: 09/16/2013    History of Present Illness Pt adm with rt empyema and underwent rt VATS and decortication on 09/07/13. PMH-CHF, TIA   Clinical Impression   Pt reports routinely taking himself to the bathroom and performing self care this morning at the sink.  Modified independent in mobility pushing IV pole.  Pt educated in techniques to minimize pain during LB bathing and dressing and IADL and mobility and in energy conservation.  Encouraged incentive spirometer and ambulation with nursing staff.  Pt verbalized understanding.  No further OT needs.    Follow Up Recommendations  Supervision - Intermittent;No OT follow up    Equipment Recommendations  None recommended by OT    Recommendations for Other Services       Precautions / Restrictions Precautions Precautions: None Restrictions Weight Bearing Restrictions: No      Mobility Bed Mobility Overal bed mobility: Modified Independent Bed Mobility: Rolling;Sidelying to Sit;Sit to Sidelying Rolling: Modified independent (Device/Increase time) Sidelying to sit: Modified independent (Device/Increase time)     Sit to sidelying: Modified independent (Device/Increase time) General bed mobility comments: log roll technique to minimize pain  Transfers Overall transfer level: Modified independent Equipment used:  (IV pole) Transfers: Sit to/from Stand Sit to Stand: Modified independent (Device/Increase time)         General transfer comment: incr time due to pain; no physical (A) needed     Balance                                            ADL Overall ADL's : Modified independent                                       General ADL Comments: Intructed pt in energy conservation and splinting R side to minimize pain. Pt has a good support system to assist him with  errands and IADL at home. Instructed pt to avoid bending to donn socks and wash feet and to cross foot over opposite knee.     Vision                     Perception     Praxis      Pertinent Vitals/Pain Pain Assessment: 0-10 Pain Score:  (did not rate) Pain Location: R side Pain Descriptors / Indicators: Sore Pain Intervention(s): Repositioned;Premedicated before session     Hand Dominance Right   Extremity/Trunk Assessment Upper Extremity Assessment Upper Extremity Assessment: Overall WFL for tasks assessed   Lower Extremity Assessment Lower Extremity Assessment: Overall WFL for tasks assessed       Communication Communication Communication: No difficulties   Cognition Arousal/Alertness: Awake/alert Behavior During Therapy: WFL for tasks assessed/performed Overall Cognitive Status: Within Functional Limits for tasks assessed                     General Comments       Exercises       Shoulder Instructions      Home Living Family/patient expects to be discharged to:: Private residence Living Arrangements: Alone Available Help at Discharge: Family;Available PRN/intermittently Type of Home: Apartment Home Access: Stairs to enter Entrance Stairs-Number of Steps: 4 Entrance Stairs-Rails: Right Home  Layout: Two level Alternate Level Stairs-Number of Steps: 8x2 Alternate Level Stairs-Rails: Right Bathroom Shower/Tub: Tub/shower unit Shower/tub characteristics: Curtain Biochemist, clinical: Handicapped height     Home Equipment: Shower seat - built in;Grab bars - toilet;Grab bars - tub/shower          Prior Functioning/Environment Level of Independence: Independent        Comments: pt drives    OT Diagnosis:     OT Problem List:     OT Treatment/Interventions:      OT Goals(Current goals can be found in the care plan section) Acute Rehab OT Goals Patient Stated Goal: to go home tomorrow   OT Frequency:     Barriers to D/C:             Co-evaluation              End of Session    Activity Tolerance: Patient tolerated treatment well Patient left: in bed;with call bell/phone within reach   Time: 0959-1019 OT Time Calculation (min): 20 min Charges:  OT General Charges $OT Visit: 1 Procedure OT Evaluation $Initial OT Evaluation Tier I: 1 Procedure OT Treatments $Self Care/Home Management : 8-22 mins G-Codes:    Malka So 09/16/2013, 10:20 AM [phone]

## 2013-09-17 LAB — CBC
HCT: 24.5 % — ABNORMAL LOW (ref 39.0–52.0)
Hemoglobin: 8.2 g/dL — ABNORMAL LOW (ref 13.0–17.0)
MCH: 33.1 pg (ref 26.0–34.0)
MCHC: 33.5 g/dL (ref 30.0–36.0)
MCV: 98.8 fL (ref 78.0–100.0)
Platelets: 603 10*3/uL — ABNORMAL HIGH (ref 150–400)
RBC: 2.48 MIL/uL — ABNORMAL LOW (ref 4.22–5.81)
RDW: 15.2 % (ref 11.5–15.5)
WBC: 9.3 10*3/uL (ref 4.0–10.5)

## 2013-09-17 MED ORDER — FUROSEMIDE 20 MG PO TABS: 20.0000 mg | ORAL_TABLET | Freq: Every day | ORAL | Status: DC

## 2013-09-17 MED ORDER — AMOXICILLIN-POT CLAVULANATE 875-125 MG PO TABS: 1.0000 | ORAL_TABLET | Freq: Two times a day (BID) | ORAL | Status: AC

## 2013-09-17 MED ORDER — PANTOPRAZOLE SODIUM 40 MG PO TBEC: 40.0000 mg | DELAYED_RELEASE_TABLET | Freq: Two times a day (BID) | ORAL | Status: DC

## 2013-09-17 MED ORDER — SENNA 8.6 MG PO TABS: 1.0000 | ORAL_TABLET | Freq: Every day | ORAL | Status: DC | PRN

## 2013-09-17 NOTE — Progress Notes (Addendum)
      [address]       [place] [address]             [phone]      10 Days Post-Op Procedure(s) (LRB): VIDEO ASSISTED THORACOSCOPY (VATS)/ DRAINAGE OF EMPYEMA/ DECORTICATION (Right)  Subjective:  Patient states he is draining from his surgical incision.  We were contacted by the nurse stating the area is enlarged to the size of a baseball and is draining purulent drainage.  Objective: Vital signs in last 24 hours: Temp:  [97.8 F (36.6 C)-98.4 F (36.9 C)] 98.4 F (36.9 C) (08/21 0504) Pulse Rate:  [77-86] 77 (08/21 0504) Cardiac Rhythm:  [-]  Resp:  [16-18] 18 (08/21 0504) BP: (135)/(74-79) 135/79 mmHg (08/21 0504) SpO2:  [93 %-98 %] 97 % (08/21 0504)  Intake/Output from previous day: 08/20 0701 - 08/21 0700 In: 1117 [P.O.:1117] Out: 2800 [Urine:2800] Intake/Output this shift: Total I/O In: 356 [P.O.:356] Out: 1025 [Urine:1025]  General appearance: alert, cooperative and no distress Heart: regular rate and rhythm Lungs: clear to auscultation bilaterally Wound: Chest tube sites are well healed, the thoracotomy incision has a small area which has opened along the medical aspect.  + purulent drainage on dressing, however unable to express any drainage.  No erythema, area is swollen and indurated  Lab Results:  Recent Labs  09/16/13 0423 09/17/13 0359  WBC 9.7 9.3  HGB 7.9* 8.2*  HCT 23.3* 24.5*  PLT 569* 603*   BMET:  Recent Labs  09/15/13 0507 09/16/13 0423  NA 144 144  K 4.3 4.0  CL 107 106  CO2 26 27  GLUCOSE 109* 100*  BUN 11 11  CREATININE 1.64* 1.64*  CALCIUM 8.6 8.7    PT/INR: No results found for this basename: LABPROT, INR,  in the last 72 hours ABG    Component Value Date/Time   PHART 7.421 09/08/2013 0502   HCO3 23.3 09/08/2013 0502   TCO2 24.4 09/08/2013 0502   ACIDBASEDEF 0.6 09/08/2013 0502   O2SAT 94.2 09/08/2013 0502   CBG (last 3)  No results found for this basename: GLUCAP,  in the last 72  hours  Assessment/Plan: S/P Procedure(s) (LRB): VIDEO ASSISTED THORACOSCOPY (VATS)/ DRAINAGE OF EMPYEMA/ DECORTICATION (Right)  1. Surgical incisions previously healing without difficulty.  Now Mini Thoracotomy incision has area of ? Stitch expression along medial aspect, purulent drainage present on dressing, + induration- patient has received 10 days of Zosyn and was transitioned to oral medications, ideally this should cover a wound infection 2. Dispo- possible abscess collection vs. Expulsion of stitch along medical portion of incision.  Dr. Roxan Hockey will evaluate, would hold discharge for now.   LOS: 11 days    Ellwood Handler 09/17/2013  Patient seen and examined. Agree with above. There is swelling, but only mildy tender and no significant erythema. I do not think it is indicated to open up his incision at this time  My recommendation would be to go ahead and dc him on Augmentin as planned He will call if he develops increasing drainage, tenderness or fevers. We will have him ome to our office next week to have his wound checked

## 2013-09-17 NOTE — Discharge Summary (Addendum)
Physician Discharge Summary  Azavion Bouillon MRN: [id number] DOB/AGE: February 24, 1960 53 y.o.  PCP: No primary provider on file.   Admit date: 09/06/2013 Discharge date: 09/17/2013  Discharge Diagnoses:   VIDEO ASSISTED THORACOSCOPY (VATS)/ DRAINAGE OF EMPYEMA/ DECORTICATION     Acute respiratory failure   Lung abscess   Empyema   Sepsis   Cardiomyopathy, dilated   Hypertensive heart disease  Follow up recommendations Follow up with Melrose Nakayama, MD as scheduled Follow up with PCP in 1 week Labs CMP and CBC in one week      Medication List    STOP taking these medications       ibuprofen 200 MG tablet  Commonly known as:  ADVIL,MOTRIN     quinapril 40 MG tablet  Commonly known as:  ACCUPRIL      TAKE these medications       amLODipine 5 MG tablet  Commonly known as:  NORVASC  Take 5 mg by mouth daily.     amoxicillin-clavulanate 875-125 MG per tablet  Commonly known as:  AUGMENTIN  Take 1 tablet by mouth every 12 (twelve) hours.     Ben Gay Ultra Strength 05-07-28 % Crea  Generic drug:  Camphor-Menthol-Methyl Sal  Apply 1 application topically as needed (for sore muscles).     carvedilol 12.5 MG tablet  Commonly known as:  COREG  Take 12.5 mg by mouth 2 (two) times daily.     cetirizine 10 MG tablet  Commonly known as:  ZYRTEC  Take 10 mg by mouth daily.     furosemide 20 MG tablet  Commonly known as:  LASIX  Take 1 tablet (20 mg total) by mouth daily.     hydrALAZINE 25 MG tablet  Commonly known as:  APRESOLINE  Take 25 mg by mouth 2 (two) times daily.     oxyCODONE 5 MG immediate release tablet  Commonly known as:  Oxy IR/ROXICODONE  Take 1-2 tablets (5-10 mg total) by mouth every 6 (six) hours as needed for severe pain.     pantoprazole 40 MG tablet  Commonly known as:  PROTONIX  Take 1 tablet (40 mg total) by mouth 2 (two) times daily.     senna 8.6 MG Tabs tablet  Commonly known as:  SENOKOT  Take 1 tablet (8.6 mg total) by mouth  daily as needed for mild constipation (when constipated beyond scheduled dose).        Discharge Condition: *   Disposition:    Consults: Cardiothoracic surgery   Significant Diagnostic Studies: Dg Chest 2 View  09/16/2013   CLINICAL DATA:  Followup pleural effusion  EXAM: CHEST  2 VIEW  COMPARISON:  09/12/2013  FINDINGS: Cardiomediastinal silhouette is stable. Elevation of the right hemidiaphragm again noted. Stable small right pleural effusion with right basilar atelectasis or infiltrate. Linear scarring or atelectasis in lingula. No pulmonary edema. Stable right IJ central line position.  IMPRESSION: Elevation of the right hemidiaphragm. Stable small right pleural effusion with right basilar atelectasis or infiltrate. No pulmonary edema.   Electronically Signed   By: Lahoma Crocker Solomon.D.   On: 09/16/2013 08:07   Dg Chest 2 View  09/12/2013   CLINICAL DATA:  Status post decortication, fall profusion  EXAM: CHEST  2 VIEW  COMPARISON:  Chest radiograph 09/11/2013  FINDINGS: Right central venous line unchanged. Normal cardiac silhouette. There is a right pleural effusion which is partially loculated. There is right lower lobe opacities. No pneumothorax.  IMPRESSION: No change in  the right pleural fluid and right basilar opacities. No pneumothorax.   Electronically Signed   By: Suzy Bouchard Solomon.D.   On: 09/12/2013 07:46   Chest 2 View  09/07/2013   CLINICAL DATA:  Preoperative evaluation. Draining abscess. Lung infection.  EXAM: CHEST  2 VIEW  COMPARISON:  Chest radiograph 09/06/2013; chest CT 09/06/2013.  FINDINGS: Stable enlarged cardiac and mediastinal contours with leftward mediastinal shift. Re- demonstrated air-fluid level within the right lower hemithorax compatible with probable large loculated right pleural effusion/empyema. Underlying right lung base consolidative opacities. Small left pleural effusion.  IMPRESSION: Re- demonstrated large right pleural effusion with air-fluid level and  underlying consolidative opacities. Opacities are concerning for infection. Large right pleural fluid collection may represent empyema. Gas may be secondary to infection or bronchopleural fistula.   Electronically Signed   By: Lovey Newcomer Solomon.D.   On: 09/07/2013 09:00   Ct Angio Chest Pe W/cm &/or Wo Cm  09/06/2013   CLINICAL DATA:  Chest pain after lifting injury.  New onset cough.  EXAM: CT ANGIOGRAPHY CHEST WITH CONTRAST  TECHNIQUE: Multidetector CT imaging of the chest was performed using the standard protocol during bolus administration of intravenous contrast. Multiplanar CT image reconstructions and MIPs were obtained to evaluate the vascular anatomy.  CONTRAST:  26m OMNIPAQUE IOHEXOL 350 MG/ML SOLN  COMPARISON:  Chest radiograph September 06, 2013  FINDINGS: Adequate pulmonary arterial contrast opacification. No pulmonary arterial filling defects to the level of the subsegmental branches.  At least 11 x 12 cm right lower lobe air-fluid level, with thick rind of low-density presumed necrotic tissue, appearing extrapleural with enhancing atelectasis anteriorly, surrounding ground-glass nodules and loculated component. Low-density component along the anterior pleural surface of the right upper lobe. Small left pleural effusion.  12 mm short axis pretracheal, 14 mm short axis subcarinal lymph node. Inflammatory changes of the cardiophrenic angle with probable lymphadenopathy. Tracheobronchial tree is patent and midline. No pneumothorax.  Right lung process mildly displaces the heart to the left. Pericardium is nonsuspicious. Thoracic aorta is normal in course and caliber with mild calcific atherosclerosis.  Included view of the abdomen is unremarkable. Soft tissues are nonsuspicious. No acute osseous process.  Review of the MIP images confirms the above findings.  IMPRESSION: No acute pulmonary embolism.  At least 11 x 12 mm right lung base probable abscess with air-fluid level (which can also be seen with  bronchopleural fistula), in addition to large right loculated pleural effusion/empyema. Associated ground-glass opacities are probably infectious. Mediastinal lymphadenopathy is likely reactive. Right lung process results in mild mediastinal shift to the left.  Small left pleural effusion.  Findings discussed with and reconfirmed by Dr.EMILY WEST on8/10/2015at11:09 pm.   Electronically Signed   By: CElon Alas  On: 09/06/2013 23:13   [place]Abdomen Complete  09/10/2013   CLINICAL DATA:  Right upper quadrant pain.  EXAM: ULTRASOUND ABDOMEN COMPLETE  COMPARISON:  None.  FINDINGS: Gallbladder:  There is a 1.8 cm stone in the lower gallbladder segment has well is dependent sludge. No wall thickening or pericholecystic fluid. No evidence of acute cholecystitis.  Common bile duct:  Diameter: 5 mm.  Liver:  Limited evaluation of the liver. Liver normal in overall size. Small amount of fluid is suggested over the dome of the liver. There is a somewhat coarsened liver echotexture. No liver mass or focal lesion is seen.  IVC:  No abnormality visualized.  Pancreas:  Not visualized.  Spleen:  Size and appearance within normal limits.  Right Kidney:  Length: 12 cm. Echogenicity within normal limits. No mass or hydronephrosis visualized.  Left Kidney:  Length: 12 cm. Echogenicity within normal limits. No mass or hydronephrosis visualized.  Abdominal aorta:  Not visualized.  Other findings:  None.  IMPRESSION: 1. No acute findings. 2. Cholelithiasis and gallbladder sludge.  No acute cholecystitis. 3. Suboptimal visualization of the liver. No liver mass or focal lesion. 4. Small amount of fluid is seen over the dome of the liver. 5. Pancreas and aorta not visualized.   Electronically Signed   By: Lajean Manes Solomon.D.   On: 09/10/2013 18:15   US Abdomen Limited  09/15/2013   CLINICAL DATA:  ruq pain  EXAM: US ABDOMEN LIMITED - RIGHT UPPER QUADRANT  COMPARISON:  09/10/2013  FINDINGS: Gallbladder:  2.2 cm stone in the  gallbladder neck. There is borderline wall thickening, 3 mm. Sonographer reports positive sonographic Murphy sign. No pericholecystic fluid is identified.  Common bile duct:  Diameter: 3.8 mm, unremarkable  Liver:  No focal lesion identified. Within normal limits in parenchymal echogenicity.  IMPRESSION: 1. Cholelithiasis with borderline gallbladder wall thickening.   Electronically Signed   By: Arne Cleveland Solomon.D.   On: 09/15/2013 16:06   Dg Chest Port 1 View  09/11/2013   CLINICAL DATA:  Chest tube removal  EXAM: PORTABLE CHEST - 1 VIEW  COMPARISON:  09/11/2013  FINDINGS: Large cardiac silhouette. No airspace disease and effusion at the right lung base unchanged. Interval removal right chest tube No pneumothorax.  IMPRESSION: No change in right lower pneumonia and parapneumonic effusion.  Removal of right chest tube.  No pneumothorax.   Electronically Signed   By: Suzy Bouchard Solomon.D.   On: 09/11/2013 15:33   Dg Chest Port 1 View  09/11/2013   CLINICAL DATA:  Status post drainage of empyema with chest tube placement.  EXAM: PORTABLE CHEST - 1 VIEW  COMPARISON:  Multiple priors.  Also CT chest 09/06/2013.  FINDINGS: RIGHT basilar chest tube remains. Moderate RIGHT pleural fluid is stable. There is no pneumothorax. Central venous catheter tip superior vena cava unchanged. Stable enlarged cardiomediastinal silhouette and stable LEFT hemithorax.  IMPRESSION: Stable chest.   Electronically Signed   By: Rolla Flatten Solomon.D.   On: 09/11/2013 08:32   Dg Chest Port 1 View  09/10/2013   CLINICAL DATA:  Follow-up far ongoing decortication  EXAM: PORTABLE CHEST - 1 VIEW  COMPARISON:  Portable chest x-ray of September 09, 2013  FINDINGS: The left lung is clear. On the right increasing atelectatic change is present. A small amount of pleural fluid persists. The medial upper chest tube and the medial lower chest tube remain. The more lateral upper chest tube is been removed. There is no mediastinal shift. The  cardiopericardial silhouette is mildly enlarged but stable. The right internal jugular venous catheter tip lies in the region of the proximal SVC.  IMPRESSION: There has been slight interval increase in pleural fluid and atelectasis in the right lung since yesterday's study. There is no pneumothorax.   Electronically Signed   By: David  Martinique   On: 09/10/2013 08:06   Dg Chest Port 1 View  09/09/2013   CLINICAL DATA:  Status post VATS  EXAM: PORTABLE CHEST - 1 VIEW  COMPARISON:  09/08/2013  FINDINGS: Cardiac shadow is stable in enlarged. A right jugular line and three right-sided thoracostomy catheters are again seen and stable. No pneumothorax is noted. Left lung remains clear. Right basilar atelectasis/effusion is again noted.  IMPRESSION:  Stable changes on the right.  No new acute abnormality is noted.   Electronically Signed   By: Inez Catalina Solomon.D.   On: 09/09/2013 08:07   Dg Chest Port 1 View  09/08/2013   CLINICAL DATA:  Video assisted thoracoscopy for drainage of empyema.  EXAM: PORTABLE CHEST - 1 VIEW  COMPARISON:  09/07/2013  FINDINGS: Endotracheal tube has its tip 3 cm above the carina. Nasogastric tube enters the abdomen. 3 chest tubes are in place on the right. There is no pneumothorax. Right internal jugular central line has its tip in the SVC above the right atrium. There is worsening volume loss in both lower lobes.  IMPRESSION: Three chest tubes in place on the right. No pneumothorax. Worsening volume loss in both lower lobes.   Electronically Signed   By: Nelson Chimes Solomon.D.   On: 09/08/2013 08:14   Dg Chest Port 1 View  09/07/2013   CLINICAL DATA:  Endotracheal and chest tube placement. Video-assisted thoracoscopy with drainage of empyema and decortication, right chest. Postop day 0  EXAM: PORTABLE CHEST - 1 VIEW  COMPARISON:  09/07/2013  FINDINGS: Endotracheal tube tip: 2.7 cm above the carina. Right IJ line tip: SVC.  Multiple right-sided chest tubes are present. Small right basilar  pneumothorax peripherally. There is fluid in the minor fissure. Reduced atelectasis at the right lung base, with some underlying interstitial and patchy airspace opacity in the right lung.  Suspected mild atelectasis in the left upper lobe. Mildly enlarged cardiopericardial silhouette. Significantly reduced right pleural effusion. Mildly blunted left lateral costophrenic angle.  IMPRESSION: 1. Interval right empyema drainage and decortication, with small right peripheral basilar pneumothorax, significant decrease in right pleural effusion and right basilar atelectasis, and multiple chest tubes in place. There is some patchy interstitial and airspace opacity in the right lung. 2. Mild atelectasis in the left upper lobe. 3. Mildly enlarged cardiopericardial silhouette. 4. Tubes and lines appear satisfactorily positioned.   Electronically Signed   By: Sherryl Barters Solomon.D.   On: 09/07/2013 14:13   Dg Chest Port 1 View  09/06/2013   CLINICAL DATA:  53 year old male with shortness of breath and respiratory distress  EXAM: PORTABLE CHEST - 1 VIEW  COMPARISON:  01/04/1998 chest radiograph  FINDINGS: Pulmonary vascular congestion is noted.  Opacities overlying both lower lungs, right greater than left, probably represents effusion and atelectasis/airspace disease.  A 3 x 4 cm oval structure overlying the right lower lung could represent a focal mass.  No acute bony abnormalities are noted.  There is no evidence of pneumothorax.  IMPRESSION: Bilateral lower lung opacities, right greater than left, likely representing pleural effusions and atelectasis/airspace disease.  3 x 4 cm oval structure overlying the right lower lung could represent focal mass. Radiographic follow-up recommended.  Pulmonary vascular congestion.   Electronically Signed   By: Hassan Rowan Solomon.D.   On: 09/06/2013 20:48      Microbiology: No results found for this or any previous visit (from the past 240 hour(s)).   Labs: Results for orders placed  during the hospital encounter of 09/06/13 (from the past 48 hour(s))  CBC     Status: Abnormal   Collection Time    09/16/13  4:23 AM      Result Value Ref Range   WBC 9.7  4.0 - 10.5 K/uL   RBC 2.37 (*) 4.22 - 5.81 MIL/uL   Hemoglobin 7.9 (*) 13.0 - 17.0 g/dL   HCT 23.3 (*) 39.0 - 52.0 %  MCV 98.3  78.0 - 100.0 fL   MCH 33.3  26.0 - 34.0 pg   MCHC 33.9  30.0 - 36.0 g/dL   RDW 15.2  11.5 - 15.5 %   Platelets 569 (*) 150 - 400 K/uL  COMPREHENSIVE METABOLIC PANEL     Status: Abnormal   Collection Time    09/16/13  4:23 AM      Result Value Ref Range   Sodium 144  137 - 147 mEq/L   Potassium 4.0  3.7 - 5.3 mEq/L   Chloride 106  96 - 112 mEq/L   CO2 27  19 - 32 mEq/L   Glucose, Bld 100 (*) 70 - 99 mg/dL   BUN 11  6 - 23 mg/dL   Creatinine, Ser 1.64 (*) 0.Frank - 1.35 mg/dL   Calcium 8.7  8.4 - 10.5 mg/dL   Total Protein 6.5  6.0 - 8.3 g/dL   Albumin 2.1 (*) 3.5 - 5.2 g/dL   AST 72 (*) 0 - 37 U/L   ALT 34  0 - 53 U/L   Alkaline Phosphatase 95  39 - 117 U/L   Total Bilirubin 0.3  0.3 - 1.2 mg/dL   GFR calc non Af Amer 47 (*) >90 mL/min   GFR calc Af Amer 54 (*) >90 mL/min   Comment: (NOTE)     The eGFR has been calculated using the CKD EPI equation.     This calculation has not been validated in all clinical situations.     eGFR's persistently <90 mL/min signify possible Chronic Kidney     Disease.   Anion gap 11  5 - 15  CBC     Status: Abnormal   Collection Time    09/17/13  3:59 AM      Result Value Ref Range   WBC 9.3  4.0 - 10.5 K/uL   RBC 2.48 (*) 4.22 - 5.81 MIL/uL   Hemoglobin 8.2 (*) 13.0 - 17.0 g/dL   HCT 24.5 (*) 39.0 - 52.0 %   MCV 98.8  78.0 - 100.0 fL   MCH 33.1  26.0 - 34.0 pg   MCHC 33.5  30.0 - 36.0 g/dL   RDW 15.2  11.5 - 15.5 %   Platelets 603 (*) 150 - 400 K/uL    Admit HPI / Brief Narrative:  Frank Solomon w/ Hx Tobacco abuse, CHF, TIA who presented with acute resp failure. Frank Solomon reported sudden onset of CP and SOB over 2-3 days. Denied any trauma. On  presentation to the ER. tmax 99.9, HR 100s-120s, RR- 20s-40, SBP 100s-130s. Satting > 95% on 3L. WBC 33.2, hgb 11, K 3.3, Cr 0.92. Trop and lactate WNL. CT angio of the chest noted 11x12 cm RLL air fluid level w/ presumed necrotic tissue in addition to R loculated empyema with infectious ground glass opacities and reactive mediastinal LAD. + mild mediastinal L shift. CTS was consulted and patient underwent VATS on 8/11 and was extubated on 8/12. He required vasopressors for 1-2 days,now resuming blood pressure medications. Treated with monotherapy zosyn postoperatively.  Also had some melena but GI wants to have outpatient follow up due to pulmonary issues. Hemoglobin stable.  Assessment/Plan  Septic shock and acute hypoxic respiratory failure due to R empyema, off vasopressors  - S/P right VATS - post-op care per TCTS  - abx selection and tx course per TCTS, as per their recommendation, continued zosyn iv for 10 days, then switch to by mouth antibiotics likely Augmentin for another  10 days at discharge  - Body fluid cx growing Strep group F  - Tissue culture with abundant Gram pos rods in pairs and abundant gram-negative rods; speciation and susceptibilities pending  Surgical incisions previously healing without difficulty. Now Mini Thoracotomy incision has area of drainage, purulent drainage present on dressing, + induration Evaluated by cardiothoracic surgery prior to discharge He will call the office if he develops increasing drainage, tenderness or fevers.  Recommend continuation of oral antibiotics at this time and okay to discharge home   RUQ pain, likely due to fluid above dome of liver , pulmonary process  - Mild transaminitis appears about stable, bilirubin stable  - [place] 8/14 with sludge, no evidence of cholecystitis, just fluid above dome of liver which is probably reactive from lung issues.  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 Collene Mares, GI, recommended we continue to monitor the Frank Solomon and consider endoscopic eval in f/u as outpt due to close proximity to his recent VATS  Repeat CBC in one week and follow with gastroenterology  Hypokalemia, resolved this AM     Leukocytosis and thrombocytosis due to empyema, resolving.   AKI, likey due to ATN from septic shock and antibiotic exposure  - Creatinine stable at 1.6 - Minimize nephrotoxins , holding ARB Repeat BMP panel in one week at PCP office and resume ACE inhibitor if indicated   Diet: Healthy heart         Discharge Exam Blood pressure 131/75, pulse 91, temperature 98.6 F (37 C), temperature source Oral, resp. rate 16, height 6' (1.829 Solomon), weight 105.6 kg (232 lb 12.9 oz), SpO2 100.00%.         Discharge Instructions   Diet - low sodium heart healthy    Complete by:  As directed      Increase activity slowly    Complete by:  As directed            Follow-up Information   Follow up with Melrose Nakayama, MD On 09/28/2013. (Appointment is at 4:30)    Specialty:  Cardiothoracic Surgery   Contact information:   [address] [place] [address] [phone]       Follow up with [facility] On 09/28/2013. (Please get CXR at 3:30)    Contact information:   [place]       Follow up with PCP. Schedule an appointment as soon as possible for a visit in 1 week.      SignedReyne Dumas 09/17/2013, 2:11 PM

## 2013-09-17 NOTE — Discharge Instructions (Addendum)
Video-Assisted Thoracic Surgery Care After Refer to this sheet in the next few weeks. These instructions provide you with information on caring for yourself after your procedure. Your caregiver may also give you more specific instructions. Your procedure has been planned according to current medical practices, but problems sometimes occur. Call your caregiver if you have any problems or questions after your procedure. HOME CARE INSTRUCTIONS   Only take over-the-counter or prescription medications as directed.  Only take pain medications (narcotics) as directed.  Do not drive until your caregiver approves. Driving while taking narcotics or soon after surgery can be dangerous, so discuss the specific timing with your caregiver.  Avoid activities that use your chest muscles, such as lifting heavy objects, for at least 3-4 weeks.   Take deep breaths to expand the lungs and to protect against pneumonia.  Do breathing exercises as directed by your caregiver. If you were given an incentive spirometer to help with breathing, use it as directed.  You may resume a normal diet and activities when you feel you are able to or as directed.  Do not take a bath until your caregiver says it is OK. Use the shower instead.   Keep the bandage (dressing) covering the area where the chest tube was inserted (incision site) dry for 48 hours. After 48 hours, remove the dressing unless there is new drainage.  Remove dressings as directed by your caregiver.  Change dressings if necessary or as directed.  Keep all follow-up appointments. It is important for you to see your caregiver after surgery to discuss appropriate follow-up care and surveillance, if it is necessary. SEEK MEDICAL CARE:  You feel excessive or increasing pain at an incision site.  You notice bleeding, skin irritation, drainage, swelling, or redness at an incision site.  There is a bad smell coming from an incision or dressing.  It feels  like your heart is fluttering or beating rapidly.  Your pain medication does not relieve your pain. SEEK IMMEDIATE MEDICAL CARE IF:   You have a fever.   You have chest pain.  You have a rash.  You have shortness of breath.  You have trouble breathing.   You feel weak, lightheaded, dizzy, or faint.  MAKE SURE YOU:   Understand these instructions.   Will watch your condition.   Will get help right away if you are not doing well or get worse. Document Released: 05/11/2012 Document Reviewed: 05/11/2012 [facility] Patient Information 2015 [place]. This information is not intended to replace advice given to you by your health care provider. Make sure you discuss any questions you have with your health care provider. call if he develops increasing drainage, tenderness or fevers   Wound Care:  Wash incision daily with soap and water, pat dry. May cover with bandage if drainage present

## 2013-09-20 ENCOUNTER — Ambulatory Visit (INDEPENDENT_AMBULATORY_CARE_PROVIDER_SITE_OTHER): Payer: Self-pay | Admitting: Surgical

## 2013-09-20 VITALS — BP 135/81 | HR 84 | Temp 98.6°F | Resp 16 | Ht 72.0 in | Wt 215.0 lb

## 2013-09-20 DIAGNOSIS — J869 Pyothorax without fistula: Secondary | ICD-10-CM

## 2013-09-20 DIAGNOSIS — J852 Abscess of lung without pneumonia: Secondary | ICD-10-CM

## 2013-09-20 DIAGNOSIS — Z5189 Encounter for other specified aftercare: Secondary | ICD-10-CM

## 2013-09-20 DIAGNOSIS — Z09 Encounter for follow-up examination after completed treatment for conditions other than malignant neoplasm: Secondary | ICD-10-CM

## 2013-09-20 NOTE — Progress Notes (Signed)
[address]       [place] [address]             [phone]                  Frank Solomon  Medical Record #[id number] Date of Birth: 06/10/1960  Referring AO:[id number], Grace Bushy, MD Primary Cardiology: Primary Care:No primary provider on file.  Chief Complaint:  Follow Up Visit   History of Present Illness:    53 year old black male seen in routine followup after right video-assisted thoracoscopy for drainage of empyema. He had some swelling of his incision at time of discharge on Friday of last week. He is currently on Augmentin for a 14 day course at time of discharge. We're seeing in the office today to recheck the wound. He currently has only minor discomfort and no significant shortness of breath. He has not had any fevers, chills or other constitutional symptoms.         Zubrod Score: At the time of surgery this patient's most appropriate activity status/level should be described as: []     0    Normal activity, no symptoms []     1    Restricted in physical strenuous activity but ambulatory, able to do out light work []     2    Ambulatory and capable of self care, unable to do work activities, up and about                 >50 % of waking hours                                                                                   []     3    Only limited self care, in bed greater than 50% of waking hours []     4    Completely disabled, no self care, confined to bed or chair []     5    Moribund  History  Smoking status  . Current Some Day Smoker -- 0.10 packs/day  . Types: Cigarettes  Smokeless tobacco  . Never Used       No Known Allergies  Current Outpatient Prescriptions  Medication Sig Dispense Refill  . amLODipine (NORVASC) 5 MG tablet Take 5 mg by mouth daily.      Marland Kitchen amoxicillin-clavulanate (AUGMENTIN) 875-125 MG per tablet Take 1 tablet by mouth every 12 (twelve) hours.  28 tablet  0  . Camphor-Menthol-Methyl Sal (BEN GAY ULTRA  STRENGTH) 05-07-28 % CREA Apply 1 application topically as needed (for sore muscles).      . carvedilol (COREG) 12.5 MG tablet Take 12.5 mg by mouth 2 (two) times daily.      . cetirizine (ZYRTEC) 10 MG tablet Take 10 mg by mouth daily.      . furosemide (LASIX) 20 MG tablet Take 1 tablet (20 mg total) by mouth daily.  30 tablet  2  . hydrALAZINE (APRESOLINE) 25 MG tablet Take 25 mg by mouth 2 (two) times daily.      Marland Kitchen oxyCODONE (OXY IR/ROXICODONE) 5 MG immediate release tablet Take 1-2 tablets (5-10 mg total) by mouth every  6 (six) hours as needed for severe pain.  45 tablet  0  . pantoprazole (PROTONIX) 40 MG tablet Take 1 tablet (40 mg total) by mouth 2 (two) times daily.  60 tablet  0  . senna (SENOKOT) 8.6 MG TABS tablet Take 1 tablet (8.6 mg total) by mouth daily as needed for mild constipation (when constipated beyond scheduled dose).  120 each  0   No current facility-administered medications for this visit.       Physical Exam: BP 135/81  Pulse 84  Temp(Src) 98.6 F (37 C) (Oral)  Resp 16  Ht 6' (1.829 m)  Wt 215 lb (97.523 kg)  BMI 29.15 kg/m2  SpO2 97%  General appearance: alert, cooperative and no distress Heart: regular rate and rhythm Lungs: Diminished in the right base Wound: Incisions healing well without evidence of infection. I removed the chest tube sutures they are also healing well, although one of the 3 chest tube sites have some minor serous sanguinous drainage.   Diagnostic Studies & Laboratory data:         Recent Radiology Findings: No results found.    Recent Labs: Lab Results  Component Value Date   WBC 9.3 09/17/2013   HGB 8.2* 09/17/2013   HCT 24.5* 09/17/2013   PLT 603* 09/17/2013   GLUCOSE 100* 09/16/2013   ALT 34 09/16/2013   AST 72* 09/16/2013   NA 144 09/16/2013   K 4.0 09/16/2013   CL 106 09/16/2013   CREATININE 1.64* 09/16/2013   BUN 11 09/16/2013   CO2 27 09/16/2013   INR 1.23 09/08/2013      Assessment / Plan:  The patient is doing  well. His vats incisions are healing well without evidence of infection. He is to continue his current antibiotic regimen to completion. We will see him in the office as previously scheduled on September 1 and when necessary if he has any difficulties.          Daisa Stennis E 09/20/2013 1:20 PM

## 2013-09-20 NOTE — Patient Instructions (Signed)
Complete antibiotic course as previously discussed

## 2013-09-24 ENCOUNTER — Other Ambulatory Visit: Payer: Self-pay | Admitting: Thoracic Surgery (Cardiothoracic Vascular Surgery)

## 2013-09-24 DIAGNOSIS — J852 Abscess of lung without pneumonia: Secondary | ICD-10-CM

## 2013-09-28 ENCOUNTER — Other Ambulatory Visit: Payer: Self-pay | Admitting: *Deleted

## 2013-09-28 ENCOUNTER — Encounter: Payer: Self-pay | Admitting: Thoracic Surgery (Cardiothoracic Vascular Surgery)

## 2013-09-28 ENCOUNTER — Ambulatory Visit (INDEPENDENT_AMBULATORY_CARE_PROVIDER_SITE_OTHER): Payer: Self-pay | Admitting: Thoracic Surgery (Cardiothoracic Vascular Surgery)

## 2013-09-28 ENCOUNTER — Ambulatory Visit
Admission: RE | Admit: 2013-09-28 | Discharge: 2013-09-28 | Disposition: A | Discharge status: Home / Self Care | Payer: Medicare Other | Source: Ambulatory Visit | Attending: Thoracic Surgery (Cardiothoracic Vascular Surgery) | Admitting: Thoracic Surgery (Cardiothoracic Vascular Surgery)

## 2013-09-28 VITALS — BP 139/85 | HR 84 | Resp 16 | Ht 72.0 in | Wt 215.0 lb

## 2013-09-28 DIAGNOSIS — J869 Pyothorax without fistula: Secondary | ICD-10-CM

## 2013-09-28 DIAGNOSIS — G8918 Other acute postprocedural pain: Secondary | ICD-10-CM

## 2013-09-28 DIAGNOSIS — Z09 Encounter for follow-up examination after completed treatment for conditions other than malignant neoplasm: Secondary | ICD-10-CM

## 2013-09-28 DIAGNOSIS — J852 Abscess of lung without pneumonia: Secondary | ICD-10-CM

## 2013-09-28 MED ORDER — OXYCODONE HCL 5 MG PO TABS: 5.0000 mg | ORAL_TABLET | Freq: Four times a day (QID) | ORAL | Status: DC | PRN

## 2013-09-28 NOTE — Progress Notes (Signed)
HPI:  Frank Solomon returns today for scheduled postoperative followup visit.  He is a 53 year old gentleman who presented with a right empyema and underwent a thoracoscopic drainage of empyema and decortication on 09/07/2013. 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 He had extensive purulent fluid in the chest. He was treated with 10 days of intravenous antibiotics, and then discharged on a two-week course of Augmentin.  He does have some incisional pain and paresthesias. His breathing is dramatically improved but not quite to baseline prior to his illness. He has noted swelling in both ankles since his surgery and is currently taking Lasix for that.  Past Medical History  Diagnosis Date  . TIA (transient ischemic attack)   . CHF (congestive heart failure)   . 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 amLODipine (NORVASC) 5 MG tablet Take 5 mg by mouth daily.      Marland Kitchen amoxicillin-clavulanate (AUGMENTIN) 875-125 MG per tablet Take 1 tablet by mouth every 12 (twelve) hours.  28 tablet  0  . Camphor-Menthol-Methyl Sal (BEN GAY ULTRA STRENGTH) 05-07-28 % CREA Apply 1 application topically as needed (for sore muscles).      . carvedilol (COREG) 12.5 MG tablet Take 12.5 mg by mouth 2 (two) times daily.      . cetirizine (ZYRTEC) 10 MG tablet Take 10 mg by mouth daily.      . furosemide (LASIX) 20 MG tablet Take 1 tablet (20 mg total) by mouth daily.  30 tablet  2  . hydrALAZINE (APRESOLINE) 25 MG tablet Take 25 mg by mouth 2 (two) times daily.      . pantoprazole (PROTONIX) 40 MG tablet Take 1 tablet (40 mg total) by mouth 2 (two) times daily.  60 tablet  0  . senna (SENOKOT) 8.6 MG TABS tablet Take 1 tablet (8.6 mg total) by mouth daily as needed for mild constipation (when constipated beyond scheduled dose).  120 each  0  . oxyCODONE (OXY IR/ROXICODONE) 5 MG immediate  release tablet Take 1-2 tablets (5-10 mg total) by mouth every 6 (six) hours as needed for severe pain.  45 tablet  0   No current facility-administered medications for this visit.    Physical Exam BP 139/85  Pulse 84  Resp 16  Ht 6' (1.829 m)  Wt 215 lb (97.523 kg)  BMI 29.15 kg/m2  SpO21 52% 53 year old man in no acute distress Alert and oriented x3 with no focal deficits Incisions healing well Diminished breath sounds right base lungs otherwise clear Cardiac regular rate and rhythm  Diagnostic Tests: CHEST 2 VIEW  COMPARISON: PA and lateral chest 09/16/2013 and 09/12/2013.  FINDINGS:  Small to moderate right pleural effusion persists without change.  Streaky opacities in the right lung base have improved. The left  lung is clear. Heart size is normal. No pneumothorax.  IMPRESSION:  No change in a small to moderate right pleural effusion. Streaky  opacity right lung base have improved. No new abnormality.  Electronically Signed  By: Inge Rise M.D.  On: 09/28/2013 16:13         Impression: 53 year old gentleman who is now about 3 weeks post thoracoscopic drainage of empyema and decortication for a right empyema. He is doing very well at this point in time all things considered. I advised him to finish his antibiotics. I emphasized to him  that it was important for him to call early if he develops recurrent fevers, chills, shortness of breath or pleuritic chest pain.  I suspect his ankle swelling is just due to fluid retention following major infection with sepsis and surgery. It should return to baseline with time. He has no signs or symptoms of DVT and swelling is symmetrical bilaterally.  I very pleased with his result. His chest x-ray shows a small residual effusion, but looks remarkably good considering the severity of his infection.  I gave him a prescription for an additional 40 oxycodone tablets to use for pain. He should not drive until he is no longer taking  the oxycodone. After that he can begin driving, appropriate precautions were discussed. His activities are otherwise unrestricted.  Plan: I'll be happy to see Frank Solomon back any time in the future if I can be of any further assistance with his care

## 2014-07-19 ENCOUNTER — Emergency Department (HOSPITAL_COMMUNITY): Payer: Medicare Other

## 2014-07-19 ENCOUNTER — Encounter (HOSPITAL_COMMUNITY): Payer: Self-pay

## 2014-07-19 ENCOUNTER — Emergency Department (HOSPITAL_COMMUNITY)
Admission: EM | Admit: 2014-07-19 | Discharge: 2014-07-19 | Disposition: A | Discharge status: Home / Self Care | Payer: Medicare Other | Attending: Emergency Medicine | Admitting: Emergency Medicine

## 2014-07-19 DIAGNOSIS — Y9289 Other specified places as the place of occurrence of the external cause: Secondary | ICD-10-CM | POA: Diagnosis not present

## 2014-07-19 DIAGNOSIS — Z79899 Other long term (current) drug therapy: Secondary | ICD-10-CM | POA: Diagnosis not present

## 2014-07-19 DIAGNOSIS — Y9389 Activity, other specified: Secondary | ICD-10-CM | POA: Diagnosis not present

## 2014-07-19 DIAGNOSIS — Z8673 Personal history of transient ischemic attack (TIA), and cerebral infarction without residual deficits: Secondary | ICD-10-CM | POA: Diagnosis not present

## 2014-07-19 DIAGNOSIS — I509 Heart failure, unspecified: Secondary | ICD-10-CM | POA: Diagnosis not present

## 2014-07-19 DIAGNOSIS — X58XXXA Exposure to other specified factors, initial encounter: Secondary | ICD-10-CM | POA: Insufficient documentation

## 2014-07-19 DIAGNOSIS — M5416 Radiculopathy, lumbar region: Secondary | ICD-10-CM | POA: Insufficient documentation

## 2014-07-19 DIAGNOSIS — S79911A Unspecified injury of right hip, initial encounter: Secondary | ICD-10-CM | POA: Insufficient documentation

## 2014-07-19 DIAGNOSIS — Z720 Tobacco use: Secondary | ICD-10-CM | POA: Insufficient documentation

## 2014-07-19 DIAGNOSIS — M25551 Pain in right hip: Secondary | ICD-10-CM

## 2014-07-19 DIAGNOSIS — Y998 Other external cause status: Secondary | ICD-10-CM | POA: Insufficient documentation

## 2014-07-19 DIAGNOSIS — S3992XA Unspecified injury of lower back, initial encounter: Secondary | ICD-10-CM | POA: Diagnosis present

## 2014-07-19 DIAGNOSIS — R52 Pain, unspecified: Secondary | ICD-10-CM

## 2014-07-19 MED ORDER — PREDNISONE 50 MG PO TABS: 50.0000 mg | ORAL_TABLET | Freq: Every day | ORAL | Status: DC

## 2014-07-19 MED ORDER — HYDROCODONE-ACETAMINOPHEN 5-325 MG PO TABS: 1.0000 | ORAL_TABLET | Freq: Four times a day (QID) | ORAL | Status: DC | PRN

## 2014-07-19 MED ORDER — KETOROLAC TROMETHAMINE 60 MG/2ML IM SOLN
60.0000 mg | Freq: Once | INTRAMUSCULAR | Status: AC
  Administered 2014-07-19: 60 mg via INTRAMUSCULAR
  Filled 2014-07-19: qty 2

## 2014-07-19 NOTE — ED Notes (Signed)
Patient transported to X-ray 

## 2014-07-19 NOTE — ED Provider Notes (Signed)
CSN: [id number]     Arrival date & time 07/19/14  0705 History   First MD Initiated Contact with Patient 07/19/14 [phone]     Chief Complaint  Patient presents with  . Leg Pain     (Consider location/radiation/quality/duration/timing/severity/associated sxs/prior Treatment) HPI Patient presents to the emergency department with right hip and lower back pain.  The patient states he was lifting some objects when he felt a crunch-like sensation in his hip and buttock.  The patient states that movement and walking makes the pain worse.  The patient states he took Tylenol without significant relief of his symptoms.  The patient states that he does not have any chest pain, shortness of breath, nausea, vomiting, weakness, dizziness, headache, blurred vision, neck pain, fever, dysuria, incontinence, bloody stool or syncope Past Medical History  Diagnosis Date  . TIA (transient ischemic attack)   . CHF (congestive heart failure)   . 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 Video assisted thoracoscopy (vats)/decortication Right 09/07/2013    Procedure: VIDEO ASSISTED THORACOSCOPY (VATS)/ DRAINAGE OF EMPYEMA/ DECORTICATION;  Surgeon: Melrose Nakayama, MD;  Location: [facility];  Service: Thoracic;  Laterality: Right;   Family History  Problem Relation Age of Onset  . Dementia Mother   . Diabetes Brother    History  Substance Use Topics  . Smoking status: Current Some Day Smoker -- 0.10 packs/day    Types: Cigarettes  . Smokeless tobacco: Never Used  . Alcohol Use: No    Review of Systems All other systems negative except as documented in the HPI. All pertinent positives and negatives as reviewed in the HPI.   Allergies  Review of patient's allergies indicates no known allergies.  Home Medications   Prior to Admission medications   Medication Sig Start Date End Date Taking? Authorizing Provider  amLODipine  (NORVASC) 5 MG tablet Take 5 mg by mouth daily. 08/29/13   Historical Provider, MD  Camphor-Menthol-Methyl Sal (BEN GAY ULTRA STRENGTH) 05-07-28 % CREA Apply 1 application topically as needed (for sore muscles).    Historical Provider, MD  carvedilol (COREG) 12.5 MG tablet Take 12.5 mg by mouth 2 (two) times daily. 07/29/13   Historical Provider, MD  cetirizine (ZYRTEC) 10 MG tablet Take 10 mg by mouth daily.    Historical Provider, MD  furosemide (LASIX) 20 MG tablet Take 1 tablet (20 mg total) by mouth daily. 09/17/13   Reyne Dumas, MD  hydrALAZINE (APRESOLINE) 25 MG tablet Take 25 mg by mouth 2 (two) times daily. 08/30/13   Historical Provider, MD  oxyCODONE (OXY IR/ROXICODONE) 5 MG immediate release tablet Take 1-2 tablets (5-10 mg total) by mouth every 6 (six) hours as needed for severe pain. 09/28/13   Melrose Nakayama, MD  pantoprazole (PROTONIX) 40 MG tablet Take 1 tablet (40 mg total) by mouth 2 (two) times daily. 09/17/13   Reyne Dumas, MD  quinapril (ACCUPRIL) 40 MG tablet Take 40 mg by mouth daily. 07/02/14   Historical Provider, MD  senna (SENOKOT) 8.6 MG TABS tablet Take 1 tablet (8.6 mg total) by mouth daily as needed for mild constipation (when constipated beyond scheduled dose). 09/17/13   Reyne Dumas, MD   BP 136/79 mmHg  Pulse 75  Temp(Src) 98 F (36.7 C) (Oral)  Resp 18  Ht 6' (1.829 m)  Wt 225 lb (102.059 kg)  BMI 30.51 kg/m2  SpO2 100% Physical Exam  Constitutional:  He is oriented to person, place, and time. He appears well-developed and well-nourished. No distress.  HENT:  Head: Normocephalic and atraumatic.  Eyes: Pupils are equal, round, and reactive to light.  Cardiovascular: Normal rate, regular rhythm and normal heart sounds.  Exam reveals no gallop and no friction rub.   No murmur heard. Pulmonary/Chest: Effort normal and breath sounds normal. No respiratory distress.  Musculoskeletal:       Back:       Legs: Neurological: He is alert and oriented to person,  place, and time. He has normal strength. No cranial nerve deficit or sensory deficit. He exhibits normal muscle tone. Coordination and gait normal. GCS eye subscore is 4. GCS verbal subscore is 5. GCS motor subscore is 6.  Reflex Scores:      Patellar reflexes are 2+ on the right side and 2+ on the left side.      Achilles reflexes are 2+ on the right side and 2+ on the left side. Skin: Skin is warm and dry. No rash noted. No erythema.  Nursing note and vitals reviewed.   ED Course  Procedures (including critical care time) Labs Review Labs Reviewed - No data to display  Imaging Review Dg Lumbar Spine Complete  07/19/2014   CLINICAL DATA:  Low back pain  EXAM: LUMBAR SPINE - COMPLETE 4+ VIEW  COMPARISON:  None.  FINDINGS: Normal lumbar alignment with straightening of the lumbar lordosis. Negative for fracture or pars defect.  Mild disc space narrowing L4-5. Remaining disc spaces intact. SI joints normal.  IMPRESSION: Mild disc degeneration L4-5.  No acute bony abnormality.   Electronically Signed   By: Franchot Gallo M.D.   On: 07/19/2014 09:07   Dg Hip Unilat With Pelvis 2-3 Views Right  07/19/2014   CLINICAL DATA:  Acute onset pain while bending. Now with right lower extremity numbness.  EXAM: RIGHT HIP (WITH PELVIS) 2-3 VIEWS  COMPARISON:  None.  FINDINGS: Frontal pelvis as well as frontal and lateral right hip images obtained. There is no demonstrable fracture or dislocation. Joint spaces appear intact. No erosive change.  IMPRESSION: No fracture or dislocation.  No appreciable arthropathy.   Electronically Signed   By: Lowella Grip III M.D.   On: 07/19/2014 08:55   The patient most likely has sciatica based on his history of present illness and physical exam findings, the patient is having some lower back pain that radiates into his buttocks and hip.  The hip may be causing him pain as well, but there is some indication that there is a radicular component to this.  The patient has  normal neurological function and motor strength in the extremities.  Patient does not have any signs of significant cord compression      Dalia Heading, PA-C 07/19/14 [place], MD 07/19/14 2241

## 2014-07-19 NOTE — ED Notes (Signed)
Pt. States last Thursday he was picking up boxes when he felt a "crunch in his leg." has had worsening pain since in the R hip area radiating around the anterior thigh. Pt. States it is a sharp stabbing pain. States ambulation is difficult. States that he has been using warm compresses and extra strength tylenol with no relief.

## 2014-07-19 NOTE — Discharge Instructions (Signed)
Return here as needed.  Follow-up with the clinic provided.  Use ice and heat on your lower back.  The x-rays did not show any significant abnormalities other than some degenerative changes in your lower back. which could contribute to this type of issue

## 2014-08-02 ENCOUNTER — Emergency Department (HOSPITAL_COMMUNITY)
Admission: EM | Admit: 2014-08-02 | Discharge: 2014-08-02 | Disposition: A | Discharge status: Home / Self Care | Payer: Medicare Other | Attending: Emergency Medicine | Admitting: Emergency Medicine

## 2014-08-02 ENCOUNTER — Encounter (HOSPITAL_COMMUNITY): Payer: Self-pay | Admitting: Emergency Medicine

## 2014-08-02 DIAGNOSIS — Z79899 Other long term (current) drug therapy: Secondary | ICD-10-CM | POA: Insufficient documentation

## 2014-08-02 DIAGNOSIS — M79604 Pain in right leg: Secondary | ICD-10-CM | POA: Insufficient documentation

## 2014-08-02 DIAGNOSIS — Z8673 Personal history of transient ischemic attack (TIA), and cerebral infarction without residual deficits: Secondary | ICD-10-CM | POA: Diagnosis not present

## 2014-08-02 DIAGNOSIS — M5441 Lumbago with sciatica, right side: Secondary | ICD-10-CM | POA: Diagnosis not present

## 2014-08-02 DIAGNOSIS — M79609 Pain in unspecified limb: Secondary | ICD-10-CM

## 2014-08-02 DIAGNOSIS — M25551 Pain in right hip: Secondary | ICD-10-CM | POA: Diagnosis not present

## 2014-08-02 DIAGNOSIS — M545 Low back pain: Secondary | ICD-10-CM | POA: Diagnosis present

## 2014-08-02 DIAGNOSIS — Z7952 Long term (current) use of systemic steroids: Secondary | ICD-10-CM | POA: Insufficient documentation

## 2014-08-02 DIAGNOSIS — I509 Heart failure, unspecified: Secondary | ICD-10-CM | POA: Insufficient documentation

## 2014-08-02 DIAGNOSIS — R202 Paresthesia of skin: Secondary | ICD-10-CM | POA: Diagnosis not present

## 2014-08-02 DIAGNOSIS — Z720 Tobacco use: Secondary | ICD-10-CM | POA: Diagnosis not present

## 2014-08-02 MED ORDER — NAPROXEN 500 MG PO TABS: 500.0000 mg | ORAL_TABLET | Freq: Two times a day (BID) | ORAL | Status: DC | PRN

## 2014-08-02 MED ORDER — OXYCODONE-ACETAMINOPHEN 5-325 MG PO TABS: 1.0000 | ORAL_TABLET | Freq: Four times a day (QID) | ORAL | Status: DC | PRN

## 2014-08-02 MED ORDER — CYCLOBENZAPRINE HCL 10 MG PO TABS: 10.0000 mg | ORAL_TABLET | Freq: Three times a day (TID) | ORAL | Status: DC | PRN

## 2014-08-02 NOTE — ED Provider Notes (Signed)
CSN: [id number]     Arrival date & time 08/02/14  1233 History  This chart was scribed for non-physician practitioner, Zacarias Pontes, PA-C, working with Carmin Muskrat, MD by Ladene Artist, ED Scribe. This patient was seen in room TR10C/TR10C and the patient's care was started 2:10 PM.   Chief Complaint  Patient presents with  . Leg Pain   Patient is a 54 y.o. male presenting with leg pain. The history is provided by the patient. No language interpreter was used.  Leg Pain Location:  Leg and hip Time since incident:  2 weeks Hip location:  R hip Leg location:  R leg Pain details:    Quality:  Sharp   Radiates to:  Groin and R leg   Severity:  Moderate   Onset quality:  Gradual   Duration:  2 weeks   Timing:  Constant   Progression:  Worsening Relieved by:  Rest Worsened by:  Bearing weight Ineffective treatments:  Acetaminophen Associated symptoms: back pain and tingling   Associated symptoms: no fever, no muscle weakness and no numbness    HPI Comments: Frank Solomon is a 54 y.o. male who presents to the Emergency Department complaining of persistent right low back pain for the past 2 weeks. Pt initially injured his back on 07/19/14 while lifting a heavy box and has had pain since. Pt states that pain is constant in his right leg but radiates from his lower back into his right hip when standing. He currently describes pain as a 5/10 sharp sensation that is exacerbated with standing, ambulating and lying. Pt also reports a constant tingling sensation in his right 1st-3rd toes. He has tried Norco, Prednisone, aspirin, and Tylenol without relief. Pt denies fever, chills, HA, visual disturbances, chest pain, SOB, abdominal pain, nausea, vomiting, dysuria, hematuria, flank pain, numbness, weakness, bladder or bowel incontinence.   Past Medical History  Diagnosis Date  . TIA (transient ischemic attack)   . CHF (congestive heart failure)   . Cardiomyopathy, dilated     Diagnosed  1999 had biopsy and also VT   Past Surgical History  Procedure Laterality Date  . Video assisted thoracoscopy (vats)/decortication Right 09/07/2013    Procedure: VIDEO ASSISTED THORACOSCOPY (VATS)/ DRAINAGE OF EMPYEMA/ DECORTICATION;  Surgeon: Melrose Nakayama, MD;  Location: [place];  Service: Thoracic;  Laterality: Right;   Family History  Problem Relation Age of Onset  . Dementia Mother   . Diabetes Brother    History  Substance Use Topics  . Smoking status: Current Some Day Smoker -- 0.10 packs/day    Types: Cigarettes  . Smokeless tobacco: Never Used  . Alcohol Use: No    Review of Systems  Constitutional: Negative for fever and chills.  Eyes: Negative for visual disturbance.  Respiratory: Negative for shortness of breath.   Cardiovascular: Negative for chest pain.  Gastrointestinal: Negative for nausea, vomiting, abdominal pain and diarrhea.  Genitourinary: Negative for dysuria, hematuria and flank pain.  Musculoskeletal: Positive for myalgias and back pain.  Neurological: Negative for weakness, numbness and headaches.   10 Systems reviewed and all are negative for acute change except as noted in the HPI.  Allergies  Review of patient's allergies indicates no known allergies.  Home Medications   Prior to Admission medications   Medication Sig Start Date End Date Taking? Authorizing Provider  acetaminophen (TYLENOL) 500 MG tablet Take 500 mg by mouth every 6 (six) hours as needed for mild pain.    Historical Provider, MD  amLODipine ([place])  5 MG tablet Take 5 mg by mouth daily. 08/29/13   Historical Provider, MD  Camphor-Menthol-Methyl Sal (BEN GAY ULTRA STRENGTH) 05-07-28 % CREA Apply 1 application topically as needed (for sore muscles).    Historical Provider, MD  carvedilol (COREG) 12.5 MG tablet Take 12.5 mg by mouth 2 (two) times daily. 07/29/13   Historical Provider, MD  cetirizine (ZYRTEC) 10 MG tablet Take 10 mg by mouth daily.    Historical Provider, MD   furosemide (LASIX) 20 MG tablet Take 1 tablet (20 mg total) by mouth daily. 09/17/13   Reyne Dumas, MD  hydrALAZINE (APRESOLINE) 25 MG tablet Take 25 mg by mouth 2 (two) times daily. 08/30/13   Historical Provider, MD  HYDROcodone-acetaminophen (NORCO/VICODIN) 5-325 MG per tablet Take 1 tablet by mouth every 6 (six) hours as needed for moderate pain. 07/19/14   Dalia Heading, PA-C  oxyCODONE (OXY IR/ROXICODONE) 5 MG immediate release tablet Take 1-2 tablets (5-10 mg total) by mouth every 6 (six) hours as needed for severe pain. Patient not taking: Reported on 07/19/2014 09/28/13   Melrose Nakayama, MD  pantoprazole (PROTONIX) 40 MG tablet Take 1 tablet (40 mg total) by mouth 2 (two) times daily. Patient not taking: Reported on 07/19/2014 09/17/13   Reyne Dumas, MD  predniSONE (DELTASONE) 50 MG tablet Take 1 tablet (50 mg total) by mouth daily. 07/19/14   Dalia Heading, PA-C  quinapril (ACCUPRIL) 40 MG tablet Take 40 mg by mouth daily. 07/02/14   Historical Provider, MD  senna (SENOKOT) 8.6 MG TABS tablet Take 1 tablet (8.6 mg total) by mouth daily as needed for mild constipation (when constipated beyond scheduled dose). Patient not taking: Reported on 07/19/2014 09/17/13   Reyne Dumas, MD   BP 145/89 mmHg  Pulse 82  Temp(Src) 98.2 F (36.8 C) (Oral)  Resp 18  Ht 6' (1.829 m)  Wt 225 lb (102.059 kg)  BMI 30.51 kg/m2  SpO2 99% Physical Exam  Constitutional: He is oriented to person, place, and time. Vital signs are normal. He appears well-developed and well-nourished.  Non-toxic appearance. No distress.  Afebrile, nontoxic, NAD  HENT:  Head: Normocephalic and atraumatic.  Mouth/Throat: Mucous membranes are normal.  Eyes: Conjunctivae and EOM are normal. Right eye exhibits no discharge. Left eye exhibits no discharge.  Neck: Normal range of motion. Neck supple.  Cardiovascular: Normal rate and intact distal pulses.   Pulmonary/Chest: Effort normal. No respiratory distress.   Abdominal: Normal appearance. He exhibits no distension.  Musculoskeletal: Normal range of motion.       Lumbar back: He exhibits tenderness and spasm. He exhibits normal range of motion, no bony tenderness and no deformity.       Back:  Lumbar spine with FROM intact without spinous process TTP, no bony stepoffs or deformities, with mild right-sided paraspinous muscle TTP and muscle spasms. Strength 5/5 in all extremities, sensation grossly intact in all extremities, + SLR of R side, gait steady and nonantalgic. No overlying skin changes.   Neurological: He is alert and oriented to person, place, and time. He has normal strength. No sensory deficit.  Skin: Skin is warm, dry and intact. No rash noted.  Psychiatric: He has a normal mood and affect.  Nursing note and vitals reviewed.  ED Course  Procedures (including critical care time) DIAGNOSTIC STUDIES: Oxygen Saturation is 99% on RA, normal by my interpretation.    COORDINATION OF CARE: 2:16 PM-Discussed treatment plan which includes Flexeril, Naproxen and Percocet with pt at bedside and pt agreed  to plan.   Labs Review Labs Reviewed - No data to display  Imaging Review No results found.  Dg Lumbar Spine Complete  07/19/2014   CLINICAL DATA:  Low back pain  EXAM: LUMBAR SPINE - COMPLETE 4+ VIEW  COMPARISON:  None.  FINDINGS: Normal lumbar alignment with straightening of the lumbar lordosis. Negative for fracture or pars defect.  Mild disc space narrowing L4-5. Remaining disc spaces intact. SI joints normal.  IMPRESSION: Mild disc degeneration L4-5.  No acute bony abnormality.   Electronically Signed   By: Franchot Gallo M.D.   On: 07/19/2014 09:07   Dg Hip Unilat With Pelvis 2-3 Views Right  07/19/2014   CLINICAL DATA:  Acute onset pain while bending. Now with right lower extremity numbness.  EXAM: RIGHT HIP (WITH PELVIS) 2-3 VIEWS  COMPARISON:  None.  FINDINGS: Frontal pelvis as well as frontal and lateral right hip images obtained.  There is no demonstrable fracture or dislocation. Joint spaces appear intact. No erosive change.  IMPRESSION: No fracture or dislocation.  No appreciable arthropathy.   Electronically Signed   By: Lowella Grip III M.D.   On: 07/19/2014 08:55    EKG Interpretation None      MDM   Final diagnoses:  Right-sided low back pain with right-sided sciatica  Right hip pain  Paresthesia and pain of right extremity    54 y.o. male here with ongoing back pain. No red flag s/s of low back pain. No s/s of central cord compression or cauda equina. Lower extremities are neurovascularly intact and patient is ambulating without difficulty. +SLR and story c/w sciatica. Xrays already obtained at last visit and were negative. Likely disc issue, will have him f/up with orthopedist and escalate tx plan using heat, percocet/naprosyn, and flexeril. Since no improvement with prednisone, doubt need for repeating this.   Patient was counseled on back pain precautions and told to do activity as tolerated but do not lift, push, or pull heavy objects more than 10 pounds for the next week. Patient counseled to use ice or heat on back for no longer than 15 minutes every hour.   Patient urged to follow-up with orthopedist if pain does not improve with treatment and rest or if pain becomes recurrent. Urged to return with worsening severe pain, loss of bowel or bladder control, trouble walking. The patient verbalizes understanding and agrees with the plan.   I personally performed the services described in this documentation, which was scribed in my presence. The recorded information has been reviewed and is accurate.  BP 145/89 mmHg  Pulse 82  Temp(Src) 98.2 F (36.8 C) (Oral)  Resp 18  Ht 6' (1.829 m)  Wt 225 lb (102.059 kg)  BMI 30.51 kg/m2  SpO2 99%  Meds ordered this encounter  Medications  . cyclobenzaprine (FLEXERIL) 10 MG tablet    Sig: Take 1 tablet (10 mg total) by mouth 3 (three) times daily as needed  for muscle spasms.    Dispense:  15 tablet    Refill:  0    Order Specific Question:  Supervising Provider    Answer:  MILLER, BRIAN [[id number]]  . naproxen (NAPROSYN) 500 MG tablet    Sig: Take 1 tablet (500 mg total) by mouth 2 (two) times daily as needed for mild pain, moderate pain or headache (TAKE WITH MEALS.).    Dispense:  20 tablet    Refill:  0    Order Specific Question:  Supervising Provider    Answer:  [place], [place]  . oxyCODONE-acetaminophen (PERCOCET) 5-325 MG per tablet    Sig: Take 1 tablet by mouth every 6 (six) hours as needed for severe pain.    Dispense:  10 tablet    Refill:  0    Order Specific Question:  Supervising Provider    Answer:  Noemi Chapel [[id number]]       [place] Dani Camprubi-Soms, PA-C 08/02/14 [place], MD 08/03/14 1606

## 2014-08-02 NOTE — ED Notes (Signed)
Pt states that he was seen on 07/19/2014 for right leg pain pt states that for the past week he has developed numbness to his right foot and when he stands up the numbness moves to his calf. Pt is able to walk but he states that his pain gets worse

## 2014-08-02 NOTE — Discharge Instructions (Signed)
Back Pain: Your back pain should be treated with medicines such as ibuprofen or aleve and this back pain should get better over the next 2 weeks.  However if you develop severe or worsening pain, low back pain with fever, numbness, weakness or inability to walk or urinate, you should return to the ER immediately.  Please follow up with your doctor this week for a recheck if still having symptoms. Avoid heavy lifting or twisting.  Low back pain is discomfort in the lower back that may be due to injuries to muscles and ligaments around the spine.  Occasionally, it may be caused by a a problem to a part of the spine called a disc.  The pain may last several days or a week;  However, most patients get completely well in 4 weeks.  Self - care:  The application of heat can help soothe the pain.  Maintaining your daily activities, including walking, is encourged, as it will help you get better faster than just staying in bed. Perform gentle stretching as discussed. Drink plenty of fluids.  Medications are also useful to help with pain control.  A commonly prescribed medication includes percocet.  Do not drive or operate heavy machinery while taking this medication.  Non steroidal anti inflammatory medications including Ibuprofen and naproxen;  These medications help both pain and swelling and are very useful in treating back pain.  They should be taken with food, as they can cause stomach upset, and more seriously, stomach bleeding.    Muscle relaxants:  These medications can help with muscle tightness that is a cause of lower back pain.  Most of these medications can cause drowsiness, and it is not safe to drive or use dangerous machinery while taking them.  SEEK IMMEDIATE MEDICAL ATTENTION IF: New numbness, tingling, weakness, or problem with the use of your arms or legs.  Severe back pain not relieved with medications.  Difficulty with or loss of control of your bowel or bladder control.  Increasing  pain in any areas of the body (such as chest or abdominal pain).  Shortness of breath, dizziness or fainting.  Nausea (feeling sick to your stomach), vomiting, fever, or sweats.  You will need to follow up with the orthopedist in 1-2 weeks for reassessment.   Back Pain, Adult Back pain is very common. The pain often gets better over time. The cause of back pain is usually not dangerous. Most people can learn to manage their back pain on their own.  HOME CARE   Stay active. Start with short walks on flat ground if you can. Try to walk farther each day.  Do not sit, drive, or stand in one place for more than 30 minutes. Do not stay in bed.  Do not avoid exercise or work. Activity can help your back heal faster.  Be careful when you bend or lift an object. Bend at your knees, keep the object close to you, and do not twist.  Sleep on a firm mattress. Lie on your side, and bend your knees. If you lie on your back, put a pillow under your knees.  Only take medicines as told by your doctor.  Put ice on the injured area.  Put ice in a plastic bag.  Place a towel between your skin and the bag.  Leave the ice on for 15-20 minutes, 03-04 times a day for the first 2 to 3 days. After that, you can switch between ice and heat packs.  Ask your  doctor about back exercises or massage.  Avoid feeling anxious or stressed. Find good ways to deal with stress, such as exercise. GET HELP RIGHT AWAY IF:   Your pain does not go away with rest or medicine.  Your pain does not go away in 1 week.  You have new problems.  You do not feel well.  The pain spreads into your legs.  You cannot control when you poop (bowel movement) or pee (urinate).  Your arms or legs feel weak or lose feeling (numbness).  You feel sick to your stomach (nauseous) or throw up (vomit).  You have belly (abdominal) pain.  You feel like you may pass out (faint). MAKE SURE YOU:   Understand these  instructions.  Will watch your condition.  Will get help right away if you are not doing well or get worse. Document Released: 07/03/2007 Document Revised: 04/08/2011 Document Reviewed: 05/18/2013 [facility] Patient Information 2015 [place], [place]. This information is not intended to replace advice given to you by your health care provider. Make sure you discuss any questions you have with your health care provider.  Back Exercises Back exercises help treat and prevent back injuries. The goal is to increase your strength in your belly (abdominal) and back muscles. These exercises can also help with flexibility. Start these exercises when told by your doctor. HOME CARE Back exercises include: Pelvic Tilt.  Lie on your back with your knees bent. Tilt your pelvis until the lower part of your back is against the floor. Hold this position 5 to 10 sec. Repeat this exercise 5 to 10 times. Knee to Chest.  Pull 1 knee up against your chest and hold for 20 to 30 seconds. Repeat this with the other knee. This may be done with the other leg straight or bent, whichever feels better. Then, pull both knees up against your chest. Sit-Ups or Curl-Ups.  Bend your knees 90 degrees. Start with tilting your pelvis, and do a partial, slow sit-up. Only lift your upper half 30 to 45 degrees off the floor. Take at least 2 to 3 seonds for each sit-up. Do not do sit-ups with your knees out straight. If partial sit-ups are difficult, simply do the above but with only tightening your belly (abdominal) muscles and holding it as told. Hip-Lift.  Lie on your back with your knees flexed 90 degrees. Push down with your feet and shoulders as you raise your hips 2 inches off the floor. Hold for 10 seconds, repeat 5 to 10 times. Back Arches.  Lie on your stomach. Prop yourself up on bent elbows. Slowly press on your hands, causing an arch in your low back. Repeat 3 to 5 times. Shoulder-Lifts.  Lie face down with arms beside  your body. Keep hips and belly pressed to floor as you slowly lift your head and shoulders off the floor. Do not overdo your exercises. Be careful in the beginning. Exercises may cause you some mild back discomfort. If the pain lasts for more than 15 minutes, stop the exercises until you see your doctor. Improvement with exercise for back problems is slow.  Document Released: 02/16/2010 Document Revised: 04/08/2011 Document Reviewed: 11/15/2010 [facility] Patient Information 2015 [place], [place]. This information is not intended to replace advice given to you by your health care provider. Make sure you discuss any questions you have with your health care provider.  Heat Therapy Heat therapy can help ease sore, stiff, injured, and tight muscles and joints. Heat relaxes your muscles, which may  help ease your pain.  RISKS AND COMPLICATIONS If you have any of the following conditions, do not use heat therapy unless your health care provider has approved:  Poor circulation.  Healing wounds or scarred skin in the area being treated.  Diabetes, heart disease, or high blood pressure.  Not being able to feel (numbness) the area being treated.  Unusual swelling of the area being treated.  Active infections.  Blood clots.  Cancer.  Inability to communicate pain. This may include young children and people who have problems with their brain function (dementia).  Pregnancy. Heat therapy should only be used on old, pre-existing, or long-lasting (chronic) injuries. Do not use heat therapy on new injuries unless directed by your health care provider. HOW TO USE HEAT THERAPY There are several different kinds of heat therapy, including:  Moist heat pack.  Warm water bath.  Hot water bottle.  Electric heating pad.  Heated gel pack.  Heated wrap.  Electric heating pad. Use the heat therapy method suggested by your health care provider. Follow your health care provider's instructions on when  and how to use heat therapy. GENERAL HEAT THERAPY RECOMMENDATIONS  Do not sleep while using heat therapy. Only use heat therapy while you are awake.  Your skin may turn pink while using heat therapy. Do not use heat therapy if your skin turns red.  Do not use heat therapy if you have new pain.  High heat or long exposure to heat can cause burns. Be careful when using heat therapy to avoid burning your skin.  Do not use heat therapy on areas of your skin that are already irritated, such as with a rash or sunburn. SEEK MEDICAL CARE IF:  You have blisters, redness, swelling, or numbness.  You have new pain.  Your pain is worse. MAKE SURE YOU:  Understand these instructions.  Will watch your condition.  Will get help right away if you are not doing well or get worse. Document Released: 04/08/2011 Document Revised: 05/31/2013 Document Reviewed: 03/09/2013 [facility] Patient Information 2015 [place], [place]. This information is not intended to replace advice given to you by your health care provider. Make sure you discuss any questions you have with your health care provider.  Sciatica Sciatica is pain, weakness, numbness, or tingling along the path of the sciatic nerve. The nerve starts in the lower back and runs down the back of each leg. The nerve controls the muscles in the lower leg and in the back of the knee, while also providing sensation to the back of the thigh, lower leg, and the sole of your foot. Sciatica is a symptom of another medical condition. For instance, nerve damage or certain conditions, such as a herniated disk or bone spur on the spine, pinch or put pressure on the sciatic nerve. This causes the pain, weakness, or other sensations normally associated with sciatica. Generally, sciatica only affects one side of the body. CAUSES   Herniated or slipped disc.  Degenerative disk disease.  A pain disorder involving the narrow muscle in the buttocks (piriformis  syndrome).  Pelvic injury or fracture.  Pregnancy.  Tumor (rare). SYMPTOMS  Symptoms can vary from mild to very severe. The symptoms usually travel from the low back to the buttocks and down the back of the leg. Symptoms can include:  Mild tingling or dull aches in the lower back, leg, or hip.  Numbness in the back of the calf or sole of the foot.  Burning sensations in the lower  back, leg, or hip.  Sharp pains in the lower back, leg, or hip.  Leg weakness.  Severe back pain inhibiting movement. These symptoms may get worse with coughing, sneezing, laughing, or prolonged sitting or standing. Also, being overweight may worsen symptoms. DIAGNOSIS  Your caregiver will perform a physical exam to look for common symptoms of sciatica. He or she may ask you to do certain movements or activities that would trigger sciatic nerve pain. Other tests may be performed to find the cause of the sciatica. These may include:  Blood tests.  X-rays.  Imaging tests, such as an MRI or CT scan. TREATMENT  Treatment is directed at the cause of the sciatic pain. Sometimes, treatment is not necessary and the pain and discomfort goes away on its own. If treatment is needed, your caregiver may suggest:  Over-the-counter medicines to relieve pain.  Prescription medicines, such as anti-inflammatory medicine, muscle relaxants, or narcotics.  Applying heat or ice to the painful area.  Steroid injections to lessen pain, irritation, and inflammation around the nerve.  Reducing activity during periods of pain.  Exercising and stretching to strengthen your abdomen and improve flexibility of your spine. Your caregiver may suggest losing weight if the extra weight makes the back pain worse.  Physical therapy.  Surgery to eliminate what is pressing or pinching the nerve, such as a bone spur or part of a herniated disk. HOME CARE INSTRUCTIONS   Only take over-the-counter or prescription medicines for pain  or discomfort as directed by your caregiver.  Apply ice to the affected area for 20 minutes, 3-4 times a day for the first 48-72 hours. Then try heat in the same way.  Exercise, stretch, or perform your usual activities if these do not aggravate your pain.  Attend physical therapy sessions as directed by your caregiver.  Keep all follow-up appointments as directed by your caregiver.  Do not wear high heels or shoes that do not provide proper support.  Check your mattress to see if it is too soft. A firm mattress may lessen your pain and discomfort. SEEK IMMEDIATE MEDICAL CARE IF:   You lose control of your bowel or bladder (incontinence).  You have increasing weakness in the lower back, pelvis, buttocks, or legs.  You have redness or swelling of your back.  You have a burning sensation when you urinate.  You have pain that gets worse when you lie down or awakens you at night.  Your pain is worse than you have experienced in the past.  Your pain is lasting longer than 4 weeks.  You are suddenly losing weight without reason. MAKE SURE YOU:  Understand these instructions.  Will watch your condition.  Will get help right away if you are not doing well or get worse. Document Released: 01/08/2001 Document Revised: 07/16/2011 Document Reviewed: 05/26/2011 [facility] Patient Information 2015 [place], [place]. This information is not intended to replace advice given to you by your health care provider. Make sure you discuss any questions you have with your health care provider.

## 2014-08-02 NOTE — ED Notes (Signed)
Pt. Stated, I had a leg pain that started on June 21 , came here and its still hurting.

## 2017-11-07 ENCOUNTER — Other Ambulatory Visit: Payer: Self-pay | Admitting: Emergency Medicine

## 2017-11-07 MED ORDER — CARVEDILOL 12.5 MG PO TABS: 12.5000 mg | ORAL_TABLET | Freq: Two times a day (BID) | ORAL | 0 refills | Status: DC

## 2017-11-07 NOTE — Telephone Encounter (Signed)
Dr. Wynonia Lawman patient. Per Dr. Agustin Cree refilled carvedilol 12.5 mg bid 90 day supply.

## 2018-02-17 ENCOUNTER — Other Ambulatory Visit: Payer: Self-pay | Admitting: Cardiology

## 2018-02-17 NOTE — Telephone Encounter (Signed)
Has been given to Dr. Revankar for him to review  

## 2018-02-18 ENCOUNTER — Other Ambulatory Visit: Payer: Self-pay | Admitting: Cardiology

## 2018-02-18 NOTE — Telephone Encounter (Signed)
°  1. Which medications need to be refilled? (please list name of each medication and dose if known) Quinapril 40mg  once daily; hydralazine 25mg  tab BID; amlodipine 5mg  tab once daily  2. Which pharmacy/location (including street and city if local pharmacy) is medication to be sent to?[facility]  3. Do they need a 30 day or 90 day supply? [place]

## 2018-02-19 MED ORDER — QUINAPRIL HCL 40 MG PO TABS: 40.0000 mg | ORAL_TABLET | Freq: Every day | ORAL | 0 refills | Status: DC

## 2018-02-19 MED ORDER — HYDRALAZINE HCL 25 MG PO TABS: 25.0000 mg | ORAL_TABLET | Freq: Two times a day (BID) | ORAL | 0 refills | Status: DC

## 2018-02-19 MED ORDER — AMLODIPINE BESYLATE 5 MG PO TABS: 5.0000 mg | ORAL_TABLET | Freq: Every day | ORAL | 0 refills | Status: DC

## 2018-02-19 NOTE — Telephone Encounter (Signed)
Has been given to Dr. Revankar for him to review  

## 2018-02-19 NOTE — Telephone Encounter (Signed)
Refilled Quanapril 40 mg daily, amlodipine 5 mg daily, and hydralazine 25 mg twice daily per Dr. Geraldo Pitter for 1 month supply. Will give to front office for appointment to be made

## 2018-02-19 NOTE — Telephone Encounter (Signed)
Patient needs and appointment. I left voicemail for patient to return call

## 2018-02-20 ENCOUNTER — Telehealth: Payer: Self-pay | Admitting: Cardiology

## 2018-02-20 NOTE — Telephone Encounter (Signed)
LAM for patient to call back to schedule follow up appt.

## 2018-04-29 ENCOUNTER — Other Ambulatory Visit: Payer: Self-pay | Admitting: Cardiology

## 2018-04-29 NOTE — Telephone Encounter (Signed)
Received refill requests for quinopril, amlodipine, carvedilol and hydralazine. Last office visit appears to have been several years ago. Called patient to inquire when/with whom last office visit was and who has been prescribing medications. No answer, left voicemail message.

## 2018-09-22 ENCOUNTER — Other Ambulatory Visit: Payer: Self-pay | Admitting: Cardiology

## 2018-11-03 ENCOUNTER — Other Ambulatory Visit: Payer: Self-pay | Admitting: Cardiology

## 2018-11-13 ENCOUNTER — Other Ambulatory Visit: Payer: Self-pay | Admitting: Cardiology

## 2018-11-13 NOTE — Telephone Encounter (Signed)
Please advise if these can be refilled. He is Dealer patient. Has appointment with Dr. Bettina Gavia next week. Wynonia Lawman records are in media.

## 2018-11-20 ENCOUNTER — Ambulatory Visit: Payer: Medicare Other | Admitting: Family

## 2018-11-20 NOTE — Progress Notes (Deleted)
    Office Visit    Patient Name: Frank Solomon Date of Encounter: 11/20/2018  Primary Care Provider:  Izora Ribas, MD Primary Cardiologist:  No primary care provider on file. Electrophysiologist:  None   Chief Complaint    Frank Solomon is a 58 y.o. male with a hx of *** presents today for ***   Past Medical History    Past Medical History:  Diagnosis Date  . 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 CHF (congestive heart failure)   . TIA (transient ischemic attack)    Past Surgical History:  Procedure Laterality Date  . VIDEO ASSISTED THORACOSCOPY (VATS)/DECORTICATION Right 09/07/2013   Procedure: VIDEO ASSISTED THORACOSCOPY (VATS)/ DRAINAGE OF EMPYEMA/ DECORTICATION;  Surgeon: Melrose Nakayama, MD;  Location: [place];  Service: Thoracic;  Laterality: Right;    Allergies  No Known Allergies  History of Present Illness    Frank Solomon is a 58 y.o. male with a hx of *** last seen ***.  EKGs/Labs/Other Studies Reviewed:   The following studies were reviewed today: ***  EKG:  EKG is *** ordered today.  The ekg ordered today demonstrates ***  Recent Labs: No results found for requested labs within last 8760 hours.  Recent Lipid Panel No results found for: CHOL, TRIG, HDL, CHOLHDL, VLDL, LDLCALC, LDLDIRECT  Home Medications   No outpatient medications have been marked as taking for the 11/20/18 encounter (Appointment) with Loel Dubonnet, NP.      Review of Systems    ***   ROS All other systems reviewed and are otherwise negative except as noted above.  Physical Exam    VS:  There were no vitals taken for this visit. , BMI There is no height or weight on file to calculate BMI. GEN: Well nourished, well developed, in no acute distress. HEENT: normal. Neck: Supple, no JVD, carotid bruits, or masses. Cardiac: ***RRR, no murmurs, rubs, or gallops. No clubbing, cyanosis, edema.  ***Radials/DP/PT 2+ and equal bilaterally.   Respiratory:  ***Respirations regular and unlabored, clear to auscultation bilaterally. GI: Soft, nontender, nondistended, BS + x 4. MS: No deformity or atrophy. Skin: Warm and dry, no rash. Neuro:  Strength and sensation are intact. Psych: Normal affect.  Accessory Clinical Findings    ECG personally reviewed by me today - *** - no acute changes.  Assessment & Plan    1. ***  Disposition: Follow up {follow up:15908} with ***   Loel Dubonnet, NP 11/20/2018, 12:44 PM

## 2018-12-14 ENCOUNTER — Encounter (HOSPITAL_COMMUNITY): Payer: Self-pay | Admitting: Physician Assistant

## 2018-12-14 ENCOUNTER — Inpatient Hospital Stay (HOSPITAL_COMMUNITY)
Admission: EM | Admit: 2018-12-14 | Discharge: 2018-12-18 | DRG: 287 | Disposition: A | Discharge status: Home / Self Care | Payer: Medicare Other | Attending: Family Medicine | Admitting: Family Medicine

## 2018-12-14 ENCOUNTER — Encounter (HOSPITAL_COMMUNITY)
Admission: EM | Disposition: A | Discharge status: Home / Self Care | Payer: Self-pay | Source: Home / Self Care | Attending: Family Medicine

## 2018-12-14 ENCOUNTER — Emergency Department (HOSPITAL_COMMUNITY): Payer: Medicare Other

## 2018-12-14 ENCOUNTER — Observation Stay (HOSPITAL_BASED_OUTPATIENT_CLINIC_OR_DEPARTMENT_OTHER): Payer: Medicare Other

## 2018-12-14 DIAGNOSIS — Z79899 Other long term (current) drug therapy: Secondary | ICD-10-CM

## 2018-12-14 DIAGNOSIS — I420 Dilated cardiomyopathy: Secondary | ICD-10-CM | POA: Diagnosis not present

## 2018-12-14 DIAGNOSIS — R079 Chest pain, unspecified: Secondary | ICD-10-CM

## 2018-12-14 DIAGNOSIS — Z79891 Long term (current) use of opiate analgesic: Secondary | ICD-10-CM

## 2018-12-14 DIAGNOSIS — Z20828 Contact with and (suspected) exposure to other viral communicable diseases: Secondary | ICD-10-CM | POA: Diagnosis present

## 2018-12-14 DIAGNOSIS — I25110 Atherosclerotic heart disease of native coronary artery with unstable angina pectoris: Secondary | ICD-10-CM | POA: Diagnosis not present

## 2018-12-14 DIAGNOSIS — H538 Other visual disturbances: Secondary | ICD-10-CM

## 2018-12-14 DIAGNOSIS — E872 Acidosis: Secondary | ICD-10-CM | POA: Diagnosis present

## 2018-12-14 DIAGNOSIS — R7989 Other specified abnormal findings of blood chemistry: Secondary | ICD-10-CM

## 2018-12-14 DIAGNOSIS — Z23 Encounter for immunization: Secondary | ICD-10-CM

## 2018-12-14 DIAGNOSIS — R7303 Prediabetes: Secondary | ICD-10-CM | POA: Diagnosis present

## 2018-12-14 DIAGNOSIS — R072 Precordial pain: Secondary | ICD-10-CM | POA: Diagnosis not present

## 2018-12-14 DIAGNOSIS — E785 Hyperlipidemia, unspecified: Secondary | ICD-10-CM | POA: Diagnosis present

## 2018-12-14 DIAGNOSIS — F17210 Nicotine dependence, cigarettes, uncomplicated: Secondary | ICD-10-CM | POA: Diagnosis present

## 2018-12-14 DIAGNOSIS — I200 Unstable angina: Secondary | ICD-10-CM

## 2018-12-14 DIAGNOSIS — I428 Other cardiomyopathies: Secondary | ICD-10-CM

## 2018-12-14 DIAGNOSIS — Z9114 Patient's other noncompliance with medication regimen: Secondary | ICD-10-CM

## 2018-12-14 DIAGNOSIS — Z7952 Long term (current) use of systemic steroids: Secondary | ICD-10-CM

## 2018-12-14 DIAGNOSIS — I110 Hypertensive heart disease with heart failure: Secondary | ICD-10-CM | POA: Diagnosis present

## 2018-12-14 DIAGNOSIS — I10 Essential (primary) hypertension: Secondary | ICD-10-CM

## 2018-12-14 DIAGNOSIS — Z8673 Personal history of transient ischemic attack (TIA), and cerebral infarction without residual deficits: Secondary | ICD-10-CM

## 2018-12-14 DIAGNOSIS — I5042 Chronic combined systolic (congestive) and diastolic (congestive) heart failure: Secondary | ICD-10-CM | POA: Diagnosis present

## 2018-12-14 DIAGNOSIS — I160 Hypertensive urgency: Secondary | ICD-10-CM | POA: Diagnosis present

## 2018-12-14 HISTORY — PX: LEFT HEART CATH AND CORONARY ANGIOGRAPHY: CATH118249

## 2018-12-14 LAB — CBC WITH DIFFERENTIAL/PLATELET
Abs Immature Granulocytes: 0.03 10*3/uL (ref 0.00–0.07)
Basophils Absolute: 0 10*3/uL (ref 0.0–0.1)
Basophils Relative: 0 %
Eosinophils Absolute: 0 10*3/uL (ref 0.0–0.5)
Eosinophils Relative: 0 %
HCT: 41.7 % (ref 39.0–52.0)
Hemoglobin: 13.8 g/dL (ref 13.0–17.0)
Immature Granulocytes: 0 %
Lymphocytes Relative: 24 %
Lymphs Abs: 2 10*3/uL (ref 0.7–4.0)
MCH: 34.2 pg — ABNORMAL HIGH (ref 26.0–34.0)
MCHC: 33.1 g/dL (ref 30.0–36.0)
MCV: 103.5 fL — ABNORMAL HIGH (ref 80.0–100.0)
Monocytes Absolute: 0.5 10*3/uL (ref 0.1–1.0)
Monocytes Relative: 7 %
Neutro Abs: 5.5 10*3/uL (ref 1.7–7.7)
Neutrophils Relative %: 69 %
Platelets: 212 10*3/uL (ref 150–400)
RBC: 4.03 MIL/uL — ABNORMAL LOW (ref 4.22–5.81)
RDW: 14.6 % (ref 11.5–15.5)
WBC: 8.1 10*3/uL (ref 4.0–10.5)
nRBC: 0 % (ref 0.0–0.2)

## 2018-12-14 LAB — CBC
HCT: 42.2 % (ref 39.0–52.0)
Hemoglobin: 14 g/dL (ref 13.0–17.0)
MCH: 33.4 pg (ref 26.0–34.0)
MCHC: 33.2 g/dL (ref 30.0–36.0)
MCV: 100.7 fL — ABNORMAL HIGH (ref 80.0–100.0)
Platelets: 188 10*3/uL (ref 150–400)
RBC: 4.19 MIL/uL — ABNORMAL LOW (ref 4.22–5.81)
RDW: 14.8 % (ref 11.5–15.5)
WBC: 8.4 10*3/uL (ref 4.0–10.5)
nRBC: 0 % (ref 0.0–0.2)

## 2018-12-14 LAB — LIPID PANEL
Cholesterol: 205 mg/dL — ABNORMAL HIGH (ref 0–200)
HDL: 46 mg/dL (ref >40)
LDL Cholesterol: 146 mg/dL — ABNORMAL HIGH (ref 0–99)
Total CHOL/HDL Ratio: 4.5 RATIO
Triglycerides: 65 mg/dL (ref <150)
VLDL: 13 mg/dL (ref 0–40)

## 2018-12-14 LAB — COMPREHENSIVE METABOLIC PANEL
ALT: 20 U/L (ref 0–44)
AST: 36 U/L (ref 15–41)
Albumin: 3.2 g/dL — ABNORMAL LOW (ref 3.5–5.0)
Alkaline Phosphatase: 108 U/L (ref 38–126)
Anion gap: 9 (ref 5–15)
BUN: 13 mg/dL (ref 6–20)
CO2: 19 mmol/L — ABNORMAL LOW (ref 22–32)
Calcium: 8.6 mg/dL — ABNORMAL LOW (ref 8.9–10.3)
Chloride: 112 mmol/L — ABNORMAL HIGH (ref 98–111)
Creatinine, Ser: 1.03 mg/dL (ref 0.61–1.24)
GFR calc Af Amer: >60 mL/min (ref >60)
GFR calc non Af Amer: >60 mL/min (ref >60)
Glucose, Bld: 113 mg/dL — ABNORMAL HIGH (ref 70–99)
Potassium: 3.9 mmol/L (ref 3.5–5.1)
Sodium: 140 mmol/L (ref 135–145)
Total Bilirubin: 0.9 mg/dL (ref 0.3–1.2)
Total Protein: 6.1 g/dL — ABNORMAL LOW (ref 6.5–8.1)

## 2018-12-14 LAB — CREATININE, SERUM
Creatinine, Ser: 0.88 mg/dL (ref 0.61–1.24)
GFR calc Af Amer: >60 mL/min (ref >60)
GFR calc non Af Amer: >60 mL/min (ref >60)

## 2018-12-14 LAB — TSH: TSH: 0.694 u[IU]/mL (ref 0.350–4.500)

## 2018-12-14 LAB — TROPONIN I (HIGH SENSITIVITY)
Troponin I (High Sensitivity): 13 ng/L (ref <18)
Troponin I (High Sensitivity): 13 ng/L (ref <18)
Troponin I (High Sensitivity): 15 ng/L (ref <18)

## 2018-12-14 LAB — PROTIME-INR
INR: 1 (ref 0.8–1.2)
Prothrombin Time: 13.1 seconds (ref 11.4–15.2)

## 2018-12-14 LAB — SARS CORONAVIRUS 2 BY RT PCR (HOSPITAL ORDER, PERFORMED IN ~~LOC~~ HOSPITAL LAB): SARS Coronavirus 2: NEGATIVE

## 2018-12-14 LAB — ECHOCARDIOGRAM COMPLETE
Height: 72 in
Weight: 3833.6 oz

## 2018-12-14 LAB — BRAIN NATRIURETIC PEPTIDE: B Natriuretic Peptide: 477.3 pg/mL — ABNORMAL HIGH (ref 0.0–100.0)

## 2018-12-14 LAB — HEMOGLOBIN A1C
Hgb A1c MFr Bld: 6.3 % — ABNORMAL HIGH (ref 4.8–5.6)
Mean Plasma Glucose: 134.11 mg/dL

## 2018-12-14 SURGERY — LEFT HEART CATH AND CORONARY ANGIOGRAPHY
Anesthesia: LOCAL

## 2018-12-14 MED ORDER — SODIUM CHLORIDE 0.9% FLUSH
3.0000 mL | Freq: Two times a day (BID) | INTRAVENOUS | Status: DC
  Administered 2018-12-14 – 2018-12-18 (×8): 3 mL via INTRAVENOUS

## 2018-12-14 MED ORDER — CARVEDILOL 12.5 MG PO TABS
12.5000 mg | ORAL_TABLET | Freq: Two times a day (BID) | ORAL | Status: DC
  Administered 2018-12-14: 12.5 mg via ORAL
  Filled 2018-12-14: qty 1

## 2018-12-14 MED ORDER — SALINE SPRAY 0.65 % NA SOLN
1.0000 | NASAL | Status: DC | PRN
  Administered 2018-12-14: 21:00:00 1 via NASAL
  Filled 2018-12-14: qty 44

## 2018-12-14 MED ORDER — LABETALOL HCL 5 MG/ML IV SOLN
10.0000 mg | INTRAVENOUS | Status: AC | PRN
  Administered 2018-12-14: 19:00:00 10 mg via INTRAVENOUS
  Filled 2018-12-14: qty 4

## 2018-12-14 MED ORDER — SODIUM CHLORIDE 0.9 % IV SOLN: INTRAVENOUS | Status: AC

## 2018-12-14 MED ORDER — MIDAZOLAM HCL 2 MG/2ML IJ SOLN
INTRAMUSCULAR | Status: DC | PRN
  Administered 2018-12-14: 2 mg via INTRAVENOUS

## 2018-12-14 MED ORDER — ATORVASTATIN CALCIUM 80 MG PO TABS
80.0000 mg | ORAL_TABLET | Freq: Every day | ORAL | Status: DC
  Administered 2018-12-14 – 2018-12-17 (×4): 80 mg via ORAL
  Filled 2018-12-14 (×4): qty 1

## 2018-12-14 MED ORDER — HYDRALAZINE HCL 20 MG/ML IJ SOLN
INTRAMUSCULAR | Status: AC
  Filled 2018-12-14: qty 1

## 2018-12-14 MED ORDER — METOPROLOL TARTRATE 5 MG/5ML IV SOLN
5.0000 mg | INTRAVENOUS | Status: DC | PRN
  Filled 2018-12-14 (×2): qty 5

## 2018-12-14 MED ORDER — SODIUM CHLORIDE 0.9% FLUSH
3.0000 mL | INTRAVENOUS | Status: DC | PRN
  Administered 2018-12-14: 3 mL via INTRAVENOUS
  Filled 2018-12-14: qty 3

## 2018-12-14 MED ORDER — NITROGLYCERIN 2 % TD OINT
1.0000 [in_us] | TOPICAL_OINTMENT | Freq: Once | TRANSDERMAL | Status: AC
  Administered 2018-12-14: 1 [in_us] via TOPICAL
  Filled 2018-12-14: qty 1

## 2018-12-14 MED ORDER — HEPARIN SODIUM (PORCINE) 5000 UNIT/ML IJ SOLN
5000.0000 [IU] | Freq: Three times a day (TID) | INTRAMUSCULAR | Status: DC
  Administered 2018-12-14 – 2018-12-18 (×11): 5000 [IU] via SUBCUTANEOUS
  Filled 2018-12-14 (×11): qty 1

## 2018-12-14 MED ORDER — SODIUM CHLORIDE 0.9 % IV SOLN: 250.0000 mL | INTRAVENOUS | Status: DC | PRN

## 2018-12-14 MED ORDER — ACETAMINOPHEN 325 MG PO TABS
ORAL_TABLET | ORAL | Status: AC
  Filled 2018-12-14: qty 2

## 2018-12-14 MED ORDER — ASPIRIN 81 MG PO CHEW
81.0000 mg | CHEWABLE_TABLET | ORAL | Status: AC
  Administered 2018-12-14: 10:00:00 81 mg via ORAL
  Filled 2018-12-14: qty 1

## 2018-12-14 MED ORDER — SODIUM CHLORIDE 0.9 % WEIGHT BASED INFUSION: 3.0000 mL/kg/h | INTRAVENOUS | Status: DC

## 2018-12-14 MED ORDER — PANTOPRAZOLE SODIUM 40 MG PO TBEC
40.0000 mg | DELAYED_RELEASE_TABLET | Freq: Every day | ORAL | Status: DC
  Administered 2018-12-15 – 2018-12-18 (×4): 40 mg via ORAL
  Filled 2018-12-14 (×5): qty 1

## 2018-12-14 MED ORDER — FENTANYL CITRATE (PF) 100 MCG/2ML IJ SOLN
INTRAMUSCULAR | Status: DC | PRN
  Administered 2018-12-14: 25 ug via INTRAVENOUS

## 2018-12-14 MED ORDER — LIDOCAINE HCL (PF) 1 % IJ SOLN
INTRAMUSCULAR | Status: AC
  Filled 2018-12-14: qty 30

## 2018-12-14 MED ORDER — HEPARIN (PORCINE) IN NACL 1000-0.9 UT/500ML-% IV SOLN
INTRAVENOUS | Status: AC
  Filled 2018-12-14: qty 1000

## 2018-12-14 MED ORDER — VERAPAMIL HCL 2.5 MG/ML IV SOLN
INTRAVENOUS | Status: DC | PRN
  Administered 2018-12-14: 10 mL via INTRA_ARTERIAL

## 2018-12-14 MED ORDER — MIDAZOLAM HCL 2 MG/2ML IJ SOLN
INTRAMUSCULAR | Status: AC
  Filled 2018-12-14: qty 2

## 2018-12-14 MED ORDER — ONDANSETRON HCL 4 MG/2ML IJ SOLN: 4.0000 mg | Freq: Four times a day (QID) | INTRAMUSCULAR | Status: DC | PRN

## 2018-12-14 MED ORDER — HEPARIN SODIUM (PORCINE) 1000 UNIT/ML IJ SOLN
INTRAMUSCULAR | Status: DC | PRN
  Administered 2018-12-14: 5000 [IU] via INTRAVENOUS

## 2018-12-14 MED ORDER — ACETAMINOPHEN 325 MG PO TABS
650.0000 mg | ORAL_TABLET | ORAL | Status: DC | PRN
  Administered 2018-12-14 – 2018-12-17 (×6): 650 mg via ORAL
  Filled 2018-12-14 (×5): qty 2

## 2018-12-14 MED ORDER — NITROGLYCERIN 0.4 MG SL SUBL: 0.4000 mg | SUBLINGUAL_TABLET | SUBLINGUAL | Status: DC | PRN

## 2018-12-14 MED ORDER — HYDRALAZINE HCL 20 MG/ML IJ SOLN
10.0000 mg | INTRAMUSCULAR | Status: AC | PRN
  Administered 2018-12-14 (×2): 10 mg via INTRAVENOUS

## 2018-12-14 MED ORDER — IOHEXOL 350 MG/ML SOLN
INTRAVENOUS | Status: DC | PRN
  Administered 2018-12-14: 45 mL

## 2018-12-14 MED ORDER — SODIUM CHLORIDE 0.9 % WEIGHT BASED INFUSION
1.0000 mL/kg/h | INTRAVENOUS | Status: DC
  Administered 2018-12-14: 11:00:00 1 mL/kg/h via INTRAVENOUS

## 2018-12-14 MED ORDER — SODIUM CHLORIDE 0.9% FLUSH
3.0000 mL | Freq: Two times a day (BID) | INTRAVENOUS | Status: DC
  Administered 2018-12-14 – 2018-12-18 (×9): 3 mL via INTRAVENOUS

## 2018-12-14 MED ORDER — HEPARIN (PORCINE) IN NACL 1000-0.9 UT/500ML-% IV SOLN
INTRAVENOUS | Status: DC | PRN
  Administered 2018-12-14 (×2): 500 mL

## 2018-12-14 MED ORDER — HEPARIN SODIUM (PORCINE) 1000 UNIT/ML IJ SOLN
INTRAMUSCULAR | Status: AC
  Filled 2018-12-14: qty 1

## 2018-12-14 MED ORDER — NITROGLYCERIN 2 % TD OINT
0.5000 [in_us] | TOPICAL_OINTMENT | Freq: Four times a day (QID) | TRANSDERMAL | Status: DC
  Filled 2018-12-14: qty 30

## 2018-12-14 MED ORDER — VERAPAMIL HCL 2.5 MG/ML IV SOLN
INTRAVENOUS | Status: AC
  Filled 2018-12-14: qty 2

## 2018-12-14 MED ORDER — FENTANYL CITRATE (PF) 100 MCG/2ML IJ SOLN
INTRAMUSCULAR | Status: AC
  Filled 2018-12-14: qty 2

## 2018-12-14 MED ORDER — LIDOCAINE HCL (PF) 1 % IJ SOLN
INTRAMUSCULAR | Status: DC | PRN
  Administered 2018-12-14: 2 mL

## 2018-12-14 SURGICAL SUPPLY — 9 items

## 2018-12-14 NOTE — ED Triage Notes (Signed)
Pt coming by EMS after developing tingling in L hand, diaphoresis, shob when laying down, and chest pressure. Hx of TIA in 1993, LKN 2258. BP elevated, 214/130, HR 100, NSR. Pt took 2x 325mg  ASA, on beta blocker but has been out of one of his BP meds for a while. Pt received 2 doses of nitro and decreased pressure to 166/100. RL llung sounds diminished

## 2018-12-14 NOTE — Consult Note (Addendum)
Cardiology Consultation:   Patient ID: Frank Solomon; [id number]; May 27, 1960   Admit date: 12/14/2018 Date of Consult: 12/14/2018  Primary Care Provider: Loretha Brasil, FNP Primary Cardiologist: Candee Furbish, MD [place], was Dr Wynonia Lawman Primary Electrophysiologist:  None   Patient Profile:   Frank Solomon is a 58 y.o. 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  History of Present Illness:   [name] had been seeing Dr Wynonia Lawman every 6 months. Last stress test was 2 yr ago or so.  He has been out of his BP medications x 1 month or more. Dr Wynonia Lawman retired and PCP left the practice. Saw new PCP last week.   He does not exercise, has sciatica. No previous hx of exertional sx. However, has been getting some chest pressure when he walks up hills for the last couple of weeks.   Last pm, he laid down and suddenly could not catch his breath. Had chest heaviness 8/10, has never been that bad before. He was sweating, no N&V. He sat up, that helped a little but he still could not breathe well. No change w/ deep inspiration. He also had a cough. Took cough medicine, no other rx.   Cough medicine did not help. L hand started tingling, all the way up to his shoulder. Had tingling with the TIA, but otherwise no sx reminded him of that.   He called 911. He took ASA 650 mg. EMS gave him SL NTG x 2, that helped the pain and his breathing. BP initially 214/130. Pain went to a 3-4/10. ER gave him NTG paste, pain resolved.    Past Medical History:  Diagnosis Date  . Cardiomyopathy, dilated ([place])    Diagnosed 1999 had biopsy and also VT  . CHF (congestive heart failure) ([place])   . TIA (transient ischemic attack)     Past Surgical History:  Procedure Laterality Date  . CARDIAC CATHETERIZATION  1999   Dr Wynonia Lawman. per pt, was ok  . VIDEO ASSISTED  THORACOSCOPY (VATS)/DECORTICATION Right 09/07/2013   Procedure: VIDEO ASSISTED THORACOSCOPY (VATS)/ DRAINAGE OF EMPYEMA/ DECORTICATION;  Surgeon: Melrose Nakayama, MD;  Location: [place];  Service: Thoracic;  Laterality: Right;     Prior to Admission medications   Medication Sig Start Date End Date Taking? Authorizing Provider  amLODipine (NORVASC) 5 MG tablet TAKE 1 TABLET BY MOUTH  DAILY 04/29/18  Yes Richardo Priest, MD  carvedilol (COREG) 12.5 MG tablet TAKE 1 TABLET BY MOUTH TWO  TIMES DAILY 04/29/18  Yes Richardo Priest, MD  hydrALAZINE (APRESOLINE) 25 MG tablet TAKE 1 TABLET BY MOUTH TWO  TIMES DAILY 04/29/18  Yes Richardo Priest, MD  quinapril (ACCUPRIL) 40 MG tablet TAKE 1 TABLET BY MOUTH  DAILY Patient taking differently: Take 40 mg by mouth daily.  04/29/18  Yes Richardo Priest, MD  cyclobenzaprine (FLEXERIL) 10 MG tablet Take 1 tablet (10 mg total) by mouth 3 (three) times daily as needed for muscle spasms. Patient not taking: Reported on 12/14/2018 08/02/14   Street, [place], PA-C  furosemide (LASIX) 20 MG tablet Take 1 tablet (20 mg total) by mouth daily. Patient not taking: Reported on 12/14/2018 09/17/13   Reyne Dumas, MD  HYDROcodone-acetaminophen (NORCO/VICODIN) 5-325 MG per tablet Take 1 tablet by mouth every 6 (six) hours as needed for moderate  pain. Patient not taking: Reported on 12/14/2018 07/19/14   Dalia Heading, PA-C  naproxen (NAPROSYN) 500 MG tablet Take 1 tablet (500 mg total) by mouth 2 (two) times daily as needed for mild pain, moderate pain or headache (TAKE WITH MEALS.). Patient not taking: Reported on 12/14/2018 08/02/14   Street, [place], [place]  oxyCODONE (OXY IR/ROXICODONE) 5 MG immediate release tablet Take 1-2 tablets (5-10 mg total) by mouth every 6 (six) hours as needed for severe pain. Patient not taking: Reported on 07/19/2014 09/28/13   Melrose Nakayama, MD  oxyCODONE-acetaminophen (PERCOCET) 5-325 MG per tablet Take 1 tablet by mouth every 6 (six) hours  as needed for severe pain. Patient not taking: Reported on 12/14/2018 08/02/14   Street, [place], PA-C  pantoprazole (PROTONIX) 40 MG tablet Take 1 tablet (40 mg total) by mouth 2 (two) times daily. Patient not taking: Reported on 07/19/2014 09/17/13   Reyne Dumas, MD  predniSONE (DELTASONE) 50 MG tablet Take 1 tablet (50 mg total) by mouth daily. Patient not taking: Reported on 12/14/2018 07/19/14   Dalia Heading, PA-C  senna (SENOKOT) 8.6 MG TABS tablet Take 1 tablet (8.6 mg total) by mouth daily as needed for mild constipation (when constipated beyond scheduled dose). Patient not taking: Reported on 07/19/2014 09/17/13   Reyne Dumas, MD    Inpatient Medications: Scheduled Meds: . nitroGLYCERIN  1 inch Topical Once   Continuous Infusions:  PRN Meds: nitroGLYCERIN  Allergies:   No Known Allergies  Social History:   Social History   Socioeconomic History  . Marital status: Divorced    Spouse name: Not on file  . Number of children: Not on file  . Years of education: Not on file  . Highest education level: Not on file  Occupational History  . Occupation: [employer], part-time  Social Needs  . Financial resource strain: Not on file  . Food insecurity    Worry: Not on file    Inability: Not on file  . Transportation needs    Medical: Not on file    Non-medical: Not on file  Tobacco Use  . Smoking status: Current Some Day Smoker    Packs/day: 0.10    Types: Cigarettes  . Smokeless tobacco: Never Used  Substance and Sexual Activity  . Alcohol use: No  . Drug use: No  . Sexual activity: Not on file  Lifestyle  . Physical activity    Days per week: Not on file    Minutes per session: Not on file  . Stress: Not on file  Relationships  . Social Herbalist on phone: Not on file    Gets together: Not on file    Attends religious service: Not on file    Active member of club or organization: Not on file    Attends meetings of clubs or organizations: Not on file     Relationship status: Not on file  . Intimate partner violence    Fear of current or ex partner: Not on file    Emotionally abused: Not on file    Physically abused: Not on file    Forced sexual activity: Not on file  Other Topics Concern  . Not on file  Social History Narrative  . Not on file    Family History:   Family History  Problem Relation Age of Onset  . Dementia Mother   . Diabetes Brother    Family Status:  Family Status  Relation Name Status  . Father  Deceased  at age 79       Alcoholism  . Mother  Alive       dementia  . Brother  Alive  . Brother  Alive  . Sister  Alive  . Sister  Deceased       Congenital heart disease  . Sister  Deceased       AIDS    ROS:  Please see the history of present illness.  All other ROS reviewed and negative.     Physical Exam/Data:   Vitals:   12/14/18 0230 12/14/18 0245 12/14/18 0300 12/14/18 0315  BP: (!) 150/97 (!) 139/91 137/88 134/87  Pulse: 78 73 75 86  Resp:    (!) 22  Temp:      TempSrc:      SpO2: 96% 96% 96% 98%   No intake or output data in the 24 hours ending 12/14/18 0737 There were no vitals filed for this visit. There is no height or weight on file to calculate BMI.  General:  Well nourished, well developed, male in no acute distress HEENT: normal Lymph: no adenopathy Neck: JVD - not elevated Endocrine:  No thryomegaly Vascular: No carotid bruits; 4/4 extremity pulses 2+, without bruits  Cardiac:  normal S1, S2; RRR; no murmur Lungs:  clear bilaterally, no wheezing, rhonchi or rales  Abd: soft, nontender, no hepatomegaly  Ext: no edema Musculoskeletal:  No deformities, BUE and BLE strength normal and equal Skin: warm and dry  Neuro:  CNs 2-12 intact, no focal abnormalities noted Psych:  Normal affect   EKG:  The EKG was personally reviewed and demonstrates:  11/16 ECG is SR, HR 90, inferolateral T wave changes are different from 2015 Telemetry:  Telemetry was personally reviewed and  demonstrates:  SR   CV studies:   ECHO: 2015 - Left ventricle: The cavity size was normal. Wall thickness was  increased in a pattern of mild LVH. Systolic function was  moderately reduced. The estimated ejection fraction was in the  range of 35% to 40%. Diffuse hypokinesis. Doppler parameters are  consistent with abnormal left ventricular relaxation (grade 1  diastolic dysfunction).  - Aortic valve: There was no stenosis.  - Mitral valve: There was no significant regurgitation.  - Right ventricle: The cavity size was normal. Systolic function  was normal.  - Pulmonary arteries: No complete TR doppler jet so unable to  estimate PA systolic pressure.  - Inferior vena cava: The vessel was normal in size. The  respirophasic diameter changes were in the normal range (>= 50%),  consistent with normal central venous pressure.  - Pericardium, extracardiac: A trivial pericardial effusion was  identified.   Impressions:   - Normal LV size with mild LV hypertrophy. EF 35-40% with diffuse  hypokinesis. Normal RV size and systolic function. No significant  valvular abnormality noted. Indication for study was sepsis. I do  not see definite endocarditis.    Laboratory Data:   Chemistry Recent Labs  Lab 12/14/18 0214  NA 140  K 3.9  CL 112*  CO2 19*  GLUCOSE 113*  BUN 13  CREATININE 1.03  CALCIUM 8.6*  GFRNONAA >60  GFRAA >60  ANIONGAP 9    Lab Results  Component Value Date   ALT 20 12/14/2018   AST 36 12/14/2018   ALKPHOS 108 12/14/2018   BILITOT 0.9 12/14/2018   Hematology Recent Labs  Lab 12/14/18 0214  WBC 8.1  RBC 4.03*  HGB 13.8  HCT 41.7  MCV 103.5[facility]  34.2*  MCHC 33.1  RDW 14.6  PLT 212   Cardiac Enzymes High Sensitivity Troponin:   Recent Labs  Lab 12/14/18 0214  TROPONINIHS 13      BNP Recent Labs  Lab 12/14/18 0214  BNP 477.3*    TSH: No results found for: TSH Lipids:No results found for: CHOL, HDL, LDLCALC,  LDLDIRECT, TRIG, CHOLHDL HgbA1c:No results found for: HGBA1C Magnesium:  Magnesium  Date Value Ref Range Status  09/13/2013 2.1 1.5 - 2.5 mg/dL Final     Radiology/Studies:  Dg Chest 2 View  Result Date: 12/14/2018 CLINICAL DATA:  Chest pain. Shortness of breath when lying down. Tingling in left hand. EXAM: CHEST - 2 VIEW COMPARISON:  Radiograph 09/28/2013. CT 09/06/2013 FINDINGS: Chronic pleuroparenchymal scarring at the right lung base. Borderline hyperinflation. Upper normal heart size with unchanged mediastinal contours. No acute airspace disease. No pulmonary edema or pneumothorax. No definite pleural effusion, allowing for pleuroparenchymal scarring at the right lung base. No acute osseous abnormalities are seen. IMPRESSION: 1. Chronic pleuroparenchymal scarring at the right lung base. No acute abnormality. 2. Borderline hyperinflation. Electronically Signed   By: Keith Rake M.D.   On: 12/14/2018 01:08    Assessment and Plan:   1. Chest pain - in the setting of extremely high BP - however, he reports exertional symptoms recently as well. - sx improved w/ Nitro, which also improved his BP - hard to sort out if the BP or CAD caused the CP - feel we need to be definitive about CAD, since he has hx CM, if EF is still low, we need to know if it is NICM or ICM - The risks and benefits of a cardiac catheterization including, but not limited to, death, stroke, MI, kidney damage and bleeding were discussed with the patient who indicates understanding and agrees to proceed.  - ck lipids, TSH, A1c  2. HTN - pt had been off his meds for at least a month. - BP very high on admit, improving - IM is restarting his home meds - encouraged him to get a home BP cuff and make sure his BP is controlled   Active Problems:   Chest pain     For questions or updates, please contact [facility] Please consult [email or web address] for contact info under Cardiology/STEMI.   Signed, Rosaria Ferries, PA-C  12/14/2018 7:37 AM  Personally seen and examined. Agree with above.   58 year old male former patient of Dr. Wynonia Lawman here with shortness of breath, chest discomfort with previously diagnosed cardiomyopathy EF 35 to 40% in 2015 in the setting of right lower lobe empyema with surgical drainage.  Came in with severely elevated blood pressure, could not catch his breath, chest heaviness severe.  Never been that bad previously.  Felt some sweating.  Feels like he developed a mild cough.  Had some left hand tingling and shoulder tingling.  Had a prior TIA several years in the past.  He also experienced over the past 2 weeks or so some possible exertional component chest pain.  He is here currently in the emergency room with first troponin high-sensitivity negative.  EMS blood pressure was severely elevated 214/130.  Nitroglycerin administered, with decrease in blood pressure, chest pain improved.  After Suanne Marker examined him, I entered the room, noticed that his blood pressure once again was in the [id number] systolic and he stated that he was having some more of that chest pressure that he was describing previously.  Denies any fevers chills vomiting melena syncope.  GEN: Well nourished, well developed, in no acute distress  HEENT: normal  Neck: no JVD, carotid bruits, or masses Cardiac: RRR; no murmurs, rubs, or gallops,no edema  Respiratory:  clear to auscultation bilaterally except for decreased base, right, normal work of breathing GI: soft, nontender, nondistended, + BS MS: no deformity or atrophy  Skin: warm and dry, no rash Neuro:  Alert and Oriented x 3, Strength and sensation are intact Psych: euthymic mood, full affect  Troponin normal. EKG personally reviewed shows accentuation of T wave inversion especially in the lateral precordial leads.  Echo 2015-EF 35 to 40%  Assessment and plan:  Unstable angina -May be result of hypertensive urgency however he certainly could have  underlying severe coronary artery disease resulting in the symptoms as well. -We will optimize his blood pressure control.  Appreciate hospitalist team.  -T wave changes are accentuated on ECG. -I think given his constellation of symptoms and recent exertional component of his symptoms, getting a heart catheterization makes sense.  Risks and benefits have been explained including stroke heart attack death renal impairment bleeding.  He is willing to proceed. -Curious to see the state of his ejection fraction.  Since he has been out of his medications, it is plausible that his EF has diminished.  It is unclear whether or not his EF normalized.  Apparently he did have a stress echocardiogram about 2 years ago with Dr. Wynonia Lawman that he remembers as being unremarkable.  Prior dilated cardiomyopathy -Prior EF in 2015 35 to 40%.  Reevaluate.  Echocardiogram.  Cardiac catheterization.  Question hypertensive etiology.  Prior TIA -Currently no evidence of stroke.  Hypertensive heart disease -We will go ahead and give him his carvedilol, other antihypertensives and metoprolol 5 IV here in the ER and start Nitropaste.  Candee Furbish, MD

## 2018-12-14 NOTE — H&P (View-Only) (Signed)
Cardiology Consultation:   Patient ID: Frank Solomon; [id number]; 1960/07/04   Admit date: 12/14/2018 Date of Consult: 12/14/2018  Primary Care Provider: Loretha Brasil, FNP Primary Cardiologist: Candee Furbish, MD [place], was Dr Wynonia Lawman Primary Electrophysiologist:  None   Patient Profile:   Frank Solomon is a 58 y.o. 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  History of Present Illness:   Mr. Galas had been seeing Dr Wynonia Lawman every 6 months. Last stress test was 2 yr ago or so.  He has been out of his BP medications x 1 month or more. Dr Wynonia Lawman retired and PCP left the practice. Saw new PCP last week.   He does not exercise, has sciatica. No previous hx of exertional sx. However, has been getting some chest pressure when he walks up hills for the last couple of weeks.   Last pm, he laid down and suddenly could not catch his breath. Had chest heaviness 8/10, has never been that bad before. He was sweating, no N&V. He sat up, that helped a little but he still could not breathe well. No change w/ deep inspiration. He also had a cough. Took cough medicine, no other rx.   Cough medicine did not help. L hand started tingling, all the way up to his shoulder. Had tingling with the TIA, but otherwise no sx reminded him of that.   He called 911. He took ASA 650 mg. EMS gave him SL NTG x 2, that helped the pain and his breathing. BP initially 214/130. Pain went to a 3-4/10. ER gave him NTG paste, pain resolved.    Past Medical History:  Diagnosis Date  . 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 CHF (congestive heart failure) ([place])   . TIA (transient ischemic attack)     Past Surgical History:  Procedure Laterality Date  . CARDIAC CATHETERIZATION  1999   Dr Wynonia Lawman. per pt, was ok  . VIDEO ASSISTED  THORACOSCOPY (VATS)/DECORTICATION Right 09/07/2013   Procedure: VIDEO ASSISTED THORACOSCOPY (VATS)/ DRAINAGE OF EMPYEMA/ DECORTICATION;  Surgeon: Melrose Nakayama, MD;  Location: [facility];  Service: Thoracic;  Laterality: Right;     Prior to Admission medications   Medication Sig Start Date End Date Taking? Authorizing Provider  amLODipine (NORVASC) 5 MG tablet TAKE 1 TABLET BY MOUTH  DAILY 04/29/18  Yes Richardo Priest, MD  carvedilol (COREG) 12.5 MG tablet TAKE 1 TABLET BY MOUTH TWO  TIMES DAILY 04/29/18  Yes Richardo Priest, MD  hydrALAZINE (APRESOLINE) 25 MG tablet TAKE 1 TABLET BY MOUTH TWO  TIMES DAILY 04/29/18  Yes Richardo Priest, MD  quinapril (ACCUPRIL) 40 MG tablet TAKE 1 TABLET BY MOUTH  DAILY Patient taking differently: Take 40 mg by mouth daily.  04/29/18  Yes Richardo Priest, MD  cyclobenzaprine (FLEXERIL) 10 MG tablet Take 1 tablet (10 mg total) by mouth 3 (three) times daily as needed for muscle spasms. Patient not taking: Reported on 12/14/2018 08/02/14   Street, [place], PA-C  furosemide (LASIX) 20 MG tablet Take 1 tablet (20 mg total) by mouth daily. Patient not taking: Reported on 12/14/2018 09/17/13   Reyne Dumas, MD  HYDROcodone-acetaminophen (NORCO/VICODIN) 5-325 MG per tablet Take 1 tablet by mouth every 6 (six) hours as needed for moderate  pain. Patient not taking: Reported on 12/14/2018 07/19/14   Dalia Heading, PA-C  naproxen (NAPROSYN) 500 MG tablet Take 1 tablet (500 mg total) by mouth 2 (two) times daily as needed for mild pain, moderate pain or headache (TAKE WITH MEALS.). Patient not taking: Reported on 12/14/2018 08/02/14   Street, [place], [place]  oxyCODONE (OXY IR/ROXICODONE) 5 MG immediate release tablet Take 1-2 tablets (5-10 mg total) by mouth every 6 (six) hours as needed for severe pain. Patient not taking: Reported on 07/19/2014 09/28/13   Melrose Nakayama, MD  oxyCODONE-acetaminophen (PERCOCET) 5-325 MG per tablet Take 1 tablet by mouth every 6 (six) hours  as needed for severe pain. Patient not taking: Reported on 12/14/2018 08/02/14   Street, [place], PA-C  pantoprazole (PROTONIX) 40 MG tablet Take 1 tablet (40 mg total) by mouth 2 (two) times daily. Patient not taking: Reported on 07/19/2014 09/17/13   Reyne Dumas, MD  predniSONE (DELTASONE) 50 MG tablet Take 1 tablet (50 mg total) by mouth daily. Patient not taking: Reported on 12/14/2018 07/19/14   Dalia Heading, PA-C  senna (SENOKOT) 8.6 MG TABS tablet Take 1 tablet (8.6 mg total) by mouth daily as needed for mild constipation (when constipated beyond scheduled dose). Patient not taking: Reported on 07/19/2014 09/17/13   Reyne Dumas, MD    Inpatient Medications: Scheduled Meds: . nitroGLYCERIN  1 inch Topical Once   Continuous Infusions:  PRN Meds: nitroGLYCERIN  Allergies:   No Known Allergies  Social History:   Social History   Socioeconomic History  . Marital status: Divorced    Spouse name: Not on file  . Number of children: Not on file  . Years of education: Not on file  . Highest education level: Not on file  Occupational History  . Occupation: [employer], part-time  Social Needs  . Financial resource strain: Not on file  . Food insecurity    Worry: Not on file    Inability: Not on file  . Transportation needs    Medical: Not on file    Non-medical: Not on file  Tobacco Use  . Smoking status: Current Some Day Smoker    Packs/day: 0.10    Types: Cigarettes  . Smokeless tobacco: Never Used  Substance and Sexual Activity  . Alcohol use: No  . Drug use: No  . Sexual activity: Not on file  Lifestyle  . Physical activity    Days per week: Not on file    Minutes per session: Not on file  . Stress: Not on file  Relationships  . Social Herbalist on phone: Not on file    Gets together: Not on file    Attends religious service: Not on file    Active member of club or organization: Not on file    Attends meetings of clubs or organizations: Not on file     Relationship status: Not on file  . Intimate partner violence    Fear of current or ex partner: Not on file    Emotionally abused: Not on file    Physically abused: Not on file    Forced sexual activity: Not on file  Other Topics Concern  . Not on file  Social History Narrative  . Not on file    Family History:   Family History  Problem Relation Age of Onset  . Dementia Mother   . Diabetes Brother    Family Status:  Family Status  Relation Name Status  . Father  Deceased  at age 53       Alcoholism  . Mother  Alive       dementia  . Brother  Alive  . Brother  Alive  . Sister  Alive  . Sister  Deceased       Congenital heart disease  . Sister  Deceased       AIDS    ROS:  Please see the history of present illness.  All other ROS reviewed and negative.     Physical Exam/Data:   Vitals:   12/14/18 0230 12/14/18 0245 12/14/18 0300 12/14/18 0315  BP: (!) 150/97 (!) 139/91 137/88 134/87  Pulse: 78 73 75 86  Resp:    (!) 22  Temp:      TempSrc:      SpO2: 96% 96% 96% 98%   No intake or output data in the 24 hours ending 12/14/18 0737 There were no vitals filed for this visit. There is no height or weight on file to calculate BMI.  General:  Well nourished, well developed, male in no acute distress HEENT: normal Lymph: no adenopathy Neck: JVD - not elevated Endocrine:  No thryomegaly Vascular: No carotid bruits; 4/4 extremity pulses 2+, without bruits  Cardiac:  normal S1, S2; RRR; no murmur Lungs:  clear bilaterally, no wheezing, rhonchi or rales  Abd: soft, nontender, no hepatomegaly  Ext: no edema Musculoskeletal:  No deformities, BUE and BLE strength normal and equal Skin: warm and dry  Neuro:  CNs 2-12 intact, no focal abnormalities noted Psych:  Normal affect   EKG:  The EKG was personally reviewed and demonstrates:  11/16 ECG is SR, HR 90, inferolateral T wave changes are different from 2015 Telemetry:  Telemetry was personally reviewed and  demonstrates:  SR   CV studies:   ECHO: 2015 - Left ventricle: The cavity size was normal. Wall thickness was  increased in a pattern of mild LVH. Systolic function was  moderately reduced. The estimated ejection fraction was in the  range of 35% to 40%. Diffuse hypokinesis. Doppler parameters are  consistent with abnormal left ventricular relaxation (grade 1  diastolic dysfunction).  - Aortic valve: There was no stenosis.  - Mitral valve: There was no significant regurgitation.  - Right ventricle: The cavity size was normal. Systolic function  was normal.  - Pulmonary arteries: No complete TR doppler jet so unable to  estimate PA systolic pressure.  - Inferior vena cava: The vessel was normal in size. The  respirophasic diameter changes were in the normal range (>= 50%),  consistent with normal central venous pressure.  - Pericardium, extracardiac: A trivial pericardial effusion was  identified.   Impressions:   - Normal LV size with mild LV hypertrophy. EF 35-40% with diffuse  hypokinesis. Normal RV size and systolic function. No significant  valvular abnormality noted. Indication for study was sepsis. I do  not see definite endocarditis.    Laboratory Data:   Chemistry Recent Labs  Lab 12/14/18 0214  NA 140  K 3.9  CL 112*  CO2 19*  GLUCOSE 113*  BUN 13  CREATININE 1.03  CALCIUM 8.6*  GFRNONAA >60  GFRAA >60  ANIONGAP 9    Lab Results  Component Value Date   ALT 20 12/14/2018   AST 36 12/14/2018   ALKPHOS 108 12/14/2018   BILITOT 0.9 12/14/2018   Hematology Recent Labs  Lab 12/14/18 0214  WBC 8.1  RBC 4.03*  HGB 13.8  HCT 41.7  MCV 103.5[facility]  34.2*  MCHC 33.1  RDW 14.6  PLT 212   Cardiac Enzymes High Sensitivity Troponin:   Recent Labs  Lab 12/14/18 0214  TROPONINIHS 13      BNP Recent Labs  Lab 12/14/18 0214  BNP 477.3*    TSH: No results found for: TSH Lipids:No results found for: CHOL, HDL, LDLCALC,  LDLDIRECT, TRIG, CHOLHDL HgbA1c:No results found for: HGBA1C Magnesium:  Magnesium  Date Value Ref Range Status  09/13/2013 2.1 1.5 - 2.5 mg/dL Final     Radiology/Studies:  Dg Chest 2 View  Result Date: 12/14/2018 CLINICAL DATA:  Chest pain. Shortness of breath when lying down. Tingling in left hand. EXAM: CHEST - 2 VIEW COMPARISON:  Radiograph 09/28/2013. CT 09/06/2013 FINDINGS: Chronic pleuroparenchymal scarring at the right lung base. Borderline hyperinflation. Upper normal heart size with unchanged mediastinal contours. No acute airspace disease. No pulmonary edema or pneumothorax. No definite pleural effusion, allowing for pleuroparenchymal scarring at the right lung base. No acute osseous abnormalities are seen. IMPRESSION: 1. Chronic pleuroparenchymal scarring at the right lung base. No acute abnormality. 2. Borderline hyperinflation. Electronically Signed   By: Keith Rake M.D.   On: 12/14/2018 01:08    Assessment and Plan:   1. Chest pain - in the setting of extremely high BP - however, he reports exertional symptoms recently as well. - sx improved w/ Nitro, which also improved his BP - hard to sort out if the BP or CAD caused the CP - feel we need to be definitive about CAD, since he has hx CM, if EF is still low, we need to know if it is NICM or ICM - The risks and benefits of a cardiac catheterization including, but not limited to, death, stroke, MI, kidney damage and bleeding were discussed with the patient who indicates understanding and agrees to proceed.  - ck lipids, TSH, A1c  2. HTN - pt had been off his meds for at least a month. - BP very high on admit, improving - IM is restarting his home meds - encouraged him to get a home BP cuff and make sure his BP is controlled   Active Problems:   Chest pain     For questions or updates, please contact [facility] Please consult [email or web address] for contact info under Cardiology/STEMI.   Signed, Rosaria Ferries, PA-C  12/14/2018 7:37 AM  Personally seen and examined. Agree with above.   58 year old male former patient of Dr. Wynonia Lawman here with shortness of breath, chest discomfort with previously diagnosed cardiomyopathy EF 35 to 40% in 2015 in the setting of right lower lobe empyema with surgical drainage.  Came in with severely elevated blood pressure, could not catch his breath, chest heaviness severe.  Never been that bad previously.  Felt some sweating.  Feels like he developed a mild cough.  Had some left hand tingling and shoulder tingling.  Had a prior TIA several years in the past.  He also experienced over the past 2 weeks or so some possible exertional component chest pain.  He is here currently in the emergency room with first troponin high-sensitivity negative.  EMS blood pressure was severely elevated 214/130.  Nitroglycerin administered, with decrease in blood pressure, chest pain improved.  After Suanne Marker examined him, I entered the room, noticed that his blood pressure once again was in the [id number] systolic and he stated that he was having some more of that chest pressure that he was describing previously.  Denies any fevers chills vomiting melena syncope.  GEN: Well nourished, well developed, in no acute distress  HEENT: normal  Neck: no JVD, carotid bruits, or masses Cardiac: RRR; no murmurs, rubs, or gallops,no edema  Respiratory:  clear to auscultation bilaterally except for decreased base, right, normal work of breathing GI: soft, nontender, nondistended, + BS MS: no deformity or atrophy  Skin: warm and dry, no rash Neuro:  Alert and Oriented x 3, Strength and sensation are intact Psych: euthymic mood, full affect  Troponin normal. EKG personally reviewed shows accentuation of T wave inversion especially in the lateral precordial leads.  Echo 2015-EF 35 to 40%  Assessment and plan:  Unstable angina -May be result of hypertensive urgency however he certainly could have  underlying severe coronary artery disease resulting in the symptoms as well. -We will optimize his blood pressure control.  Appreciate hospitalist team.  -T wave changes are accentuated on ECG. -I think given his constellation of symptoms and recent exertional component of his symptoms, getting a heart catheterization makes sense.  Risks and benefits have been explained including stroke heart attack death renal impairment bleeding.  He is willing to proceed. -Curious to see the state of his ejection fraction.  Since he has been out of his medications, it is plausible that his EF has diminished.  It is unclear whether or not his EF normalized.  Apparently he did have a stress echocardiogram about 2 years ago with Dr. Wynonia Lawman that he remembers as being unremarkable.  Prior dilated cardiomyopathy -Prior EF in 2015 35 to 40%.  Reevaluate.  Echocardiogram.  Cardiac catheterization.  Question hypertensive etiology.  Prior TIA -Currently no evidence of stroke.  Hypertensive heart disease -We will go ahead and give him his carvedilol, other antihypertensives and metoprolol 5 IV here in the ER and start Nitropaste.  Candee Furbish, MD

## 2018-12-14 NOTE — ED Provider Notes (Signed)
[facility] EMERGENCY DEPARTMENT Provider Note   CSN: [id number] Arrival date & time: 12/14/18  0005    History   Chief Complaint Shortness of breath  HPI Frank Solomon is a 58 y.o. male.   The history is provided by the patient.  He has history of dilated cardiomyopathy with heart failure, transient ischemic attack and comes in because of an episode of dyspnea and diaphoresis.  He states that at about 9:30 PM, he was laying down when he suddenly felt like he could not breathe and broke out in a sweat.  He sat up in bed and felt somewhat better.  There was associated tingling in his left arm.  There was a heavy feeling in his chest which he rated at 7/10.  There is no nausea or vomiting but he did break out in a sweat.  Nothing seemed to make his symptoms better, nothing made it worse.  He did notice that he had some mild dyspnea on exertion through the day today.  EMS gave him aspirin and nitroglycerin with complete resolution of symptoms except for some mild ongoing tingling in his left hand.  Initial blood pressure was 214/130, came down to 166/100 following nitroglycerin.  He is a cigarette smoker, but denies history of hypertension, diabetes, hyperlipidemia.  He denies family history of premature coronary atherosclerosis.  Past Medical History:  Diagnosis Date  . 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 CHF (congestive heart failure)   . TIA (transient ischemic attack)     Patient Active Problem List   Diagnosis Date Noted  . Acute respiratory failure ([place]) 09/07/2013  . Lung abscess ([place]) 09/07/2013  . Empyema ([place]) 09/07/2013  . Sepsis ([place]) 09/07/2013  . Cardiomyopathy, dilated ([place])   . Hypertensive heart disease     Past Surgical History:  Procedure Laterality Date  . VIDEO ASSISTED THORACOSCOPY (VATS)/DECORTICATION Right 09/07/2013   Procedure: VIDEO ASSISTED THORACOSCOPY (VATS)/ DRAINAGE OF EMPYEMA/ DECORTICATION;   Surgeon: Melrose Nakayama, MD;  Location: [facility];  Service: Thoracic;  Laterality: Right;        Home Medications    Prior to Admission medications   Medication Sig Start Date End Date Taking? Authorizing Provider  acetaminophen (TYLENOL) 500 MG tablet Take 500 mg by mouth every 6 (six) hours as needed for mild pain.    [provider]  amLODipine (NORVASC) 5 MG tablet TAKE 1 TABLET BY MOUTH  DAILY 04/29/18   Richardo Priest, MD  Camphor-Menthol-Methyl Sal (BEN GAY ULTRA STRENGTH) 05-07-28 % CREA Apply 1 application topically as needed (for sore muscles).    [provider]  carvedilol (COREG) 12.5 MG tablet TAKE 1 TABLET BY MOUTH TWO  TIMES DAILY 04/29/18   Richardo Priest, MD  cetirizine (ZYRTEC) 10 MG tablet Take 10 mg by mouth daily.    [provider]  cyclobenzaprine (FLEXERIL) 10 MG tablet Take 1 tablet (10 mg total) by mouth 3 (three) times daily as needed for muscle spasms. 08/02/14   Street, [place], PA-C  furosemide (LASIX) 20 MG tablet Take 1 tablet (20 mg total) by mouth daily. 09/17/13   Reyne Dumas, MD  hydrALAZINE (APRESOLINE) 25 MG tablet TAKE 1 TABLET BY MOUTH TWO  TIMES DAILY 04/29/18   Richardo Priest, MD  HYDROcodone-acetaminophen (NORCO/VICODIN) 5-325 MG per tablet Take 1 tablet by mouth every 6 (six) hours as needed for moderate pain. 07/19/14   Lawyer, Harrell Gave, PA-C  naproxen (NAPROSYN) 500  MG tablet Take 1 tablet (500 mg total) by mouth 2 (two) times daily as needed for mild pain, moderate pain or headache (TAKE WITH MEALS.). 08/02/14   Street, [place], PA-C  oxyCODONE (OXY IR/ROXICODONE) 5 MG immediate release tablet Take 1-2 tablets (5-10 mg total) by mouth every 6 (six) hours as needed for severe pain. Patient not taking: Reported on 07/19/2014 09/28/13   Melrose Nakayama, MD  oxyCODONE-acetaminophen (PERCOCET) 5-325 MG per tablet Take 1 tablet by mouth every 6 (six) hours as needed for severe pain. 08/02/14   Street, [place], PA-C   pantoprazole (PROTONIX) 40 MG tablet Take 1 tablet (40 mg total) by mouth 2 (two) times daily. Patient not taking: Reported on 07/19/2014 09/17/13   Reyne Dumas, MD  predniSONE (DELTASONE) 50 MG tablet Take 1 tablet (50 mg total) by mouth daily. 07/19/14   Lawyer, Harrell Gave, PA-C  quinapril (ACCUPRIL) 40 MG tablet TAKE 1 TABLET BY MOUTH  DAILY 04/29/18   Richardo Priest, MD  senna (SENOKOT) 8.6 MG TABS tablet Take 1 tablet (8.6 mg total) by mouth daily as needed for mild constipation (when constipated beyond scheduled dose). Patient not taking: Reported on 07/19/2014 09/17/13   Reyne Dumas, MD    Family History Family History  Problem Relation Age of Onset  . Dementia Mother   . Diabetes Brother     Social History Social History   Tobacco Use  . Smoking status: Current Some Day Smoker    Packs/day: 0.10    Types: Cigarettes  . Smokeless tobacco: Never Used  Substance Use Topics  . Alcohol use: No  . Drug use: No     Allergies   Patient has no known allergies.   Review of Systems Review of Systems  All other systems reviewed and are negative.    Physical Exam Updated Vital Signs BP (!) 163/101 (BP Location: Right Arm)   Pulse 87   Temp 98.1 F (36.7 C) (Oral)   Resp 18   SpO2 96%   Physical Exam Vitals signs and nursing note reviewed.    58 year old male, resting comfortably and in no acute distress. Vital signs are significant for elevated blood pressure. Oxygen saturation is 96%, which is normal. Head is normocephalic and atraumatic. PERRLA, EOMI. Oropharynx is clear. Neck is nontender and supple without adenopathy.  JVD is present at 45 degrees. Back is nontender and there is no CVA tenderness. Lungs are clear without rales, wheezes, or rhonchi. Chest is nontender. Heart has regular rate and rhythm without murmur. Abdomen is soft, flat, nontender without masses or hepatosplenomegaly and peristalsis is normoactive. Extremities have trace edema, full range  of motion is present. Skin is warm and dry without rash. Neurologic: Mental status is normal, cranial nerves are intact, there are no motor or sensory deficits.  There is no pronator drift.  There is no extinction on double simultaneous stimulation.  ED Treatments / Results  Labs (all labs ordered are listed, but only abnormal results are displayed) Labs Reviewed  COMPREHENSIVE METABOLIC PANEL - Abnormal; Notable for the following components:      Result Value   Chloride 112 (*)    CO2 19 (*)    Glucose, Bld 113 (*)    Calcium 8.6 (*)    Total Protein 6.1 (*)    Albumin 3.2 (*)    All other components within normal limits  BRAIN NATRIURETIC PEPTIDE - Abnormal; Notable for the following components:   B Natriuretic Peptide 477.3 (*)  All other components within normal limits  CBC WITH DIFFERENTIAL/PLATELET - Abnormal; Notable for the following components:   RBC 4.03 (*)    MCV 103.5 (*)    MCH 34.2 (*)    All other components within normal limits  SARS CORONAVIRUS 2 (TAT 6-24 HRS)  TROPONIN I (HIGH SENSITIVITY)    EKG EKG Interpretation  Date/Time:  Monday December 14 2018 00:06:49 EST Ventricular Rate:  90 PR Interval:  168 QRS Duration: 104 QT Interval:  378 QTC Calculation: 462 R Axis:   52 Text Interpretation: Normal sinus rhythm Cannot rule out Anterior infarct , age undetermined T wave abnormality, consider inferolateral ischemia Abnormal ECG When compared with ECG of 09/06/2013, T wave abnormality has worsened. Confirmed by Delora Fuel ([id number]) on 12/14/2018 12:10:48 AM   EKG Interpretation  Date/Time:  Monday December 14 2018 06:02:21 EST Ventricular Rate:  77 PR Interval:  168 QRS Duration: 108 QT Interval:  405 QTC Calculation: 459 R Axis:   42 Text Interpretation: Sinus rhythm Probable left atrial enlargement Nonspecific T abnormalities, lateral leads When compared with ECG of EARLIER SAME DATE T wave inversion is slightly more pronounced Confirmed by  Delora Fuel ([id number]) on 12/14/2018 6:05:53 AM       Radiology Dg Chest 2 View  Result Date: 12/14/2018 CLINICAL DATA:  Chest pain. Shortness of breath when lying down. Tingling in left hand. EXAM: CHEST - 2 VIEW COMPARISON:  Radiograph 09/28/2013. CT 09/06/2013 FINDINGS: Chronic pleuroparenchymal scarring at the right lung base. Borderline hyperinflation. Upper normal heart size with unchanged mediastinal contours. No acute airspace disease. No pulmonary edema or pneumothorax. No definite pleural effusion, allowing for pleuroparenchymal scarring at the right lung base. No acute osseous abnormalities are seen. IMPRESSION: 1. Chronic pleuroparenchymal scarring at the right lung base. No acute abnormality. 2. Borderline hyperinflation. Electronically Signed   By: Keith Rake M.D.   On: 12/14/2018 01:08    Procedures Procedures   Medications Ordered in ED Medications  nitroGLYCERIN (NITROSTAT) SL tablet 0.4 mg (has no administration in time range)  nitroGLYCERIN (NITROGLYN) 2 % ointment 1 inch (has no administration in time range)   Initial Impression / Assessment and Plan / ED Course  I have reviewed the triage vital signs and the nursing notes.  Pertinent labs & imaging results that were available during my care of the patient were reviewed by me and considered in my medical decision making (see chart for details).  Episode of chest discomfort and dyspnea very concerning for angina or ACS.  ECG does show some T wave inversions in the inferior and anterolateral leads which are slightly more pronounced than prior ECG.  Heart score is 6, he will need hospital admission.  Old records are reviewed, and he has no relevant past visits.  6:06 AM Troponin is normal, BNP is slightly elevated.  Macrocytosis is noted without anemia.  Metabolic panel is unremarkable.  Chest pain has recurred.  ECG is repeated showing minimal worsening of T wave inversion.  He will be given sublingual and topical  nitroglycerin.  Heart score is 6 which puts him at an elevated risk of major adverse cardiac events in the next 6 weeks.  Case has been discussed with Dr. Hal Hope of [facility] hospitalist who requests we discussed with cardiologist to see if they will admit primarily.  He had recurrence of chest pain.  ECG during pain showed no new changes.  Pain was once again relieved with nitroglycerin and he is placed on topical nitrates.  Case  is discussed with Dr. Marlou Porch cardiology service who requests patient be admitted to hospitalist service.  Final Clinical Impressions(s) / ED Diagnoses   Final diagnoses:  Nonspecific chest pain  Elevated brain natriuretic peptide (BNP) level    ED Discharge Orders    None       Delora Fuel, MD [id number]

## 2018-12-14 NOTE — Interval H&P Note (Signed)
History and Physical Interval Note:  12/14/2018 1:12 PM  Frank Solomon  has presented today for surgery, with the diagnosis of unstable angina.  The various methods of treatment have been discussed with the patient and family. After consideration of risks, benefits and other options for treatment, the patient has consented to  Procedure(s): LEFT HEART CATH AND CORONARY ANGIOGRAPHY (N/A) as a surgical intervention.  The patient's history has been reviewed, patient examined, no change in status, stable for surgery.  I have reviewed the patient's chart and labs.  Questions were answered to the patient's satisfaction.     Sherren Mocha

## 2018-12-14 NOTE — H&P (Signed)
History and Physical    Frank Solomon [id number] DOB: 1960/08/17 DOA: 12/14/2018  PCP: Loretha Brasil, FNP   Patient coming from: Home  Chief Complaint: Chest pain  HPI: Frank Solomon is a 58 y.o. male with medical history significant for hypertension, history of TIA in 1999 with nonischemic cardiomyopathy in Q000111Q chronic systolic and diastolic congestive heart failure with LVEF 35-40% in TTE 2015 presented to the ER for evaluation of chest pain.  Patient reports he has been out of his BP medication for a month.  Last night around 9 PM he started having chest heaviness in the middle of the chest 8 out of 10 in severity while he was laying down with associated sweating, shortness of breath but no nausea and vomiting.  He had some radiation of the pain to the left arm with left arm tingling sensation.  He had mild cough.  Took some cough medication without relief of the pain.  Call 911, took aspirin and EMS gave him nitroglycerin x2 which helped his pain and breathing. His blood pressure initially 214/130. Patient denies any fever, body ache, focal weakness abdominal pain vomiting urinary symptoms.  No known Covid exposure. Patient otherwise denies any nausea, vomiting, fever, chills, cough focal weakness  In ED: Blood Pressure Q000111Q to Q000111Q systolic, saturating 0000000 on room air, initial troponin negative at 13, BNP 477, EKG personally reviewed shows sinus rhythm with inferolateral T wave changes different, from 2015. Covid 19 was pending.  He has chest discomfort 1-2 out of 10 currently  Review of Systems: All systems were reviewed and were negative except as mentioned in HPI above. Positive for chest pain Negative for fever, chills negative for weight loss   Past Medical History:  Diagnosis Date   Cardiomyopathy, dilated ([place])    Diagnosed 1999 had biopsy and also VT   CHF (congestive heart failure) ([place])    TIA (transient ischemic attack)     Past Surgical History:  Procedure  Laterality Date   CARDIAC CATHETERIZATION  1999   Dr Wynonia Lawman. per pt, was ok   VIDEO ASSISTED THORACOSCOPY (VATS)/DECORTICATION Right 09/07/2013   Procedure: VIDEO ASSISTED THORACOSCOPY (VATS)/ DRAINAGE OF EMPYEMA/ DECORTICATION;  Surgeon: Melrose Nakayama, MD;  Location: [facility];  Service: Thoracic;  Laterality: Right;     reports that he has been smoking cigarettes. He has been smoking about 0.10 packs per day. He has never used smokeless tobacco. He reports that he does not drink alcohol or use drugs.  No Known Allergies  Family History  Problem Relation Age of Onset   Dementia Mother    Diabetes Brother      Prior to Admission medications   Medication Sig Start Date End Date Taking? Authorizing Provider  amLODipine (NORVASC) 5 MG tablet TAKE 1 TABLET BY MOUTH  DAILY 04/29/18  Yes Richardo Priest, MD  carvedilol (COREG) 12.5 MG tablet TAKE 1 TABLET BY MOUTH TWO  TIMES DAILY 04/29/18  Yes Richardo Priest, MD  hydrALAZINE (APRESOLINE) 25 MG tablet TAKE 1 TABLET BY MOUTH TWO  TIMES DAILY 04/29/18  Yes Richardo Priest, MD  quinapril (ACCUPRIL) 40 MG tablet TAKE 1 TABLET BY MOUTH  DAILY Patient taking differently: Take 40 mg by mouth daily.  04/29/18  Yes Richardo Priest, MD  furosemide (LASIX) 20 MG tablet Take 1 tablet (20 mg total) by mouth daily. Patient not taking: Reported on 12/14/2018 09/17/13 12/14/18  Reyne Dumas, MD  pantoprazole (PROTONIX) 40 MG tablet Take 1 tablet (40 mg  total) by mouth 2 (two) times daily. Patient not taking: Reported on 07/19/2014 09/17/13 12/14/18  Reyne Dumas, MD    Physical Exam: Vitals:   12/14/18 0315 12/14/18 0740 12/14/18 0745 12/14/18 0815  BP: 134/87 (!) 174/110 (!) 170/116 (!) 176/119  Pulse: 86 79 76 75  Resp: (!) 22 (!) 22 19 19   Temp:      TempSrc:      SpO2: 98% 96% 94% 93%    Constitutional: NAD, calm, comfortable Vitals:   12/14/18 0315 12/14/18 0740 12/14/18 0745 12/14/18 0815  BP: 134/87 (!) 174/110 (!) 170/116 (!) 176/119   Pulse: 86 79 76 75  Resp: (!) 22 (!) 22 19 19   Temp:      TempSrc:      SpO2: 98% 96% 94% 93%    General exam: AAO,NAD, obese HEENT:Oral mucosa moist, Ear/Nose WNL grossly, dentition normal. Respiratory system: Bilaterally clear breath sounds with no wheezing or crackles, NT,no use of accessory muscle Cardiovascular system: S1 & S2 +, No JVD, regular RR. Gastrointestinal system: Abdomen soft, NT,ND, BS+ Nervous System:Alert, awake, moving extremities and grossly nonfocal Extremities: No edema, distal peripheral pulses palpable.  Skin: No rashes,no icterus. MSK: Normal muscle bulk,tone, power  Labs on Admission: I have personally reviewed following labs and imaging studies  CBC: Recent Labs  Lab 12/14/18 0214  WBC 8.1  NEUTROABS 5.5  HGB 13.8  HCT 41.7  MCV 103.5*  PLT 99991111   Basic Metabolic Panel: Recent Labs  Lab 12/14/18 0214  NA 140  K 3.9  CL 112*  CO2 19*  GLUCOSE 113*  BUN 13  CREATININE 1.03  CALCIUM 8.6*   GFR: CrCl cannot be calculated (Unknown ideal weight.). Liver Function Tests: Recent Labs  Lab 12/14/18 0214  AST 36  ALT 20  ALKPHOS 108  BILITOT 0.9  PROT 6.1*  ALBUMIN 3.2*   No results for input(s): LIPASE, AMYLASE in the last 168 hours. No results for input(s): AMMONIA in the last 168 hours. Coagulation Profile: Recent Labs  Lab 12/14/18 0757  INR 1.0   Cardiac Enzymes: No results for input(s): CKTOTAL, CKMB, CKMBINDEX, TROPONINI in the last 168 hours. BNP (last 3 results) No results for input(s): PROBNP in the last 8760 hours. HbA1C: Recent Labs    12/14/18 0745  HGBA1C 6.3*   CBG: No results for input(s): GLUCAP in the last 168 hours. Lipid Profile: No results for input(s): CHOL, HDL, LDLCALC, TRIG, CHOLHDL, LDLDIRECT in the last 72 hours. Thyroid Function Tests: No results for input(s): TSH, T4TOTAL, FREET4, T3FREE, THYROIDAB in the last 72 hours. Anemia Panel: No results for input(s): VITAMINB12, FOLATE, FERRITIN,  TIBC, IRON, RETICCTPCT in the last 72 hours. Urine analysis:    Component Value Date/Time   COLORURINE AMBER (A) 09/07/2013 0121   APPEARANCEUR CLEAR 09/07/2013 0121   LABSPEC <1.005 (L) 09/07/2013 0121   PHURINE 6.0 09/07/2013 0121   GLUCOSEU NEGATIVE 09/07/2013 0121   HGBUR NEGATIVE 09/07/2013 0121   BILIRUBINUR SMALL (A) 09/07/2013 0121   KETONESUR NEGATIVE 09/07/2013 0121   PROTEINUR 30 (A) 09/07/2013 0121   UROBILINOGEN >8.0 (H) 09/07/2013 0121   NITRITE POSITIVE (A) 09/07/2013 0121   LEUKOCYTESUR NEGATIVE 09/07/2013 0121    Radiological Exams on Admission: Dg Chest 2 View  Result Date: 12/14/2018 CLINICAL DATA:  Chest pain. Shortness of breath when lying down. Tingling in left hand. EXAM: CHEST - 2 VIEW COMPARISON:  Radiograph 09/28/2013. CT 09/06/2013 FINDINGS: Chronic pleuroparenchymal scarring at the right lung base. Borderline hyperinflation. Upper normal  heart size with unchanged mediastinal contours. No acute airspace disease. No pulmonary edema or pneumothorax. No definite pleural effusion, allowing for pleuroparenchymal scarring at the right lung base. No acute osseous abnormalities are seen. IMPRESSION: 1. Chronic pleuroparenchymal scarring at the right lung base. No acute abnormality. 2. Borderline hyperinflation. Electronically Signed   By: Keith Rake M.D.   On: 12/14/2018 01:08     Assessment/Plan  Chest pain: Presentation concerning for unstable angina but given uncontrolled hypertension could be related to hypertensive urgency.  Will admit the patient , cardio has been consulted and discussedi with Dr. Waynetta Sandy to optimize blood pressure medication.  Patient also endorses exertional symptoms 2 weeks for presentation and given these anticipating further invasive cardiac work-up.  Discussed with cardiologist.  Check serial troponin hold off on anticoagulation unless troponin bumped up, cont Lopressor as needed.  cont ASA 81, check fasting lipid profile.  NICM  hx/chronic systolic and diastolic CHF with last EF of 35 to 40% in 2015: Obtaining echocardiogram to evaluate.  Has not been taking his medication for 1 month,not on diuretics  HTN  Benign: Fairly controlled, starting Lopressor IV, carvedilol- will need to optimize blood pressure regimen per cardio-pending cardiac cath/echo.  History of TIA takes 325 aspirin at home:   Metabolic acidosis bicarb at 19, repeat BMP in a.m., renal function is stable.  Severity of Illness: The appropriate patient status for this patient is OBSERVATION. Observation status is judged to be reasonable and necessary in order to provide the required intensity of service to ensure the patient's safety. The patient's presenting symptoms, physical exam findings, and initial radiographic and laboratory data in the context of their medical condition is felt to place them at decreased risk for further clinical deterioration. Furthermore, it is anticipated that the patient will be medically stable for discharge from the hospital within 2 midnights of admission. The following factors support the patient status of observation: Chest pain, uncontrolled hypertension   DVT prophylaxis: Lovenox Code Status: Full code Family Communication: Admission, patients condition and plan of care including tests being ordered have been discussed with the patient and indicate understanding and agree with the plan and Code Status.  Consults called:  Antonieta Pert MD [facility] Pager [phone]  If 7PM-7AM, please contact night-coverage [email or web address]  12/14/2018, 8:53 AM

## 2018-12-14 NOTE — Progress Notes (Signed)
  Echocardiogram 2D Echocardiogram has been performed.  Frank Solomon 12/14/2018, 10:41 AM

## 2018-12-14 NOTE — ED Notes (Signed)
The pt reports that whenever he lies backward he chokes and cannot breathe  No pain

## 2018-12-14 NOTE — ED Notes (Signed)
Pt reports 7/10 throbbing pain in his chest/shoulder region. Requesting pain meds.

## 2018-12-15 ENCOUNTER — Encounter (HOSPITAL_COMMUNITY): Payer: Self-pay | Admitting: Cardiovascular Disease

## 2018-12-15 ENCOUNTER — Other Ambulatory Visit: Payer: Self-pay

## 2018-12-15 DIAGNOSIS — I10 Essential (primary) hypertension: Secondary | ICD-10-CM

## 2018-12-15 DIAGNOSIS — Z8673 Personal history of transient ischemic attack (TIA), and cerebral infarction without residual deficits: Secondary | ICD-10-CM | POA: Diagnosis not present

## 2018-12-15 DIAGNOSIS — R7303 Prediabetes: Secondary | ICD-10-CM

## 2018-12-15 DIAGNOSIS — R072 Precordial pain: Secondary | ICD-10-CM | POA: Diagnosis not present

## 2018-12-15 DIAGNOSIS — I428 Other cardiomyopathies: Secondary | ICD-10-CM | POA: Diagnosis not present

## 2018-12-15 LAB — GLUCOSE, CAPILLARY
Glucose-Capillary: 125 mg/dL — ABNORMAL HIGH (ref 70–99)
Glucose-Capillary: 116 mg/dL — ABNORMAL HIGH (ref 70–99)

## 2018-12-15 LAB — HIV ANTIBODY (ROUTINE TESTING W REFLEX): HIV Screen 4th Generation wRfx: NONREACTIVE — AB

## 2018-12-15 MED ORDER — LIVING WELL WITH DIABETES BOOK
Freq: Once | Status: AC
  Administered 2018-12-15: 16:00:00
  Filled 2018-12-15: qty 1

## 2018-12-15 MED ORDER — CARVEDILOL 25 MG PO TABS
25.0000 mg | ORAL_TABLET | Freq: Two times a day (BID) | ORAL | Status: DC
  Administered 2018-12-15 – 2018-12-18 (×7): 25 mg via ORAL
  Filled 2018-12-15 (×7): qty 1

## 2018-12-15 MED ORDER — LISINOPRIL 10 MG PO TABS: 10.0000 mg | ORAL_TABLET | Freq: Every day | ORAL | Status: DC

## 2018-12-15 MED ORDER — HYDRALAZINE HCL 25 MG PO TABS: 25.0000 mg | ORAL_TABLET | Freq: Two times a day (BID) | ORAL | Status: DC

## 2018-12-15 MED ORDER — PNEUMOCOCCAL VAC POLYVALENT 25 MCG/0.5ML IJ INJ
0.5000 mL | INJECTION | INTRAMUSCULAR | Status: AC
  Administered 2018-12-16: 0.5 mL via INTRAMUSCULAR
  Filled 2018-12-15: qty 0.5

## 2018-12-15 MED ORDER — SACUBITRIL-VALSARTAN 24-26 MG PO TABS
1.0000 | ORAL_TABLET | Freq: Two times a day (BID) | ORAL | Status: DC
  Administered 2018-12-15 – 2018-12-16 (×3): 1 via ORAL
  Filled 2018-12-15 (×3): qty 1

## 2018-12-15 MED ORDER — INFLUENZA VAC SPLIT QUAD 0.5 ML IM SUSY
0.5000 mL | PREFILLED_SYRINGE | INTRAMUSCULAR | Status: AC
  Administered 2018-12-16: 12:00:00 0.5 mL via INTRAMUSCULAR
  Filled 2018-12-15: qty 0.5

## 2018-12-15 MED ORDER — INSULIN ASPART 100 UNIT/ML ~~LOC~~ SOLN
0.0000 [IU] | Freq: Three times a day (TID) | SUBCUTANEOUS | Status: DC
  Administered 2018-12-15 – 2018-12-17 (×4): 1 [IU] via SUBCUTANEOUS

## 2018-12-15 MED ORDER — ASPIRIN EC 81 MG PO TBEC
81.0000 mg | DELAYED_RELEASE_TABLET | Freq: Every day | ORAL | Status: DC
  Administered 2018-12-15 – 2018-12-18 (×4): 81 mg via ORAL
  Filled 2018-12-15 (×4): qty 1

## 2018-12-15 MED ORDER — BLOOD PRESSURE CONTROL BOOK
Freq: Once | Status: AC
  Administered 2018-12-15: 16:00:00
  Filled 2018-12-15: qty 1

## 2018-12-15 NOTE — Progress Notes (Signed)
PROGRESS NOTE    Frank Solomon  [id number]  DOB: January 23, 1961  DOA: 12/14/2018 PCP: Loretha Brasil, FNP  Brief Narrative:  58 y.o. male with medical history significant for hypertension, history of TIA in 1999 with nonischemic cardiomyopathy in Q000111Q chronic systolic and diastolic congestive heart failure with LVEF 35-40% in TTE 2015 presented to the ER for evaluation of chest pain requiring asa and nitro x2 by EMS.  Patient reports he has been out of his BP medication for a month. In ED: Blood Pressure Q000111Q to Q000111Q systolic, saturating 0000000 on room air, initial troponin negative at 13, BNP 477, EKG sinus rhythm with inferolateral T wave changes different, from 2015. Covid 19 was negative. Patient admitted to hospitalist service on 11/16, cardiology consulted and patient underwent cardiac cath on 11/16 showing mild non obstructive CAD and unchanged EF at 35%  Subjective:  Patient resting comfortably. Denies any chest pain. Seen by cardiology foe follow up. Saturating well on RA. Denies any leg swellings.  Objective: Vitals:   12/14/18 1847 12/14/18 1902 12/14/18 1955 12/15/18 0540  BP: (!) 153/90 (!) 159/100 (!) 141/86 (!) 145/95  Pulse: 78 77 81 75  Resp:      Temp:   98.2 F (36.8 C) 98.1 F (36.7 C)  TempSrc:   Oral Oral  SpO2: 99% 99% 99% 100%  Weight:    104.4 kg  Height:        Intake/Output Summary (Last 24 hours) at 12/15/2018 1246 Last data filed at 12/14/2018 1956 Gross per 24 hour  Intake 300 ml  Output 475 ml  Net -175 ml   Filed Weights   12/14/18 0928 12/15/18 0540  Weight: 108.7 kg 104.4 kg    Physical Examination:  General exam: Appears calm and comfortable  Respiratory system: Clear to auscultation. Respiratory effort normal. Cardiovascular system: S1 & S2 heard, RRR. No JVD, murmurs, rubs, gallops or clicks. No pedal edema. Gastrointestinal system: Abdomen is nondistended, soft and nontender. No organomegaly or masses felt. Normal bowel sounds  heard. Central nervous system: Alert and oriented. No focal neurological deficits. Extremities: Symmetric 5 x 5 power. Skin: No rashes, lesions or ulcers Psychiatry: Judgement and insight appear normal. Mood & affect appropriate.     Data Reviewed: I have personally reviewed following labs and imaging studies  CBC: Recent Labs  Lab 12/14/18 0214 12/14/18 1656  WBC 8.1 8.4  NEUTROABS 5.5  --   HGB 13.8 14.0  HCT 41.7 42.2  MCV 103.5* 100.7*  PLT 212 0000000   Basic Metabolic Panel: Recent Labs  Lab 12/14/18 0214 12/14/18 1656  NA 140  --   K 3.9  --   CL 112*  --   CO2 19*  --   GLUCOSE 113*  --   BUN 13  --   CREATININE 1.03 0.88  CALCIUM 8.6*  --    GFR: Estimated Creatinine Clearance: 115.7 mL/min (by C-G formula based on SCr of 0.88 mg/dL). Liver Function Tests: Recent Labs  Lab 12/14/18 0214  AST 36  ALT 20  ALKPHOS 108  BILITOT 0.9  PROT 6.1*  ALBUMIN 3.2*   No results for input(s): LIPASE, AMYLASE in the last 168 hours. No results for input(s): AMMONIA in the last 168 hours. Coagulation Profile: Recent Labs  Lab 12/14/18 0757  INR 1.0   Cardiac Enzymes: No results for input(s): CKTOTAL, CKMB, CKMBINDEX, TROPONINI in the last 168 hours. BNP (last 3 results) No results for input(s): PROBNP in the last 8760 hours. HbA1C:  Recent Labs    12/14/18 0745  HGBA1C 6.3*   CBG: No results for input(s): GLUCAP in the last 168 hours. Lipid Profile: Recent Labs    12/14/18 0822  CHOL 205*  HDL 46  LDLCALC 146*  TRIG 65  CHOLHDL 4.5   Thyroid Function Tests: Recent Labs    12/14/18 0745  TSH 0.694   Anemia Panel: No results for input(s): VITAMINB12, FOLATE, FERRITIN, TIBC, IRON, RETICCTPCT in the last 72 hours. Sepsis Labs: No results for input(s): PROCALCITON, LATICACIDVEN in the last 168 hours.  Recent Results (from the past 240 hour(s))  SARS Coronavirus 2 by RT PCR (hospital order, performed in [facility] hospital lab) Nasopharyngeal  Nasopharyngeal Swab     Status: None   Collection Time: 12/14/18  7:59 AM   Specimen: Nasopharyngeal Swab  Result Value Ref Range Status   SARS Coronavirus 2 NEGATIVE NEGATIVE Final    Comment: (NOTE) If result is NEGATIVE SARS-CoV-2 target nucleic acids are NOT DETECTED. The SARS-CoV-2 RNA is generally detectable in upper and lower  respiratory specimens during the acute phase of infection. The lowest  concentration of SARS-CoV-2 viral copies this assay can detect is 250  copies / mL. A negative result does not preclude SARS-CoV-2 infection  and should not be used as the sole basis for treatment or other  patient management decisions.  A negative result may occur with  improper specimen collection / handling, submission of specimen other  than nasopharyngeal swab, presence of viral mutation(s) within the  areas targeted by this assay, and inadequate number of viral copies  (<250 copies / mL). A negative result must be combined with clinical  observations, patient history, and epidemiological information. If result is POSITIVE SARS-CoV-2 target nucleic acids are DETECTED. The SARS-CoV-2 RNA is generally detectable in upper and lower  respiratory specimens dur ing the acute phase of infection.  Positive  results are indicative of active infection with SARS-CoV-2.  Clinical  correlation with patient history and other diagnostic information is  necessary to determine patient infection status.  Positive results do  not rule out bacterial infection or co-infection with other viruses. If result is PRESUMPTIVE POSTIVE SARS-CoV-2 nucleic acids MAY BE PRESENT.   A presumptive positive result was obtained on the submitted specimen  and confirmed on repeat testing.  While 2019 novel coronavirus  (SARS-CoV-2) nucleic acids may be present in the submitted sample  additional confirmatory testing may be necessary for epidemiological  and / or clinical management purposes  to differentiate between   SARS-CoV-2 and other Sarbecovirus currently known to infect humans.  If clinically indicated additional testing with an alternate test  methodology [phone]) is advised. The SARS-CoV-2 RNA is generally  detectable in upper and lower respiratory sp ecimens during the acute  phase of infection. The expected result is Negative. Fact Sheet for Patients:  StrictlyIdeas.no Fact Sheet for Healthcare Providers: BankingDealers.co.za This test is not yet approved or cleared by the [place] FDA and has been authorized for detection and/or diagnosis of SARS-CoV-2 by FDA under an Emergency Use Authorization (EUA).  This EUA will remain in effect (meaning this test can be used) for the duration of the COVID-19 declaration under Section 564(b)(1) of the Act, 21 U.S.C. section 360bbb-3(b)(1), unless the authorization is terminated or revoked sooner. Performed at [facility], [place] [address], [place], [place] [address]       Radiology Studies: Dg Chest 2 View  Result Date: 12/14/2018 CLINICAL DATA:  Chest pain. Shortness  of breath when lying down. Tingling in left hand. EXAM: CHEST - 2 VIEW COMPARISON:  Radiograph 09/28/2013. CT 09/06/2013 FINDINGS: Chronic pleuroparenchymal scarring at the right lung base. Borderline hyperinflation. Upper normal heart size with unchanged mediastinal contours. No acute airspace disease. No pulmonary edema or pneumothorax. No definite pleural effusion, allowing for pleuroparenchymal scarring at the right lung base. No acute osseous abnormalities are seen. IMPRESSION: 1. Chronic pleuroparenchymal scarring at the right lung base. No acute abnormality. 2. Borderline hyperinflation. Electronically Signed   By: Keith Rake M.D.   On: 12/14/2018 01:08        Scheduled Meds:  atorvastatin  80 mg Oral q1800   carvedilol  25 mg Oral BID   heparin  5,000 Units Subcutaneous Q8H   pantoprazole  40 mg Oral  Daily   sacubitril-valsartan  1 tablet Oral BID   sodium chloride flush  3 mL Intravenous Q12H   sodium chloride flush  3 mL Intravenous Q12H   Continuous Infusions:  sodium chloride      Assessment & Plan:    1. Chest pain secondary to Hypertensive urgency --in the setting of not taking BP meds at home (was prescribed amlodipine 5 mg daily, hydralazine 25 mg BID, quinapril 40 mg daily, Coreg 12.5 mg daily). S/P cardiac cath reveling non obstructive CAD. Cardiology increased coreg to bid dosing and recommended adding Entresto.    2. Non ischemic chronic systolic cardiomyopathy: States ran out of meds a month back, otherwise compliant. Repeat Echo with systolic CM, unchanged EF at 35% and also shows grade 2 diastolic dysfunction likely related to uncontrolled BP. No signs of decompensated CHF and not on diuretics.Cardiology increased coreg to bid dosing and added Entresto--to be monitored overnight to ensure tolerance.  3. H/O TIA: continue asa and statins  4. Hyperlipidemia: on statins  5. ?Borderline Diabetes: HgbA1C at 6.3. Accuchecks ordered. May need diabetic diet education.     DVT prophylaxis: heparin Code Status: full code Family / Patient Communication: d/w patient Disposition Plan: home in am if cleared by cardiology     LOS: 0 days    Time spent: 25 minutes    Guilford Shi, MD [facility] Pager [phone]  If 7PM-7AM, please contact night-coverage [email or web address] Password [facility] 12/15/2018, 12:46 PM

## 2018-12-15 NOTE — Care Management Obs Status (Signed)
[facility] NOTIFICATION   Patient Details  Name: Frank Solomon MRN: [id number] Date of Birth: August 13, 1960   Medicare Observation Status Notification Given:  Yes    Gerrianne Scale Shatia Sindoni, LCSW 12/15/2018, 4:23 PM

## 2018-12-15 NOTE — Plan of Care (Signed)
  Problem: Cardiac: Goal: Ability to achieve and maintain adequate cardiovascular perfusion will improve Outcome: Completed/Met   Problem: Cardiovascular: Goal: Ability to achieve and maintain adequate cardiovascular perfusion will improve Outcome: Completed/Met Goal: Vascular access site(s) Level 0-1 will be maintained Outcome: Completed/Met

## 2018-12-15 NOTE — Progress Notes (Addendum)
Progress Note  Patient Name: Frank Solomon Date of Encounter: 12/15/2018  Primary Cardiologist: Candee Furbish, MD   Subjective   Patient reports pressure behind his eyes when he stands up. No CP or SOB.   Inpatient Medications    Scheduled Meds: . atorvastatin  80 mg Oral q1800  . carvedilol  12.5 mg Oral BID  . heparin  5,000 Units Subcutaneous Q8H  . pantoprazole  40 mg Oral Daily  . sodium chloride flush  3 mL Intravenous Q12H  . sodium chloride flush  3 mL Intravenous Q12H   Continuous Infusions: . sodium chloride     PRN Meds: sodium chloride, acetaminophen, metoprolol tartrate, nitroGLYCERIN, ondansetron (ZOFRAN) IV, sodium chloride, sodium chloride flush   Vital Signs    Vitals:   12/14/18 1847 12/14/18 1902 12/14/18 1955 12/15/18 0540  BP: (!) 153/90 (!) 159/100 (!) 141/86 (!) 145/95  Pulse: 78 77 81 75  Resp:      Temp:   98.2 F (36.8 C) 98.1 F (36.7 C)  TempSrc:   Oral Oral  SpO2: 99% 99% 99% 100%  Weight:    104.4 kg  Height:        Intake/Output Summary (Last 24 hours) at 12/15/2018 [id number] Last data filed at 12/14/2018 1956 Gross per 24 hour  Intake 300 ml  Output 475 ml  Net -175 ml   Last 3 Weights 12/15/2018 12/14/2018 08/02/2014  Weight (lbs) 230 lb 1.6 oz 239 lb 9.6 oz 225 lb  Weight (kg) 104.373 kg 108.682 kg 102.059 kg      Telemetry    NSR, HR 80-90s, 4 beats NSVT - Personally Reviewed  ECG    No new - Personally Reviewed  Physical Exam   GEN: No acute distress.   Neck: No JVD Cardiac: RRR, no murmurs, rubs, or gallops.  Respiratory: mild wheeze B/L GI: Soft, nontender, non-distended  MS: No edema; No deformity. Neuro:  Nonfocal  Psych: Normal affect   Labs    High Sensitivity Troponin:   Recent Labs  Lab 12/14/18 0214 12/14/18 0757 12/14/18 1656  TROPONINIHS 13 13 15       Chemistry Recent Labs  Lab 12/14/18 0214 12/14/18 1656  NA 140  --   K 3.9  --   CL 112*  --   CO2 19*  --   GLUCOSE 113*  --   BUN  13  --   CREATININE 1.03 0.88  CALCIUM 8.6*  --   PROT 6.1*  --   ALBUMIN 3.2*  --   AST 36  --   ALT 20  --   ALKPHOS 108  --   BILITOT 0.9  --   GFRNONAA >60 >60  GFRAA >60 >60  ANIONGAP 9  --      Hematology Recent Labs  Lab 12/14/18 0214 12/14/18 1656  WBC 8.1 8.4  RBC 4.03* 4.19*  HGB 13.8 14.0  HCT 41.7 42.2  MCV 103.5* 100.7*  MCH 34.2* 33.4  MCHC 33.1 33.2  RDW 14.6 14.8  PLT 212 188    BNP Recent Labs  Lab 12/14/18 0214  BNP 477.3*     DDimer No results for input(s): DDIMER in the last 168 hours.   Radiology    Dg Chest 2 View  Result Date: 12/14/2018 CLINICAL DATA:  Chest pain. Shortness of breath when lying down. Tingling in left hand. EXAM: CHEST - 2 VIEW COMPARISON:  Radiograph 09/28/2013. CT 09/06/2013 FINDINGS: Chronic pleuroparenchymal scarring at the right lung base. Borderline hyperinflation.  Upper normal heart size with unchanged mediastinal contours. No acute airspace disease. No pulmonary edema or pneumothorax. No definite pleural effusion, allowing for pleuroparenchymal scarring at the right lung base. No acute osseous abnormalities are seen. IMPRESSION: 1. Chronic pleuroparenchymal scarring at the right lung base. No acute abnormality. 2. Borderline hyperinflation. Electronically Signed   By: Keith Rake M.D.   On: 12/14/2018 01:08    Cardiac Studies   Echo 12/14/18 1. Left ventricular ejection fraction, by visual estimation, is 30 to 35%. The left ventricle has severely decreased function. There is no left ventricular hypertrophy.  2. Left ventricular diastolic parameters are consistent with Grade II diastolic dysfunction (pseudonormalization).  3. Moderately dilated left ventricular internal cavity size.  4. The left ventricle demonstrates global hypokinesis.  5. Global right ventricle has normal systolic function.The right ventricular size is normal. No increase in right ventricular wall thickness.  6. Left atrial size was mildly  dilated.  7. Right atrial size was not well visualized.  8. Trivial pericardial effusion is present.  9. The mitral valve is normal in structure. Trace mitral valve regurgitation. 10. The tricuspid valve is normal in structure. Tricuspid valve regurgitation is trivial. 11. The aortic valve is normal in structure. Aortic valve regurgitation is not visualized. No evidence of aortic valve sclerosis or stenosis. 12. The pulmonic valve was grossly normal. Pulmonic valve regurgitation is trivial. 13. Aortic dilatation noted. 14. There is mild dilatation of the ascending aorta measuring 37 mm. 15. TR signal is inadequate for assessing pulmonary artery systolic pressure. 16. The inferior vena cava is normal in size with greater than 50% respiratory variability, suggesting right atrial pressure of 3 mmHg  Cardiac Cath 12/14/18 1. Patent coronary arteries with mild nonobstructive CAD 2. Moderately elevated LVEDP (87mmHg)  Recommend: medical therapy for HTN/CHF Coronary Diagrams  Diagnostic Dominance: Right      Patient Profile     58 y.o. male with a hx of HTN, NICM 1999 (dx TIA) with negative stress echo, chronic combined systolic and diastolic CHF who was seen for chest pain in the setting up elevated BP.   Assessment & Plan    Chest Pain/Hypertensive Urgency Patient presented with chest pain in the setting of severely elevated BP, systolics up to A999333, but also reporting exertional symptoms. Improved with NTG. Patient had not taken medications for 1 month.  Restarted home med Coreg 12.5 mg BID. Patient has a hx of dilated CM, EF 35-40% in 2015. Cath was performed to evaluate ischemic cause. - As outpatient patient was on amlodipine 5 mg daily, hydralazine 25 mg BID, quinapril 40 mg daily, Coreg 12.5 mg daily. Coreg restarted on admission. - Cath showed nonobstructive CAD -  Repeat echo showed EF 30-35%, G2DD - Patient has not had further episodes of chest pain. - Pressures are still  elevated. Can increase coreg vs adding entresto. Creatinine 0.88. MD to see  Nonobstructive CAD - per cath yesterday - continue with medical management and lifestyle changes - continue BB and statin. Can add daily aspirin  Cardiomyopathy/Chronic combined systolic and diastolic HF - Hx of dilated CM EF 35-40% in 2015. Echo this admission showing EF 30-35% - At home patient was on lasix 20 mg daily, BB, quinapril 40 mg daily. Patient has not had meds in 1 month.  - continue BB. Consider adding entresto.    For questions or updates, please contact [place] Please consult [email or web address] for contact info under        Signed, Cadence Ninfa Meeker,  PA-C  12/15/2018, 6:37 AM    Personally seen and examined. Agree with above.   No further chest pain, cath yesterday showed nonobstructive CAD, nonischemic cardiomyopathy EF 35% on echo.  He has been out of his medications.  Hypertensive cardiomyopathy.  Assessment and plan:  Nonischemic cardiomyopathy/hypertensive cardiomyopathy/nonobstructive CAD -Continue with aggressive secondary risk factor prevention.  I will increase his carvedilol to 25 twice a day -- start Entresto 24/26 BID  Let's keep him here while titration meds today.  Candee Furbish, MD

## 2018-12-16 ENCOUNTER — Observation Stay (HOSPITAL_COMMUNITY): Payer: Medicare Other

## 2018-12-16 DIAGNOSIS — Z8673 Personal history of transient ischemic attack (TIA), and cerebral infarction without residual deficits: Secondary | ICD-10-CM | POA: Diagnosis not present

## 2018-12-16 DIAGNOSIS — R7303 Prediabetes: Secondary | ICD-10-CM | POA: Diagnosis present

## 2018-12-16 DIAGNOSIS — Z9114 Patient's other noncompliance with medication regimen: Secondary | ICD-10-CM | POA: Diagnosis not present

## 2018-12-16 DIAGNOSIS — I5042 Chronic combined systolic (congestive) and diastolic (congestive) heart failure: Secondary | ICD-10-CM | POA: Diagnosis present

## 2018-12-16 DIAGNOSIS — F17210 Nicotine dependence, cigarettes, uncomplicated: Secondary | ICD-10-CM | POA: Diagnosis present

## 2018-12-16 DIAGNOSIS — Z7952 Long term (current) use of systemic steroids: Secondary | ICD-10-CM | POA: Diagnosis not present

## 2018-12-16 DIAGNOSIS — R072 Precordial pain: Secondary | ICD-10-CM | POA: Diagnosis not present

## 2018-12-16 DIAGNOSIS — I428 Other cardiomyopathies: Secondary | ICD-10-CM | POA: Diagnosis not present

## 2018-12-16 DIAGNOSIS — Z20828 Contact with and (suspected) exposure to other viral communicable diseases: Secondary | ICD-10-CM | POA: Diagnosis present

## 2018-12-16 DIAGNOSIS — I110 Hypertensive heart disease with heart failure: Secondary | ICD-10-CM | POA: Diagnosis present

## 2018-12-16 DIAGNOSIS — H538 Other visual disturbances: Secondary | ICD-10-CM

## 2018-12-16 DIAGNOSIS — I420 Dilated cardiomyopathy: Secondary | ICD-10-CM | POA: Diagnosis present

## 2018-12-16 DIAGNOSIS — R7989 Other specified abnormal findings of blood chemistry: Secondary | ICD-10-CM | POA: Diagnosis present

## 2018-12-16 DIAGNOSIS — E785 Hyperlipidemia, unspecified: Secondary | ICD-10-CM | POA: Diagnosis present

## 2018-12-16 DIAGNOSIS — Z79891 Long term (current) use of opiate analgesic: Secondary | ICD-10-CM | POA: Diagnosis not present

## 2018-12-16 DIAGNOSIS — Z79899 Other long term (current) drug therapy: Secondary | ICD-10-CM | POA: Diagnosis not present

## 2018-12-16 DIAGNOSIS — Z23 Encounter for immunization: Secondary | ICD-10-CM | POA: Diagnosis present

## 2018-12-16 DIAGNOSIS — E872 Acidosis: Secondary | ICD-10-CM | POA: Diagnosis present

## 2018-12-16 DIAGNOSIS — I160 Hypertensive urgency: Secondary | ICD-10-CM | POA: Diagnosis present

## 2018-12-16 DIAGNOSIS — I25110 Atherosclerotic heart disease of native coronary artery with unstable angina pectoris: Secondary | ICD-10-CM | POA: Diagnosis present

## 2018-12-16 DIAGNOSIS — I10 Essential (primary) hypertension: Secondary | ICD-10-CM | POA: Diagnosis not present

## 2018-12-16 LAB — BASIC METABOLIC PANEL
Anion gap: 10 (ref 5–15)
BUN: 9 mg/dL (ref 6–20)
CO2: 23 mmol/L (ref 22–32)
Calcium: 8.8 mg/dL — ABNORMAL LOW (ref 8.9–10.3)
Chloride: 108 mmol/L (ref 98–111)
Creatinine, Ser: 0.9 mg/dL (ref 0.61–1.24)
GFR calc Af Amer: >60 mL/min (ref >60)
GFR calc non Af Amer: >60 mL/min (ref >60)
Glucose, Bld: 117 mg/dL — ABNORMAL HIGH (ref 70–99)
Potassium: 3.7 mmol/L (ref 3.5–5.1)
Sodium: 141 mmol/L (ref 135–145)

## 2018-12-16 LAB — GLUCOSE, CAPILLARY
Glucose-Capillary: 137 mg/dL — ABNORMAL HIGH (ref 70–99)
Glucose-Capillary: 137 mg/dL — ABNORMAL HIGH (ref 70–99)
Glucose-Capillary: 110 mg/dL — ABNORMAL HIGH (ref 70–99)
Glucose-Capillary: 127 mg/dL — ABNORMAL HIGH (ref 70–99)

## 2018-12-16 MED ORDER — FUROSEMIDE 20 MG PO TABS
20.0000 mg | ORAL_TABLET | Freq: Every day | ORAL | Status: DC
  Administered 2018-12-16 – 2018-12-18 (×3): 20 mg via ORAL
  Filled 2018-12-16 (×4): qty 1

## 2018-12-16 MED ORDER — SACUBITRIL-VALSARTAN 49-51 MG PO TABS
1.0000 | ORAL_TABLET | Freq: Two times a day (BID) | ORAL | Status: DC
  Administered 2018-12-16: 22:00:00 1 via ORAL
  Filled 2018-12-16 (×2): qty 1

## 2018-12-16 MED ORDER — SPIRONOLACTONE 25 MG PO TABS
25.0000 mg | ORAL_TABLET | Freq: Every day | ORAL | Status: DC
  Administered 2018-12-16 – 2018-12-18 (×3): 25 mg via ORAL
  Filled 2018-12-16 (×3): qty 1

## 2018-12-16 MED ORDER — HYDRALAZINE HCL 25 MG PO TABS: 25.0000 mg | ORAL_TABLET | Freq: Three times a day (TID) | ORAL | Status: DC

## 2018-12-16 NOTE — Progress Notes (Addendum)
Progress Note  Patient Name: Frank Solomon Date of Encounter: 12/16/2018  Primary Cardiologist: Candee Furbish, MD   Subjective   Patient is feeling better today. No headache or CP. He has been walking down the halls with no symptoms.   Inpatient Medications    Scheduled Meds: . aspirin EC  81 mg Oral Daily  . atorvastatin  80 mg Oral q1800  . carvedilol  25 mg Oral BID  . heparin  5,000 Units Subcutaneous Q8H  . influenza vac split quadrivalent PF  0.5 mL Intramuscular Tomorrow-1000  . insulin aspart  0-9 Units Subcutaneous TID WC  . pantoprazole  40 mg Oral Daily  . pneumococcal 23 valent vaccine  0.5 mL Intramuscular Tomorrow-1000  . sacubitril-valsartan  1 tablet Oral BID  . sodium chloride flush  3 mL Intravenous Q12H  . sodium chloride flush  3 mL Intravenous Q12H   Continuous Infusions: . sodium chloride     PRN Meds: sodium chloride, acetaminophen, metoprolol tartrate, nitroGLYCERIN, ondansetron (ZOFRAN) IV, sodium chloride, sodium chloride flush   Vital Signs    Vitals:   12/15/18 2059 12/16/18 0354 12/16/18 0356 12/16/18 0400  BP: (!) 142/95 (!) 138/94 (!) 141/89   Pulse: 68 67 70   Resp: 17 17    Temp: 98.5 F (36.9 C) 98.6 F (37 C)    TempSrc: Oral Oral    SpO2: 97% 100%    Weight:    103.1 kg  Height:        Intake/Output Summary (Last 24 hours) at 12/16/2018 0751 Last data filed at 12/15/2018 2105 Gross per 24 hour  Intake 246 ml  Output -  Net 246 ml   Last 3 Weights 12/16/2018 12/15/2018 12/14/2018  Weight (lbs) 227 lb 4.7 oz 230 lb 1.6 oz 239 lb 9.6 oz  Weight (kg) 103.1 kg 104.373 kg 108.682 kg      Telemetry    NSR, HR 70-80s, 5 beats NSVT - Personally Reviewed  ECG    No new - Personally Reviewed  Physical Exam   GEN: No acute distress.   Neck: No JVD Cardiac: RRR, no murmurs, rubs, or gallops.  Respiratory: Clear to auscultation bilaterally. GI: Soft, nontender, non-distended  MS: No edema; No deformity. Neuro:   Nonfocal  Psych: Normal affect   Labs    High Sensitivity Troponin:   Recent Labs  Lab 12/14/18 0214 12/14/18 0757 12/14/18 1656  TROPONINIHS 13 13 15       Chemistry Recent Labs  Lab 12/14/18 0214 12/14/18 1656  NA 140  --   K 3.9  --   CL 112*  --   CO2 19*  --   GLUCOSE 113*  --   BUN 13  --   CREATININE 1.03 0.88  CALCIUM 8.6*  --   PROT 6.1*  --   ALBUMIN 3.2*  --   AST 36  --   ALT 20  --   ALKPHOS 108  --   BILITOT 0.9  --   GFRNONAA >60 >60  GFRAA >60 >60  ANIONGAP 9  --      Hematology Recent Labs  Lab 12/14/18 0214 12/14/18 1656  WBC 8.1 8.4  RBC 4.03* 4.19*  HGB 13.8 14.0  HCT 41.7 42.2  MCV 103.5* 100.7*  MCH 34.2* 33.4  MCHC 33.1 33.2  RDW 14.6 14.8  PLT 212 188    BNP Recent Labs  Lab 12/14/18 0214  BNP 477.3*     DDimer No results for input(s): DDIMER  in the last 168 hours.   Radiology    No results found.  Cardiac Studies   Echo 12/14/18 1. Left ventricular ejection fraction, by visual estimation, is 30 to 35%. The left ventricle has severely decreased function. There is no left ventricular hypertrophy. 2. Left ventricular diastolic parameters are consistent with Grade II diastolic dysfunction (pseudonormalization). 3. Moderately dilated left ventricular internal cavity size. 4. The left ventricle demonstrates global hypokinesis. 5. Global right ventricle has normal systolic function.The right ventricular size is normal. No increase in right ventricular wall thickness. 6. Left atrial size was mildly dilated. 7. Right atrial size was not well visualized. 8. Trivial pericardial effusion is present. 9. The mitral valve is normal in structure. Trace mitral valve regurgitation. 10. The tricuspid valve is normal in structure. Tricuspid valve regurgitation is trivial. 11. The aortic valve is normal in structure. Aortic valve regurgitation is not visualized. No evidence of aortic valve sclerosis or stenosis. 12. The  pulmonic valve was grossly normal. Pulmonic valve regurgitation is trivial. 13. Aortic dilatation noted. 14. There is mild dilatation of the ascending aorta measuring 37 mm. 15. TR signal is inadequate for assessing pulmonary artery systolic pressure. 16. The inferior vena cava is normal in size with greater than 50% respiratory variability, suggesting right atrial pressure of 3 mmHg  Cardiac Cath 12/14/18 1. Patent coronary arteries with mild nonobstructive CAD 2. Moderately elevated LVEDP (12mmHg)  Recommend: medical therapy for HTN/CHF Coronary Diagrams  Diagnostic Dominance: Right      Patient Profile     58 y.o. male with a hx of HTN, NICM 1999 (dx TIA) with negative stress echo, chronic combined systolic and diastolic CHF who was seen for chest pain in the setting up elevated BP.  Assessment & Plan    Chest Pain/Hypertensive Urgency Patient presented with chest pain in the setting of severely elevated BP, systolics up to A999333, but also reporting exertional symptoms. Improved with NTG. Patient had not taken medications for 1 month.  Restarted home med Coreg 12.5 mg BID. Patient has a hx of dilated CM, EF 35-40% in 2015. Cath was performed to evaluate ischemic cause. - As outpatient patient was on amlodipine 5 mg daily, hydralazine 25 mg BID, quinapril 40 mg daily, Coreg 12.5 mg daily. Coreg restarted on admission. - Cath showed nonobstructive CAD - echo showed EF 30-35%, G2DD - Patient has not had further episodes of chest pain. - Pressures are still slightly elevated. Can titrate medications. Creatinine so far stable. MD to see - Possible discharge today>>will arrange for follow-up  Nonobstructive CAD - per cath yesterday - continue with medical management and lifestyle changes - continue BB and statin.   Cardiomyopathy/Chronic combined systolic and diastolic HF - Hx of dilated CM EF 35-40% in 2015. Echo this admission showing EF 30-35% - At home patient was on  lasix 20 mg daily, BB, quinapril 40 mg daily. Patient has not had meds in 1 month.  - continue BB and Entresto - Patient is on Lasix 20 mg daily at home (has not been on it here in the hospital)  For questions or updates, please contact [place] Please consult [email or web address] for contact info under        Signed, Cadence Ninfa Meeker, PA-C  12/16/2018, 7:51 AM    Personally seen and examined. Agree with above.   Blood pressure still not optimized. -I have increased his Entresto to 49/51 twice daily-medium dose -I will also start spironolactone 25 mg once a day. -Restarting his furosemide  20 mg once a day.  He is to take this at home.  This should assist with LVEDP of 22 which is also elevated because of hypertension.  -Check basic metabolic profile tomorrow.  Clinically, he is not having any chest discomfort, no significant shortness of breath, however he is saying that he is having some blurred vision bilaterally.  Spoke with Dr. Reesa Chew.  He will order an MRI. ? PRES syndrome.  Moves all extremities well.  Good grip strength.  Speaking clearly.  Candee Furbish, MD

## 2018-12-16 NOTE — Progress Notes (Signed)
PROGRESS NOTE    Frank Solomon  [id number] DOB: 01-18-1961 DOA: 12/14/2018 PCP: Loretha Brasil, FNP   Brief Narrative:  58 year old with history of HTN, TIA, nonischemic cardiomyopathy, reduced ejection fraction CHF 30-35% came to the ER with chest pain.  EKG showed inferolateral T wave changes.  Left heart cath on 11/16 showed mild nonobstructive CAD with unchanged EF 35%.   Assessment & Plan:   Active Problems:   NICM (nonischemic cardiomyopathy) (HCC)   Chest pain   HTN (hypertension), benign   History of TIA (transient ischemic attack)   Atypical chest pain secondary to hypertensive urgency Nonobstructive coronary artery disease -Noncompliant with home medications.  Left heart cath on 11/16-none obstructive CAD -Aspirin and statin  Blurry Vision b/l= No other focal neuro deficits. Will order MRI Brain without contrast to rule out CVA and eval for PRES in the setting on uncontrolled BP.   Congestive heart failure with reduced ejection fraction, ejection fraction [id number], grade 2 diastolic dysfunction -Coreg increased to twice daily dosing.  Entresto in creased. Added Aladatone and Lasix. Follow routine lab work  Spoke with Dr Gillian Shields, appreciate his input  History of TIA -On aspirin and statin  Hyperlipidemia -On high intensity statin Lipitor 80 mg.  LDL elevated at 146.  Prediabetes -Hemoglobin A1c 6.3.  Diabetic education.   DVT prophylaxis: Subcu heparin Code Status: Full code Family Communication:  None Disposition Plan: Maintain hosp stay to ensure his blurry vision has been addressed and evaluated in the meantime cardiac medications being adjusted.   Consultants:   Cardiology    Subjective: Tells me he has having b/l blurry vision starting this morning. Unable to read the reading clearly written on the wall.   Review of Systems Otherwise negative except as per HPI, including: General: Denies fever, chills, night sweats or unintended weight loss. Resp:  Denies cough, wheezing, shortness of breath. Cardiac: Denies chest pain, palpitations, orthopnea, paroxysmal nocturnal dyspnea. GI: Denies abdominal pain, nausea, vomiting, diarrhea or constipation GU: Denies dysuria, frequency, hesitancy or incontinence MS: Denies muscle aches, joint pain or swelling Neuro: + blurry vision. Denies headache, Psych: Denies anxiety, depression, SI/HI/AVH Skin: Denies new rashes or lesions ID: Denies sick contacts, exotic exposures, travel  Objective: Vitals:   12/15/18 2059 12/16/18 0354 12/16/18 0356 12/16/18 0400  BP: (!) 142/95 (!) 138/94 (!) 141/89   Pulse: 68 67 70   Resp: 17 17    Temp: 98.5 F (36.9 C) 98.6 F (37 C)    TempSrc: Oral Oral    SpO2: 97% 100%    Weight:    103.1 kg  Height:        Intake/Output Summary (Last 24 hours) at 12/16/2018 0815 Last data filed at 12/15/2018 2105 Gross per 24 hour  Intake 246 ml  Output -  Net 246 ml   Filed Weights   12/14/18 0928 12/15/18 0540 12/16/18 0400  Weight: 108.7 kg 104.4 kg 103.1 kg    Examination:  General exam: Appears calm and comfortable  Respiratory system: Clear to auscultation. Respiratory effort normal. Cardiovascular system: S1 & S2 heard, RRR. No JVD, murmurs, rubs, gallops or clicks. No pedal edema. Gastrointestinal system: Abdomen is nondistended, soft and nontender. No organomegaly or masses felt. Normal bowel sounds heard. Central nervous system: Alert and oriented. + blurry vision.  Extremities: Symmetric 5 x 5 power. Skin: No rashes, lesions or ulcers Psychiatry: Judgement and insight appear normal. Mood & affect appropriate.     Data Reviewed:   CBC: Recent Labs  Lab 12/14/18 0214 12/14/18 1656  WBC 8.1 8.4  NEUTROABS 5.5  --   HGB 13.8 14.0  HCT 41.7 42.2  MCV 103.5* 100.7*  PLT 212 0000000   Basic Metabolic Panel: Recent Labs  Lab 12/14/18 0214 12/14/18 1656  NA 140  --   K 3.9  --   CL 112*  --   CO2 19*  --   GLUCOSE 113*  --   BUN 13  --    CREATININE 1.03 0.88  CALCIUM 8.6*  --    GFR: Estimated Creatinine Clearance: 115 mL/min (by C-G formula based on SCr of 0.88 mg/dL). Liver Function Tests: Recent Labs  Lab 12/14/18 0214  AST 36  ALT 20  ALKPHOS 108  BILITOT 0.9  PROT 6.1*  ALBUMIN 3.2*   No results for input(s): LIPASE, AMYLASE in the last 168 hours. No results for input(s): AMMONIA in the last 168 hours. Coagulation Profile: Recent Labs  Lab 12/14/18 0757  INR 1.0   Cardiac Enzymes: No results for input(s): CKTOTAL, CKMB, CKMBINDEX, TROPONINI in the last 168 hours. BNP (last 3 results) No results for input(s): PROBNP in the last 8760 hours. HbA1C: Recent Labs    12/14/18 0745  HGBA1C 6.3*   CBG: Recent Labs  Lab 12/15/18 1724 12/15/18 2053  GLUCAP 125* 116*   Lipid Profile: Recent Labs    12/14/18 0822  CHOL 205*  HDL 46  LDLCALC 146*  TRIG 65  CHOLHDL 4.5   Thyroid Function Tests: Recent Labs    12/14/18 0745  TSH 0.694   Anemia Panel: No results for input(s): VITAMINB12, FOLATE, FERRITIN, TIBC, IRON, RETICCTPCT in the last 72 hours. Sepsis Labs: No results for input(s): PROCALCITON, LATICACIDVEN in the last 168 hours.  Recent Results (from the past 240 hour(s))  SARS Coronavirus 2 by RT PCR (hospital order, performed in [facility] hospital lab) Nasopharyngeal Nasopharyngeal Swab     Status: None   Collection Time: 12/14/18  7:59 AM   Specimen: Nasopharyngeal Swab  Result Value Ref Range Status   SARS Coronavirus 2 NEGATIVE NEGATIVE Final    Comment: (NOTE) If result is NEGATIVE SARS-CoV-2 target nucleic acids are NOT DETECTED. The SARS-CoV-2 RNA is generally detectable in upper and lower  respiratory specimens during the acute phase of infection. The lowest  concentration of SARS-CoV-2 viral copies this assay can detect is 250  copies / mL. A negative result does not preclude SARS-CoV-2 infection  and should not be used as the sole basis for treatment or other   patient management decisions.  A negative result may occur with  improper specimen collection / handling, submission of specimen other  than nasopharyngeal swab, presence of viral mutation(s) within the  areas targeted by this assay, and inadequate number of viral copies  (<250 copies / mL). A negative result must be combined with clinical  observations, patient history, and epidemiological information. If result is POSITIVE SARS-CoV-2 target nucleic acids are DETECTED. The SARS-CoV-2 RNA is generally detectable in upper and lower  respiratory specimens dur ing the acute phase of infection.  Positive  results are indicative of active infection with SARS-CoV-2.  Clinical  correlation with patient history and other diagnostic information is  necessary to determine patient infection status.  Positive results do  not rule out bacterial infection or co-infection with other viruses. If result is PRESUMPTIVE POSTIVE SARS-CoV-2 nucleic acids MAY BE PRESENT.   A presumptive positive result was obtained on the submitted specimen  and confirmed on repeat  testing.  While 2019 novel coronavirus  (SARS-CoV-2) nucleic acids may be present in the submitted sample  additional confirmatory testing may be necessary for epidemiological  and / or clinical management purposes  to differentiate between  SARS-CoV-2 and other Sarbecovirus currently known to infect humans.  If clinically indicated additional testing with an alternate test  methodology [phone]) is advised. The SARS-CoV-2 RNA is generally  detectable in upper and lower respiratory sp ecimens during the acute  phase of infection. The expected result is Negative. Fact Sheet for Patients:  StrictlyIdeas.no Fact Sheet for Healthcare Providers: BankingDealers.co.za This test is not yet approved or cleared by the [place] FDA and has been authorized for detection and/or diagnosis of SARS-CoV-2 by  FDA under an Emergency Use Authorization (EUA).  This EUA will remain in effect (meaning this test can be used) for the duration of the COVID-19 declaration under Section 564(b)(1) of the Act, 21 U.S.C. section 360bbb-3(b)(1), unless the authorization is terminated or revoked sooner. Performed at [facility], [place] [address], [place], [place] [address]          Radiology Studies: No results found.      Scheduled Meds: . aspirin EC  81 mg Oral Daily  . atorvastatin  80 mg Oral q1800  . carvedilol  25 mg Oral BID  . heparin  5,000 Units Subcutaneous Q8H  . influenza vac split quadrivalent PF  0.5 mL Intramuscular Tomorrow-1000  . insulin aspart  0-9 Units Subcutaneous TID WC  . pantoprazole  40 mg Oral Daily  . pneumococcal 23 valent vaccine  0.5 mL Intramuscular Tomorrow-1000  . sacubitril-valsartan  1 tablet Oral BID  . sodium chloride flush  3 mL Intravenous Q12H  . sodium chloride flush  3 mL Intravenous Q12H   Continuous Infusions: . sodium chloride       LOS: 0 days   Time spent= 35 mins    Sashia Campas Arsenio Loader, MD [facility]  If 7PM-7AM, please contact night-coverage  12/16/2018, 8:15 AM

## 2018-12-16 NOTE — Plan of Care (Signed)
  Problem: Education: Goal: Understanding of cardiac disease, CV risk reduction, and recovery process will improve Outcome: Progressing   Problem: Activity: Goal: Ability to tolerate increased activity will improve Outcome: Progressing   Problem: Activity: Goal: Ability to return to baseline activity level will improve Outcome: Progressing

## 2018-12-16 NOTE — Care Management (Signed)
Per [place] [facility] W/[facility] retail price for [employer] 24-26 mg, 1 tab. 2 times a day,for a 30 day supply $47.00. For mail order w/[facility] 90 day supply co-pay will be $8.95 for  Entresto 1 tab 2 times a day.  No deductible  No PA required Tier 3 medication Retail Pharmacies are : [facility] etc. This pt. has LIS.

## 2018-12-17 LAB — BASIC METABOLIC PANEL
Anion gap: 10 (ref 5–15)
BUN: 10 mg/dL (ref 6–20)
CO2: 23 mmol/L (ref 22–32)
Calcium: 8.7 mg/dL — ABNORMAL LOW (ref 8.9–10.3)
Chloride: 106 mmol/L (ref 98–111)
Creatinine, Ser: 1.3 mg/dL — ABNORMAL HIGH (ref 0.61–1.24)
GFR calc Af Amer: >60 mL/min (ref >60)
GFR calc non Af Amer: >60 mL/min (ref >60)
Glucose, Bld: 190 mg/dL — ABNORMAL HIGH (ref 70–99)
Potassium: 3.7 mmol/L (ref 3.5–5.1)
Sodium: 139 mmol/L (ref 135–145)

## 2018-12-17 LAB — GLUCOSE, CAPILLARY
Glucose-Capillary: 134 mg/dL — ABNORMAL HIGH (ref 70–99)
Glucose-Capillary: 102 mg/dL — ABNORMAL HIGH (ref 70–99)
Glucose-Capillary: 102 mg/dL — ABNORMAL HIGH (ref 70–99)
Glucose-Capillary: 125 mg/dL — ABNORMAL HIGH (ref 70–99)

## 2018-12-17 MED ORDER — SACUBITRIL-VALSARTAN 97-103 MG PO TABS
1.0000 | ORAL_TABLET | Freq: Two times a day (BID) | ORAL | Status: DC
  Administered 2018-12-17 – 2018-12-18 (×3): 1 via ORAL
  Filled 2018-12-17 (×3): qty 1

## 2018-12-17 MED ORDER — CARVEDILOL 25 MG PO TABS: 25.0000 mg | ORAL_TABLET | Freq: Two times a day (BID) | ORAL | 1 refills | Status: DC

## 2018-12-17 MED ORDER — SACUBITRIL-VALSARTAN 97-103 MG PO TABS: 1.0000 | ORAL_TABLET | Freq: Two times a day (BID) | ORAL | 3 refills | Status: DC

## 2018-12-17 MED ORDER — ATORVASTATIN CALCIUM 80 MG PO TABS: 80.0000 mg | ORAL_TABLET | Freq: Every day | ORAL | 1 refills | Status: DC

## 2018-12-17 MED ORDER — FUROSEMIDE 20 MG PO TABS: 20.0000 mg | ORAL_TABLET | Freq: Every day | ORAL | 3 refills | Status: DC

## 2018-12-17 MED ORDER — SACUBITRIL-VALSARTAN 97-103 MG PO TABS: 1.0000 | ORAL_TABLET | Freq: Two times a day (BID) | ORAL | 1 refills | Status: AC

## 2018-12-17 MED ORDER — NITROGLYCERIN 0.4 MG SL SUBL: 0.4000 mg | SUBLINGUAL_TABLET | SUBLINGUAL | 12 refills | Status: DC | PRN

## 2018-12-17 MED ORDER — SPIRONOLACTONE 25 MG PO TABS: 25.0000 mg | ORAL_TABLET | Freq: Every day | ORAL | 3 refills | Status: DC

## 2018-12-17 MED ORDER — PANTOPRAZOLE SODIUM 40 MG PO TBEC: 40.0000 mg | DELAYED_RELEASE_TABLET | Freq: Every day | ORAL | 3 refills | Status: DC

## 2018-12-17 MED ORDER — CARVEDILOL 25 MG PO TABS: 25.0000 mg | ORAL_TABLET | Freq: Two times a day (BID) | ORAL | 3 refills | Status: DC

## 2018-12-17 MED ORDER — ASPIRIN 81 MG PO TBEC: 81.0000 mg | DELAYED_RELEASE_TABLET | Freq: Every day | ORAL | 0 refills | Status: DC

## 2018-12-17 NOTE — Progress Notes (Signed)
Pt experiencing lightheadedness. Orthostatic BP checked and found to be Q000111Q systolic and Q000111Q systolic. MD paged and no new orders received at this time. Will continue to monitor pt.

## 2018-12-17 NOTE — Discharge Summary (Addendum)
Physician Discharge Summary [facility]    Patient: Frank Solomon                   Admit date: 12/14/2018   DOB: Dec 26, 1960             Discharge date:12/17/2018/12:13 PM [id number]                          PCP: Frank Brasil, FNP  Disposition: Home  Recommendations for Outpatient Follow-up:   . Follow up: in 2 week  Discharge Condition: Stable   Code Status:   Code Status: Full Code  Diet recommendation: Cardiac diet , strict diabetic diet   Discharge Diagnoses:    Active Problems:   NICM (nonischemic cardiomyopathy) (HCC)   Chest pain   HTN (hypertension), benign   History of TIA (transient ischemic attack)   Blurred vision    History of Present Illness/ Hospital Course Frank Solomon Summary:   58 year old with history of HTN, TIA, nonischemic cardiomyopathy, reduced ejection fraction CHF 30-35% came to the ER with chest pain.  EKG showed inferolateral T wave changes.  Left heart cath on 11/16 showed mild nonobstructive CAD with unchanged EF 35%.   Discharge discharge summary, A&P     Atypical chest pain secondary to hypertensive urgency Nonobstructive coronary artery disease -Noncompliant with home medications.  Left heart cath on 11/16-none obstructive CAD -Aspirin and statin  Blurry Vision b/l=  -Resolved, no focal neurological findings 12/16/2018 MRI showed no evidence of PRES  Congestive heart failure with reduced ejection fraction, ejection fraction [id number], grade 2 diastolic dysfunction -Coreg increased to twice daily dosing.  Entresto in creased. Added Aladatone and Lasix.  Spoke with Dr Gillian Shields, appreciate his input  Echo:   1. Left ventricular ejection fraction, by visual estimation, is 30 to 35%. The left ventricle has severely decreased function. There is no left ventricular hypertrophy.  History of TIA -On aspirin and statin  Hyperlipidemia -On high intensity statin Lipitor 80 mg.  LDL elevated at 146.  Prediabetes  -Hemoglobin A1c 6.3.  Diabetic education.    Code Status: Full code  Disposition Plan:  To be discharged home. Medications were sent to local pharmacy and to [employer] for affordability reasons.  90-day supply.  Consultants:   Cardiology   MRI Brain: 1. No evidence of acute intracranial abnormality. 2. Small region of encephalomalacia within the left parietal lobe, which may reflect a remote cortically based infarct. 3. Cerebral white matter disease is advanced for age, but likely reflect sequela of chronic small vessel ischemia given provided history of hypertension.       Discharge Instructions:   Discharge Instructions    (HEART FAILURE PATIENTS) Call MD:  Anytime you have any of the following symptoms: 1) 3 pound weight gain in 24 hours or 5 pounds in 1 week 2) shortness of breath, with or without a dry hacking cough 3) swelling in the hands, feet or stomach 4) if you have to sleep on extra pillows at night in order to breathe.   Complete by: As directed    Activity as tolerated - No restrictions   Complete by: As directed    Diet - low sodium heart healthy   Complete by: As directed    Discharge instructions   Complete by: As directed    Please take your medication as instructed.  We are [facility] for 90-day supply please  follow-up with your PCP and cardiologist for further assessment with medications   Increase activity slowly   Complete by: As directed        Medication List    STOP taking these medications   amLODipine 5 MG tablet Commonly known as: NORVASC   cyclobenzaprine 10 MG tablet Commonly known as: FLEXERIL   hydrALAZINE 25 MG tablet Commonly known as: APRESOLINE   HYDROcodone-acetaminophen 5-325 MG tablet Commonly known as: NORCO/VICODIN   naproxen 500 MG tablet Commonly known as: NAPROSYN   oxyCODONE 5 MG immediate release tablet Commonly known as: Oxy IR/ROXICODONE    oxyCODONE-acetaminophen 5-325 MG tablet Commonly known as: Percocet   predniSONE 50 MG tablet Commonly known as: DELTASONE   quinapril 40 MG tablet Commonly known as: ACCUPRIL   senna 8.6 MG Tabs tablet Commonly known as: SENOKOT     TAKE these medications   aspirin 81 MG EC tablet Take 1 tablet (81 mg total) by mouth daily. Start taking on: December 18, 2018   atorvastatin 80 MG tablet Commonly known as: LIPITOR Take 1 tablet (80 mg total) by mouth daily at 6 PM.   carvedilol 25 MG tablet Commonly known as: COREG Take 1 tablet (25 mg total) by mouth 2 (two) times daily. What changed:   medication strength  how much to take   furosemide 20 MG tablet Commonly known as: LASIX Take 1 tablet (20 mg total) by mouth daily.   nitroGLYCERIN 0.4 MG SL tablet Commonly known as: NITROSTAT Place 1 tablet (0.4 mg total) under the tongue every 5 (five) minutes as needed for chest pain (CP or SOB).   pantoprazole 40 MG tablet Commonly known as: PROTONIX Take 1 tablet (40 mg total) by mouth daily. Start taking on: December 18, 2018 What changed: when to take this   sacubitril-valsartan 97-103 MG Commonly known as: ENTRESTO Take 1 tablet by mouth 2 (two) times daily.   spironolactone 25 MG tablet Commonly known as: ALDACTONE Take 1 tablet (25 mg total) by mouth daily. Start taking on: December 18, 2018      Follow-up Information    Jerline Pain, MD Follow up on 12/29/2018.   Specialty: Cardiology Why: Please go to hospital follow-up December 1st at 11:40 AM Contact information: [address] [place] [address] [phone]          No Known Allergies   Procedures /Studies:   Dg Chest 2 View  Result Date: 12/14/2018 CLINICAL DATA:  Chest pain. Shortness of breath when lying down. Tingling in left hand. EXAM: CHEST - 2 VIEW COMPARISON:  Radiograph 09/28/2013. CT 09/06/2013 FINDINGS: Chronic pleuroparenchymal scarring at the right lung  base. Borderline hyperinflation. Upper normal heart size with unchanged mediastinal contours. No acute airspace disease. No pulmonary edema or pneumothorax. No definite pleural effusion, allowing for pleuroparenchymal scarring at the right lung base. No acute osseous abnormalities are seen. IMPRESSION: 1. Chronic pleuroparenchymal scarring at the right lung base. No acute abnormality. 2. Borderline hyperinflation. Electronically Signed   By: Keith Rake M.D.   On: 12/14/2018 01:08   Mr Brain Wo Contrast  Result Date: 12/16/2018 CLINICAL DATA:  TIA, initial exam. Additional history provided: Rule out CVA, concern for PRES. Blurry vision starting this morning, hypertension EXAM: MRI HEAD WITHOUT CONTRAST TECHNIQUE: Multiplanar, multiecho pulse sequences of the brain and surrounding structures were obtained without intravenous contrast. COMPARISON:  Head CT 10/08/1997 FINDINGS: Brain: There is no evidence of acute infarct. No evidence of intracranial mass. No midline  shift or extra-axial fluid collection. No chronic intracranial blood products. Small region of cortical/subcortical encephalomalacia and gliosis within left parietal lobe, which may reflect a remote infarct. Scattered T2/FLAIR hyperintensity within the cerebral white matter is advanced for age, but likely reflects sequela of chronic small vessel ischemia given provided history of hypertension. Cerebral volume is normal for age. Vascular: Flow voids maintained within the proximal large arterial vessels. Skull and upper cervical spine: No focal marrow lesion Sinuses/Orbits: Visualized orbits demonstrate no acute abnormality. Mild scattered paranasal sinus mucosal thickening. Trace fluid within bilateral mastoid air cells. IMPRESSION: 1. No evidence of acute intracranial abnormality. 2. Small region of encephalomalacia within the left parietal lobe, which may reflect a remote cortically based infarct. 3. Cerebral white matter disease is advanced for  age, but likely reflect sequela of chronic small vessel ischemia given provided history of hypertension. Electronically Signed   By: Kellie Simmering DO   On: 12/16/2018 11:50    Subjective:   Patient was seen and examined 12/17/2018, 12:13 PM Patient stable today. No acute distress.  No issues overnight Stable for discharge.  Discharge Exam:    Vitals:   12/16/18 1456 12/16/18 2153 12/17/18 0526 12/17/18 0947  BP: (!) 146/95 (!) 132/92 (!) 137/95 (!) 154/100  Pulse: 74 80 78   Resp: 18     Temp: 98.4 F (36.9 C) 98.4 F (36.9 C) 98.4 F (36.9 C)   TempSrc: Oral Oral Oral   SpO2: 100% 99% 100%   Weight:   103.1 kg   Height:        General: Pt lying comfortably in bed & appears in no obvious distress. Cardiovascular: S1 & S2 heard, RRR, S1/S2 +. No murmurs, rubs, gallops or clicks. No JVD or pedal edema. Respiratory: Clear to auscultation without wheezing, rhonchi or crackles. No increased work of breathing. Abdominal:  Non-distended, non-tender & soft. No organomegaly or masses appreciated. Normal bowel sounds heard. CNS: Alert and oriented. No focal deficits. Extremities: no edema, no cyanosis    The results of significant diagnostics from this hospitalization (including imaging, microbiology, ancillary and laboratory) are listed below for reference.      Microbiology:   Recent Results (from the past 240 hour(s))  SARS Coronavirus 2 by RT PCR (hospital order, performed in [facility] hospital lab) Nasopharyngeal Nasopharyngeal Swab     Status: None   Collection Time: 12/14/18  7:59 AM   Specimen: Nasopharyngeal Swab  Result Value Ref Range Status   SARS Coronavirus 2 NEGATIVE NEGATIVE Final    Comment: (NOTE) If result is NEGATIVE SARS-CoV-2 target nucleic acids are NOT DETECTED. The SARS-CoV-2 RNA is generally detectable in upper and lower  respiratory specimens during the acute phase of infection. The lowest  concentration of SARS-CoV-2 viral copies this assay can  detect is 250  copies / mL. A negative result does not preclude SARS-CoV-2 infection  and should not be used as the sole basis for treatment or other  patient management decisions.  A negative result may occur with  improper specimen collection / handling, submission of specimen other  than nasopharyngeal swab, presence of viral mutation(s) within the  areas targeted by this assay, and inadequate number of viral copies  (<250 copies / mL). A negative result must be combined with clinical  observations, patient history, and epidemiological information. If result is POSITIVE SARS-CoV-2 target nucleic acids are DETECTED. The SARS-CoV-2 RNA is generally detectable in upper and lower  respiratory specimens dur ing the acute phase of infection.  Positive  results are indicative of active infection with SARS-CoV-2.  Clinical  correlation with patient history and other diagnostic information is  necessary to determine patient infection status.  Positive results do  not rule out bacterial infection or co-infection with other viruses. If result is PRESUMPTIVE POSTIVE SARS-CoV-2 nucleic acids MAY BE PRESENT.   A presumptive positive result was obtained on the submitted specimen  and confirmed on repeat testing.  While 2019 novel coronavirus  (SARS-CoV-2) nucleic acids may be present in the submitted sample  additional confirmatory testing may be necessary for epidemiological  and / or clinical management purposes  to differentiate between  SARS-CoV-2 and other Sarbecovirus currently known to infect humans.  If clinically indicated additional testing with an alternate test  methodology [phone]) is advised. The SARS-CoV-2 RNA is generally  detectable in upper and lower respiratory sp ecimens during the acute  phase of infection. The expected result is Negative. Fact Sheet for Patients:  StrictlyIdeas.no Fact Sheet for Healthcare Providers:  BankingDealers.co.za This test is not yet approved or cleared by the [place] FDA and has been authorized for detection and/or diagnosis of SARS-CoV-2 by FDA under an Emergency Use Authorization (EUA).  This EUA will remain in effect (meaning this test can be used) for the duration of the COVID-19 declaration under Section 564(b)(1) of the Act, 21 U.S.C. section 360bbb-3(b)(1), unless the authorization is terminated or revoked sooner. Performed at [facility], [place] [address], [place], [place] [address]      Labs:   CBC: Recent Labs  Lab 12/14/18 0214 12/14/18 1656  WBC 8.1 8.4  NEUTROABS 5.5  --   HGB 13.8 14.0  HCT 41.7 42.2  MCV 103.5* 100.7*  PLT 212 0000000   Basic Metabolic Panel: Recent Labs  Lab 12/14/18 0214 12/14/18 1656 12/16/18 0759 12/17/18 0832  NA 140  --  141 139  K 3.9  --  3.7 3.7  CL 112*  --  108 106  CO2 19*  --  23 23  GLUCOSE 113*  --  117* 190*  BUN 13  --  9 10  CREATININE 1.03 0.88 0.90 1.30*  CALCIUM 8.6*  --  8.8* 8.7*   Liver Function Tests: Recent Labs  Lab 12/14/18 0214  AST 36  ALT 20  ALKPHOS 108  BILITOT 0.9  PROT 6.1*  ALBUMIN 3.2*   BNP (last 3 results) Recent Labs    12/14/18 0214  BNP 477.3*   Cardiac Enzymes: No results for input(s): CKTOTAL, CKMB, CKMBINDEX, TROPONINI in the last 168 hours. CBG: Recent Labs  Lab 12/16/18 0837 12/16/18 1208 12/16/18 1636 12/16/18 2154 12/17/18 0757  GLUCAP 137* 137* 110* 127* 134*  Urinalysis    Component Value Date/Time   COLORURINE AMBER (A) 09/07/2013 0121   APPEARANCEUR CLEAR 09/07/2013 0121   LABSPEC <1.005 (L) 09/07/2013 0121   PHURINE 6.0 09/07/2013 0121   GLUCOSEU NEGATIVE 09/07/2013 0121   HGBUR NEGATIVE 09/07/2013 0121   BILIRUBINUR SMALL (A) 09/07/2013 0121   KETONESUR NEGATIVE 09/07/2013 0121   PROTEINUR 30 (A) 09/07/2013 0121   UROBILINOGEN >8.0 (H) 09/07/2013 0121   NITRITE POSITIVE (A) 09/07/2013 0121   LEUKOCYTESUR  NEGATIVE 09/07/2013 0121    Time coordinating discharge: Over 45 minutes  SIGNED: Deatra James, MD, FACP, FHM. [facility],  Pager [phone]  If 7PM-7AM, please contact night-coverage [email or web address] [email or web address] [facility] 12/17/2018, 12:13 PM

## 2018-12-17 NOTE — Progress Notes (Addendum)
Progress Note  Patient Name: Frank Solomon Date of Encounter: 12/17/2018  Primary Cardiologist: Candee Furbish, MD   Subjective   Pressures slightly elevated. Denies headache or vision problems today. Patient had a twinge of chest pain last night, different from when he came in. He has been walking and feels somewhat winded at the end.  Inpatient Medications    Scheduled Meds:  aspirin EC  81 mg Oral Daily   atorvastatin  80 mg Oral q1800   carvedilol  25 mg Oral BID   furosemide  20 mg Oral Daily   heparin  5,000 Units Subcutaneous Q8H   insulin aspart  0-9 Units Subcutaneous TID WC   pantoprazole  40 mg Oral Daily   sacubitril-valsartan  1 tablet Oral BID   sodium chloride flush  3 mL Intravenous Q12H   sodium chloride flush  3 mL Intravenous Q12H   spironolactone  25 mg Oral Daily   Continuous Infusions:  sodium chloride     PRN Meds: sodium chloride, acetaminophen, metoprolol tartrate, nitroGLYCERIN, ondansetron (ZOFRAN) IV, sodium chloride, sodium chloride flush   Vital Signs    Vitals:   12/16/18 0957 12/16/18 1456 12/16/18 2153 12/17/18 0526  BP: 139/84 (!) 146/95 (!) 132/92 (!) 137/95  Pulse: 71 74 80 78  Resp:  18    Temp:  98.4 F (36.9 C) 98.4 F (36.9 C) 98.4 F (36.9 C)  TempSrc:  Oral Oral Oral  SpO2:  100% 99% 100%  Weight:    103.1 kg  Height:        Intake/Output Summary (Last 24 hours) at 12/17/2018 0742 Last data filed at 12/17/2018 0700 Gross per 24 hour  Intake 708 ml  Output 975 ml  Net -267 ml   Last 3 Weights 12/17/2018 12/16/2018 12/15/2018  Weight (lbs) 227 lb 3.2 oz 227 lb 4.7 oz 230 lb 1.6 oz  Weight (kg) 103.057 kg 103.1 kg 104.373 kg      Telemetry    NSR, HR 70-80s, no other arrhythmias noted - Personally Reviewed  ECG    No new - Personally Reviewed  Physical Exam   GEN: No acute distress.   Neck: No JVD Cardiac: RRR, no murmurs, rubs, or gallops; mild tenderness right upper chest Respiratory: Clear  to auscultation bilaterally. GI: Soft, nontender, non-distended  MS: No edema; No deformity. Neuro:  Nonfocal  Psych: Normal affect   Labs    High Sensitivity Troponin:   Recent Labs  Lab 12/14/18 0214 12/14/18 0757 12/14/18 1656  TROPONINIHS 13 13 15       Chemistry Recent Labs  Lab 12/14/18 0214 12/14/18 1656 12/16/18 0759  NA 140  --  141  K 3.9  --  3.7  CL 112*  --  108  CO2 19*  --  23  GLUCOSE 113*  --  117*  BUN 13  --  9  CREATININE 1.03 0.88 0.90  CALCIUM 8.6*  --  8.8*  PROT 6.1*  --   --   ALBUMIN 3.2*  --   --   AST 36  --   --   ALT 20  --   --   ALKPHOS 108  --   --   BILITOT 0.9  --   --   GFRNONAA >60 >60 >60  GFRAA >60 >60 >60  ANIONGAP 9  --  10     Hematology Recent Labs  Lab 12/14/18 0214 12/14/18 1656  WBC 8.1 8.4  RBC 4.03* 4.19*  HGB 13.8  14.0  HCT 41.7 42.2  MCV 103.5* 100.7*  MCH 34.2* 33.4  MCHC 33.1 33.2  RDW 14.6 14.8  PLT 212 188    BNP Recent Labs  Lab 12/14/18 0214  BNP 477.3*     DDimer No results for input(s): DDIMER in the last 168 hours.   Radiology    Mr Brain Wo Contrast  Result Date: 12/16/2018 CLINICAL DATA:  TIA, initial exam. Additional history provided: Rule out CVA, concern for PRES. Blurry vision starting this morning, hypertension EXAM: MRI HEAD WITHOUT CONTRAST TECHNIQUE: Multiplanar, multiecho pulse sequences of the brain and surrounding structures were obtained without intravenous contrast. COMPARISON:  Head CT 10/08/1997 FINDINGS: Brain: There is no evidence of acute infarct. No evidence of intracranial mass. No midline shift or extra-axial fluid collection. No chronic intracranial blood products. Small region of cortical/subcortical encephalomalacia and gliosis within left parietal lobe, which may reflect a remote infarct. Scattered T2/FLAIR hyperintensity within the cerebral white matter is advanced for age, but likely reflects sequela of chronic small vessel ischemia given provided history of  hypertension. Cerebral volume is normal for age. Vascular: Flow voids maintained within the proximal large arterial vessels. Skull and upper cervical spine: No focal marrow lesion Sinuses/Orbits: Visualized orbits demonstrate no acute abnormality. Mild scattered paranasal sinus mucosal thickening. Trace fluid within bilateral mastoid air cells. IMPRESSION: 1. No evidence of acute intracranial abnormality. 2. Small region of encephalomalacia within the left parietal lobe, which may reflect a remote cortically based infarct. 3. Cerebral white matter disease is advanced for age, but likely reflect sequela of chronic small vessel ischemia given provided history of hypertension. Electronically Signed   By: Kellie Simmering DO   On: 12/16/2018 11:50    Cardiac Studies   Echo 12/14/18 1. Left ventricular ejection fraction, by visual estimation, is 30 to 35%. The left ventricle has severely decreased function. There is no left ventricular hypertrophy. 2. Left ventricular diastolic parameters are consistent with Grade II diastolic dysfunction (pseudonormalization). 3. Moderately dilated left ventricular internal cavity size. 4. The left ventricle demonstrates global hypokinesis. 5. Global right ventricle has normal systolic function.The right ventricular size is normal. No increase in right ventricular wall thickness. 6. Left atrial size was mildly dilated. 7. Right atrial size was not well visualized. 8. Trivial pericardial effusion is present. 9. The mitral valve is normal in structure. Trace mitral valve regurgitation. 10. The tricuspid valve is normal in structure. Tricuspid valve regurgitation is trivial. 11. The aortic valve is normal in structure. Aortic valve regurgitation is not visualized. No evidence of aortic valve sclerosis or stenosis. 12. The pulmonic valve was grossly normal. Pulmonic valve regurgitation is trivial. 13. Aortic dilatation noted. 14. There is mild dilatation of the  ascending aorta measuring 37 mm. 15. TR signal is inadequate for assessing pulmonary artery systolic pressure. 16. The inferior vena cava is normal in size with greater than 50% respiratory variability, suggesting right atrial pressure of 3 mmHg  Cardiac Cath 12/14/18 1. Patent coronary arteries with mild nonobstructive CAD 2. Moderately elevated LVEDP (38mmHg)  Recommend: medical therapy for HTN/CHF Coronary Diagrams  Diagnostic Dominance: Right      Patient Profile     58 y.o. 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  Assessment & Plan    Chest Pain/Hypertensive Urgency Patient presented with chest pain in the setting of severely elevated  BP, systolics up to [id number], but also reporting exertional symptoms. Improved with NTG. Patient had not taken medications for 1 month. Increased home med Coreg to 25 mg BID.Patient has a hx of dilated CM, EF 35-40% in 2015. Cath was performed to evaluate ischemic cause. - As outpatient patient was on amlodipine 5 mg daily, hydralazine 25 mg BID, quinapril 40 mg daily, Coreg 12.5 mg daily. Coreg restarted on admission. - Cath showed nonobstructive CAD - echo showed EF 30-35%, G2DD - Patient had twinge of chest pain last night, reproducible on exam.  - With complaints of blurry vision MRI brain ordered to evaluate for PRES syndrome >>showed small region of encephalomalacia within the left parietal lobe, which may reflect a remote cortically based infarct; likely chronic small vessel ischemia  -Entresto increased to 49-51mg  twice daily yesterday. Spiro 25 mg daily added. Lasix 20 mg BID added back.  - Pressures still elevated. Will Increase Entresto.   Nonobstructive CAD - per cath  - continue with medical management and lifestyle changes - continue BB and statin.   Cardiomyopathy/Chronic combined systolic  and diastolic HF - Hx of dilated CM EF 35-40% in 2015. Echo this admission showing EF 30-35% - At home patient was on lasix 20 mg daily, BB, quinapril 40 mg daily. Patient has not had meds in 1 month. - continue BB and Entresto - continue home  Lasix 20 mg BID. Spiro 25 mg daily started   For questions or updates, please contact [place] Please consult [email or web address] for contact info under        Signed, Cadence Ninfa Meeker, PA-C  12/17/2018, 7:42 AM    Personally seen and examined. Agree with above.  Still quite hypertensive.  Regular rate and rhythm, alert, lungs are clear  Lab work reviewed as above  Assessment and plan:  Nonobstructive CAD Hypertensive cardiomyopathy, acute systolic heart failure Hypertensive urgency/difficult to control hypertension  We will go ahead and increase the Entresto as noted above.  Continue with carvedilol 25 mg twice a day.  Interestingly, his heart rate is still mildly elevated when laying in bed.  New start spironolactone yesterday furosemide 20 mg a day as well.  Stated that he was slightly dizzy when he stood up.  He did not have any significant orthostatic changes on blood pressure i.e. blood pressure did not drop very low upon standing.  Yesterday MRI showed no evidence of PRES  I would be comfortable from a cardiology perspective letting him get on home and reach steady state on his medications.  We would see him back in close follow-up.  Candee Furbish, MD

## 2018-12-17 NOTE — Progress Notes (Signed)
Pt planned to discharge home but began feeling lightheaded again since MD notification this AM. Per pt he "just didn't feel right". Slight headache and fluttering in stomach. MD paged and no new orders received, but did say d/c order could be canceled if pt did not feel better after dinner. Will continue to monitor pt and cancel d/c if appropriate.

## 2018-12-18 ENCOUNTER — Other Ambulatory Visit: Payer: Self-pay | Admitting: Cardiology

## 2018-12-18 LAB — GLUCOSE, CAPILLARY: Glucose-Capillary: 119 mg/dL — ABNORMAL HIGH (ref 70–99)

## 2018-12-18 MED ORDER — NITROGLYCERIN 0.4 MG SL SUBL: 0.4000 mg | SUBLINGUAL_TABLET | SUBLINGUAL | 3 refills | Status: DC | PRN

## 2018-12-18 MED ORDER — ATORVASTATIN CALCIUM 80 MG PO TABS: 80.0000 mg | ORAL_TABLET | Freq: Every day | ORAL | 3 refills | Status: DC

## 2018-12-18 MED ORDER — ASPIRIN 81 MG PO TBEC: 81.0000 mg | DELAYED_RELEASE_TABLET | Freq: Every day | ORAL | 2 refills | Status: AC

## 2018-12-18 NOTE — Progress Notes (Signed)
RN offered to ambulate with patient this morning.  Per patient it is too early for him and would like to wait until after breakfast; will update day shift RN during bedside reporting.

## 2018-12-18 NOTE — Telephone Encounter (Signed)
Pt calling requesting a refill on these medications. Pt has an appt with Dr. Marlou Porch on 12/29/18. Would Dr. Marlou Porch like to refill these medications? Please address

## 2018-12-18 NOTE — Progress Notes (Signed)
Pt ambulated in hall with no distress or complaints voiced.

## 2018-12-18 NOTE — Telephone Encounter (Signed)
°*  STAT* If patient is at the pharmacy, call can be transferred to refill team.   1. Which medications need to be refilled? (please list name of each medication and dose if known)  atorvastatin (LIPITOR) 80 MG tablet  nitroGLYCERIN (NITROSTAT) 0.4 MG SL tablet    sacubitril-valsartan (ENTRESTO) 97-103 MG  spironolactone (ALDACTONE) 25 MG tablet 2. Which pharmacy/location (including street and city if local pharmacy) is medication to be sent to? [facility] [phone] - [place], [place] AT [address]  3. Do they need a 30 day or 90 day supply? [place]

## 2018-12-18 NOTE — Progress Notes (Signed)
PROGRESS NOTE    Frank Solomon  [id number] DOB: 04-25-1960 DOA: 12/14/2018 PCP: Loretha Brasil, FNP   Brief Narrative:  58 year old with history of HTN, TIA, nonischemic cardiomyopathy, reduced ejection fraction CHF 30-35% came to the ER with chest pain.  EKG showed inferolateral T wave changes.  Left heart cath on 11/16 showed mild nonobstructive CAD with unchanged EF 35%.  ------------------------------------------------------------------------------------------------------------------------------------------------------------   Subjective:  The patient was discharged yesterday.  But remained in the hospital overnight for close observation as he was not feeling well.  His Entresto was increased yesterday morning.  His medications were finalized by cardiology.  Subsequently he was not feeling well for patient stating "just not feeling right"... He was having feeling of lightheadedness, slight headache, fluttering in his stomach.  Due to increase Entresto, improved blood pressure patient likely not used to normalizing his vitals including heart rate and blood pressure.  It was decided the patient to stay overnight for close observation.  He was seen and examined this morning, remained hemodynamically stable. Stating he is feeling much better.  He will be discharged home today.  His current medications were reviewed with him.  His prescription has been sent to mail out order and his current pharmacy.   The plan of care and discharge were discussed with him in detail.  He expressed understanding agree with the plan.    Disposition-will be discharged home.       Assessment & Plan:   Active Problems:   NICM (nonischemic cardiomyopathy) (HCC)   Chest pain   HTN (hypertension), benign   History of TIA (transient ischemic attack)   Blurred vision   Atypical chest pain secondary to hypertensive urgency Nonobstructive coronary artery disease Remained stable, no changes  denies any chest pain or shortness of breath -Noncompliant with home medications.  Left heart cath on 11/16-none obstructive CAD -Aspirin and statin  Blurry Vision b/l= -Resolved  No other focal neuro deficits. Will order MRI Brain without contrast to rule out CVA and eval for PRES in the setting on uncontrolled BP.   Congestive heart failure with reduced ejection fraction, ejection fraction [id number], grade 2 diastolic dysfunction -Coreg increased to twice daily dosing.  Entresto dosage was increased yesterday by cardiology He is to continue current dose of Aladatone and Lasix. Spoke with Dr Gillian Shields, appreciate his input  History of TIA -On aspirin and statin -Stable  Hyperlipidemia -Stable -On high intensity statin Lipitor 80 mg.  LDL elevated at 146.  Prediabetes -Hemoglobin A1c 6.3.  Diabetic education.   DVT prophylaxis: Subcu heparin Code Status: Full code Family Communication:  None Disposition Plan: Maintain hosp stay to ensure his blurry vision has been addressed and evaluated in the meantime cardiac medications being adjusted.   Consultants:   Cardiology     Objective: Vitals:   12/17/18 1443 12/17/18 1534 12/17/18 2207 12/18/18 0415  BP: (!) 148/100 (!) 149/103 131/87 (!) 134/91  Pulse:   100 89  Resp:      Temp:   99.3 F (37.4 C) 99.3 F (37.4 C)  TempSrc:   Oral Oral  SpO2:   99% 98%  Weight:    102.5 kg  Height:        Intake/Output Summary (Last 24 hours) at 12/18/2018 0748 Last data filed at 12/18/2018 0700 Gross per 24 hour  Intake 1478 ml  Output 1800 ml  Net -322 ml   Filed Weights   12/16/18 0400 12/17/18 0526 12/18/18 0415  Weight: 103.1 kg 103.1 kg 102.5 kg  Examination:  BP (!) 134/91 (BP Location: Right Arm)   Pulse 89   Temp 99.3 F (37.4 C) (Oral)   Resp 17   Ht 6' (1.829 m)   Wt 102.5 kg   SpO2 98%   BMI 30.64 kg/m    Physical Exam  Constitution:  Alert, cooperative, no distress,  Psychiatric: Normal and stable mood  and affect, cognition intact,   HEENT: Normocephalic, PERRL, otherwise with in Normal limits  Chest:Chest symmetric Cardio vascular:  S1/S2, RRR, No murmure, No Rubs or Gallops  pulmonary: Clear to auscultation bilaterally, respirations unlabored, negative wheezes / crackles Abdomen: Soft, non-tender, non-distended, bowel sounds,no masses, no organomegaly Muscular skeletal: Limited exam - in bed, able to move all 4 extremities, Normal strength,  Neuro: CNII-XII intact. , normal motor and sensation, reflexes intact  Extremities: No pitting edema lower extremities, +2 pulses  Skin: Dry, warm to touch, negative for any Rashes, No open wounds Wounds: per nursing documentation       Data Reviewed:   CBC: Recent Labs  Lab 12/14/18 0214 12/14/18 1656  WBC 8.1 8.4  NEUTROABS 5.5  --   HGB 13.8 14.0  HCT 41.7 42.2  MCV 103.5* 100.7*  PLT 212 0000000   Basic Metabolic Panel: Recent Labs  Lab 12/14/18 0214 12/14/18 1656 12/16/18 0759 12/17/18 0832  NA 140  --  141 139  K 3.9  --  3.7 3.7  CL 112*  --  108 106  CO2 19*  --  23 23  GLUCOSE 113*  --  117* 190*  BUN 13  --  9 10  CREATININE 1.03 0.88 0.90 1.30*  CALCIUM 8.6*  --  8.8* 8.7*   GFR: Estimated Creatinine Clearance: 77.7 mL/min (A) (by C-G formula based on SCr of 1.3 mg/dL (H)). Liver Function Tests: Recent Labs  Lab 12/14/18 0214  AST 36  ALT 20  ALKPHOS 108  BILITOT 0.9  PROT 6.1*  ALBUMIN 3.2*   No results for input(s): LIPASE, AMYLASE in the last 168 hours. No results for input(s): AMMONIA in the last 168 hours. Coagulation Profile: Recent Labs  Lab 12/14/18 0757  INR 1.0   Cardiac Enzymes: No results for input(s): CKTOTAL, CKMB, CKMBINDEX, TROPONINI in the last 168 hours. BNP (last 3 results) No results for input(s): PROBNP in the last 8760 hours. HbA1C: No results for input(s): HGBA1C in the last 72 hours. CBG: Recent Labs  Lab 12/16/18 2154 12/17/18 0757 12/17/18 1238 12/17/18 1633  12/17/18 2204  GLUCAP 127* 134* 102* 102* 125*   Lipid Profile: No results for input(s): CHOL, HDL, LDLCALC, TRIG, CHOLHDL, LDLDIRECT in the last 72 hours. Thyroid Function Tests: No results for input(s): TSH, T4TOTAL, FREET4, T3FREE, THYROIDAB in the last 72 hours. Anemia Panel: No results for input(s): VITAMINB12, FOLATE, FERRITIN, TIBC, IRON, RETICCTPCT in the last 72 hours. Sepsis Labs: No results for input(s): PROCALCITON, LATICACIDVEN in the last 168 hours.  Recent Results (from the past 240 hour(s))  SARS Coronavirus 2 by RT PCR (hospital order, performed in [facility] hospital lab) Nasopharyngeal Nasopharyngeal Swab     Status: None   Collection Time: 12/14/18  7:59 AM   Specimen: Nasopharyngeal Swab  Result Value Ref Range Status   SARS Coronavirus 2 NEGATIVE NEGATIVE Final    Comment: (NOTE) If result is NEGATIVE SARS-CoV-2 target nucleic acids are NOT DETECTED. The SARS-CoV-2 RNA is generally detectable in upper and lower  respiratory specimens during the acute phase of infection. The lowest  concentration of SARS-CoV-2  viral copies this assay can detect is 250  copies / mL. A negative result does not preclude SARS-CoV-2 infection  and should not be used as the sole basis for treatment or other  patient management decisions.  A negative result may occur with  improper specimen collection / handling, submission of specimen other  than nasopharyngeal swab, presence of viral mutation(s) within the  areas targeted by this assay, and inadequate number of viral copies  (<250 copies / mL). A negative result must be combined with clinical  observations, patient history, and epidemiological information. If result is POSITIVE SARS-CoV-2 target nucleic acids are DETECTED. The SARS-CoV-2 RNA is generally detectable in upper and lower  respiratory specimens dur ing the acute phase of infection.  Positive  results are indicative of active infection with SARS-CoV-2.  Clinical   correlation with patient history and other diagnostic information is  necessary to determine patient infection status.  Positive results do  not rule out bacterial infection or co-infection with other viruses. If result is PRESUMPTIVE POSTIVE SARS-CoV-2 nucleic acids MAY BE PRESENT.   A presumptive positive result was obtained on the submitted specimen  and confirmed on repeat testing.  While 2019 novel coronavirus  (SARS-CoV-2) nucleic acids may be present in the submitted sample  additional confirmatory testing may be necessary for epidemiological  and / or clinical management purposes  to differentiate between  SARS-CoV-2 and other Sarbecovirus currently known to infect humans.  If clinically indicated additional testing with an alternate test  methodology [id number]) is advised. The SARS-CoV-2 RNA is generally  detectable in upper and lower respiratory sp ecimens during the acute  phase of infection. The expected result is Negative. Fact Sheet for Patients:  StrictlyIdeas.no Fact Sheet for Healthcare Providers: BankingDealers.co.za This test is not yet approved or cleared by the [place] FDA and has been authorized for detection and/or diagnosis of SARS-CoV-2 by FDA under an Emergency Use Authorization (EUA).  This EUA will remain in effect (meaning this test can be used) for the duration of the COVID-19 declaration under Section 564(b)(1) of the Act, 21 U.S.C. section 360bbb-3(b)(1), unless the authorization is terminated or revoked sooner. Performed at [facility], [place] [address], [place], [place] [address]          Radiology Studies: Mr Brain 62 Contrast  Result Date: 12/16/2018 CLINICAL DATA:  TIA, initial exam. Additional history provided: Rule out CVA, concern for PRES. Blurry vision starting this morning, hypertension EXAM: MRI HEAD WITHOUT CONTRAST TECHNIQUE: Multiplanar, multiecho pulse sequences of the  brain and surrounding structures were obtained without intravenous contrast. COMPARISON:  Head CT 10/08/1997 FINDINGS: Brain: There is no evidence of acute infarct. No evidence of intracranial mass. No midline shift or extra-axial fluid collection. No chronic intracranial blood products. Small region of cortical/subcortical encephalomalacia and gliosis within left parietal lobe, which may reflect a remote infarct. Scattered T2/FLAIR hyperintensity within the cerebral white matter is advanced for age, but likely reflects sequela of chronic small vessel ischemia given provided history of hypertension. Cerebral volume is normal for age. Vascular: Flow voids maintained within the proximal large arterial vessels. Skull and upper cervical spine: No focal marrow lesion Sinuses/Orbits: Visualized orbits demonstrate no acute abnormality. Mild scattered paranasal sinus mucosal thickening. Trace fluid within bilateral mastoid air cells. IMPRESSION: 1. No evidence of acute intracranial abnormality. 2. Small region of encephalomalacia within the left parietal lobe, which may reflect a remote cortically based infarct. 3. Cerebral white matter disease is advanced for age, but  likely reflect sequela of chronic small vessel ischemia given provided history of hypertension. Electronically Signed   By: Kellie Simmering DO   On: 12/16/2018 11:50        Scheduled Meds: . aspirin EC  81 mg Oral Daily  . atorvastatin  80 mg Oral q1800  . carvedilol  25 mg Oral BID  . furosemide  20 mg Oral Daily  . heparin  5,000 Units Subcutaneous Q8H  . insulin aspart  0-9 Units Subcutaneous TID WC  . pantoprazole  40 mg Oral Daily  . sacubitril-valsartan  1 tablet Oral BID  . sodium chloride flush  3 mL Intravenous Q12H  . sodium chloride flush  3 mL Intravenous Q12H  . spironolactone  25 mg Oral Daily   Continuous Infusions: . sodium chloride       LOS: 2 days   Time spent= 35 mins    Deatra James, MD [facility]   If 7PM-7AM, please contact night-coverage  12/18/2018, 7:48 AM

## 2018-12-29 ENCOUNTER — Ambulatory Visit (INDEPENDENT_AMBULATORY_CARE_PROVIDER_SITE_OTHER): Payer: Medicare Other | Admitting: Cardiology

## 2018-12-29 ENCOUNTER — Other Ambulatory Visit: Payer: Self-pay

## 2018-12-29 ENCOUNTER — Encounter: Payer: Self-pay | Admitting: Cardiology

## 2018-12-29 VITALS — BP 130/90 | HR 73 | Ht 72.0 in | Wt 235.0 lb

## 2018-12-29 DIAGNOSIS — E7800 Pure hypercholesterolemia, unspecified: Secondary | ICD-10-CM

## 2018-12-29 DIAGNOSIS — I5022 Chronic systolic (congestive) heart failure: Secondary | ICD-10-CM | POA: Diagnosis not present

## 2018-12-29 DIAGNOSIS — Z8673 Personal history of transient ischemic attack (TIA), and cerebral infarction without residual deficits: Secondary | ICD-10-CM

## 2018-12-29 DIAGNOSIS — I2510 Atherosclerotic heart disease of native coronary artery without angina pectoris: Secondary | ICD-10-CM | POA: Diagnosis not present

## 2018-12-29 NOTE — Patient Instructions (Signed)
Medication Instructions:  The current medical regimen is effective;  continue present plan and medications.  *If you need a refill on your cardiac medications before your next appointment, please call your pharmacy*  Follow-Up: At [facility], you and your health needs are our priority.  As part of our continuing mission to provide you with exceptional heart care, we have created designated Provider Care Teams.  These Care Teams include your primary Cardiologist (physician) and Advanced Practice Providers (APPs -  Physician Assistants and Nurse Practitioners) who all work together to provide you with the care you need, when you need it.  Your next appointment:   6 month(s)  The format for your next appointment:   In Person  Provider:   You may see Candee Furbish, MD or one of the following Advanced Practice Providers on your designated Care Team:    Truitt Merle, NP  Cecilie Kicks, NP  Kathyrn Drown, NP And 1 year with Dr Marlou Porch.  Thank you for choosing [facility]!!

## 2018-12-29 NOTE — Progress Notes (Signed)
Cardiology Office Note:    Date:  12/29/2018   ID:  Frank Solomon, DOB [birth year], MRN [id number]  PCP:  Loretha Brasil, FNP  Cardiologist:  Candee Furbish, MD  Electrophysiologist:  None   Referring MD: Loretha Brasil, FNP     History of Present Illness:    Frank Solomon is a 58 y.o. male here for follow-up nonischemic cardiomyopathy EF 30 to 35% with mild nonobstructive CAD on left heart cath 12/14/2018.  Had difficult to control hypertension, hypertensive urgency, blurry vision, MRI was unremarkable.  Medications were increased during hospital stay.    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 per pt, was ok   LEFT HEART CATH AND CORONARY ANGIOGRAPHY N/A 12/14/2018   Procedure: LEFT HEART CATH AND CORONARY ANGIOGRAPHY;  Surgeon: Sherren Mocha, MD;  Location: [place] CV LAB;  Service: Cardiovascular;  Laterality: N/A;   VIDEO ASSISTED THORACOSCOPY (VATS)/DECORTICATION Right 09/07/2013   Procedure: VIDEO ASSISTED THORACOSCOPY (VATS)/ DRAINAGE OF EMPYEMA/ DECORTICATION;  Surgeon: Melrose Nakayama, MD;  Location: [facility];  Service: Thoracic;  Laterality: Right;    Current Medications: Current Meds  Medication Sig   aspirin 81 MG EC tablet Take 1 tablet (81 mg total) by mouth daily.   atorvastatin (LIPITOR) 80 MG tablet Take 1 tablet (80 mg total) by mouth daily at 6 PM.   carvedilol (COREG) 25 MG tablet Take 1 tablet (25 mg total) by mouth 2 (two) times daily.   furosemide (LASIX) 20 MG tablet Take 1 tablet (20 mg total) by mouth daily.   nitroGLYCERIN (NITROSTAT) 0.4 MG SL tablet Place 1 tablet (0.4 mg total) under the tongue every 5 (five) minutes as needed for up to 3 days for chest pain (CP or SOB).   pantoprazole  (PROTONIX) 40 MG tablet Take 1 tablet (40 mg total) by mouth daily.   sacubitril-valsartan (ENTRESTO) 97-103 MG Take 1 tablet by mouth 2 (two) times daily.   spironolactone (ALDACTONE) 25 MG tablet Take 1 tablet (25 mg total) by mouth daily.     Allergies:   Patient has no known allergies.   Social History   Socioeconomic History   Marital status: Divorced    Spouse name: Not on file   Number of children: Not on file   Years of education: Not on file   Highest education level: Not on file  Occupational History   Occupation: [employer], part-time  Scientist, product/process development strain: Not on file   Food insecurity    Worry: Not on file    Inability: Not on file   Transportation needs    Medical: Not on file    Non-medical: Not on file  Tobacco Use   Smoking status: Current Some Day Smoker    Packs/day: 0.10    Types: Cigarettes   Smokeless tobacco: Never Used  Substance and Sexual Activity   Alcohol use: No   Drug use: No   Sexual activity: Not on file  Lifestyle   Physical activity    Days per week: Not on file    Minutes per session: Not on file   Stress: Not on file  Relationships   Social connections    Talks on phone: Not  on file    Gets together: Not on file    Attends religious service: Not on file    Active member of club or organization: Not on file    Attends meetings of clubs or organizations: Not on file    Relationship status: Not on file  Other Topics Concern   Not on file  Social History Narrative   Not on file     Family History: The patient's family history includes Dementia in his mother; Diabetes in his brother.  ROS:   Please see the history of present illness.    No fevers chills nausea vomiting all other systems reviewed and are negative.  EKGs/Labs/Other Studies Reviewed:    The following studies were reviewed today:  Echocardiogram 12/14/2018-EF 30 to 35%  Left heart cath 12/14/2018-nonobstructive CAD  EKG:   EKG is not ordered today.  12/14/2018-sinus rhythm T wave inversions noted in lateral leads  Recent Labs: 12/14/2018: ALT 20; B Natriuretic Peptide 477.3; Hemoglobin 14.0; Platelets 188; TSH 0.694 12/17/2018: BUN 10; Creatinine, Ser 1.30; Potassium 3.7; Sodium 139  Recent Lipid Panel    Component Value Date/Time   CHOL 205 (H) 12/14/2018 0822   TRIG 65 12/14/2018 0822   HDL 46 12/14/2018 0822   CHOLHDL 4.5 12/14/2018 0822   VLDL 13 12/14/2018 0822   LDLCALC 146 (H) 12/14/2018 0822    Physical Exam:    VS:  BP 130/90    Pulse 73    Ht 6' (1.829 m)    Wt 235 lb (106.6 kg)    SpO2 97%    BMI 31.87 kg/m     Wt Readings from Last 3 Encounters:  12/29/18 235 lb (106.6 kg)  12/18/18 225 lb 14.4 oz (102.5 kg)  08/02/14 225 lb (102.1 kg)     GEN:  Well nourished, well developed in no acute distress HEENT: Normal NECK: No JVD; No carotid bruits LYMPHATICS: No lymphadenopathy CARDIAC: RRR, no murmurs, rubs, gallops RESPIRATORY:  Clear to auscultation without rales, wheezing or rhonchi  ABDOMEN: Soft, non-tender, non-distended MUSCULOSKELETAL:  No edema; No deformity  SKIN: Warm and dry NEUROLOGIC:  Alert and oriented x 3 PSYCHIATRIC:  Normal affect   ASSESSMENT:    1. Chronic systolic heart failure ([place])   2. Coronary artery disease involving native coronary artery of native heart without angina pectoris   3. History of TIA (transient ischemic attack)   4. Pure hypercholesterolemia    PLAN:    In order of problems listed above:  Chronic systolic heart failure/nonischemic cardiomyopathy -Likely hypertensive related continue to encourage compliance with medications.  Seems to doing well with this.  He did get his medications straightened out with optimum Rx.  Meds reviewed as above.  Recent lab work also shows reassuring creatinine and potassium.  No evidence of fluid overload.  NYHA class I-II.  Minimal shortness of breath with activity.  Nonobstructive CAD -Continue with  aspirin statin  Prior blurry vision -Continue to adequately treat blood pressure.  MRI was unremarkable.  Prior history of TIA -On aspirin statin secondary prevention blood pressure control of utmost importance.  Hyperlipidemia -High intensity statin 80 mg of Lipitor.  LDL previously elevated at 146   Medication Adjustments/Labs and Tests Ordered: Current medicines are reviewed at length with the patient today.  Concerns regarding medicines are outlined above.  No orders of the defined types were placed in this encounter.  No orders of the defined types were placed in this encounter.   Patient Instructions  Medication  Instructions:  The current medical regimen is effective;  continue present plan and medications.  *If you need a refill on your cardiac medications before your next appointment, please call your pharmacy*  Follow-Up: At [facility], you and your health needs are our priority.  As part of our continuing mission to provide you with exceptional heart care, we have created designated Provider Care Teams.  These Care Teams include your primary Cardiologist (physician) and Advanced Practice Providers (APPs -  Physician Assistants and Nurse Practitioners) who all work together to provide you with the care you need, when you need it.  Your next appointment:   6 month(s)  The format for your next appointment:   In Person  Provider:   You may see Candee Furbish, MD or one of the following Advanced Practice Providers on your designated Care Team:    Truitt Merle, NP  Cecilie Kicks, NP  Kathyrn Drown, NP And 1 year with Dr Marlou Porch.  Thank you for choosing [facility]!!         Signed, Candee Furbish, MD  12/29/2018 12:04 PM    [place]

## 2019-01-11 ENCOUNTER — Emergency Department (HOSPITAL_COMMUNITY): Payer: Medicare Other

## 2019-01-11 ENCOUNTER — Other Ambulatory Visit: Payer: Self-pay

## 2019-01-11 ENCOUNTER — Encounter (HOSPITAL_COMMUNITY): Payer: Self-pay

## 2019-01-11 ENCOUNTER — Other Ambulatory Visit (HOSPITAL_COMMUNITY): Payer: Medicare Other

## 2019-01-11 ENCOUNTER — Emergency Department (HOSPITAL_COMMUNITY)
Admission: EM | Admit: 2019-01-11 | Discharge: 2019-01-11 | Disposition: A | Discharge status: Home / Self Care | Payer: Medicare Other | Attending: Emergency Medicine | Admitting: Emergency Medicine

## 2019-01-11 DIAGNOSIS — Z79899 Other long term (current) drug therapy: Secondary | ICD-10-CM | POA: Diagnosis not present

## 2019-01-11 DIAGNOSIS — N50812 Left testicular pain: Secondary | ICD-10-CM | POA: Diagnosis present

## 2019-01-11 DIAGNOSIS — F17210 Nicotine dependence, cigarettes, uncomplicated: Secondary | ICD-10-CM | POA: Insufficient documentation

## 2019-01-11 DIAGNOSIS — I110 Hypertensive heart disease with heart failure: Secondary | ICD-10-CM | POA: Diagnosis not present

## 2019-01-11 DIAGNOSIS — N453 Epididymo-orchitis: Secondary | ICD-10-CM | POA: Insufficient documentation

## 2019-01-11 DIAGNOSIS — Z7982 Long term (current) use of aspirin: Secondary | ICD-10-CM | POA: Insufficient documentation

## 2019-01-11 DIAGNOSIS — I509 Heart failure, unspecified: Secondary | ICD-10-CM | POA: Insufficient documentation

## 2019-01-11 LAB — URINALYSIS, ROUTINE W REFLEX MICROSCOPIC
Bilirubin Urine: NEGATIVE
Glucose, UA: NEGATIVE mg/dL
Ketones, ur: NEGATIVE mg/dL
Nitrite: NEGATIVE
Protein, ur: 30 mg/dL — AB
Specific Gravity, Urine: 1.017 (ref 1.005–1.030)
pH: 6 (ref 5.0–8.0)

## 2019-01-11 LAB — BASIC METABOLIC PANEL
Anion gap: 11 (ref 5–15)
BUN: 7 mg/dL (ref 6–20)
CO2: 21 mmol/L — ABNORMAL LOW (ref 22–32)
Calcium: 8.7 mg/dL — ABNORMAL LOW (ref 8.9–10.3)
Chloride: 106 mmol/L (ref 98–111)
Creatinine, Ser: 0.96 mg/dL (ref 0.61–1.24)
GFR calc Af Amer: >60 mL/min (ref >60)
GFR calc non Af Amer: >60 mL/min (ref >60)
Glucose, Bld: 129 mg/dL — ABNORMAL HIGH (ref 70–99)
Potassium: 3.7 mmol/L (ref 3.5–5.1)
Sodium: 138 mmol/L (ref 135–145)

## 2019-01-11 LAB — CBC
HCT: 40.6 % (ref 39.0–52.0)
Hemoglobin: 13.3 g/dL (ref 13.0–17.0)
MCH: 33.3 pg (ref 26.0–34.0)
MCHC: 32.8 g/dL (ref 30.0–36.0)
MCV: 101.8 fL — ABNORMAL HIGH (ref 80.0–100.0)
Platelets: 320 10*3/uL (ref 150–400)
RBC: 3.99 MIL/uL — ABNORMAL LOW (ref 4.22–5.81)
RDW: 13.8 % (ref 11.5–15.5)
WBC: 16.7 10*3/uL — ABNORMAL HIGH (ref 4.0–10.5)
nRBC: 0 % (ref 0.0–0.2)

## 2019-01-11 LAB — HIV ANTIBODY (ROUTINE TESTING W REFLEX): HIV Screen 4th Generation wRfx: NONREACTIVE

## 2019-01-11 LAB — RPR: RPR Ser Ql: NONREACTIVE

## 2019-01-11 MED ORDER — DOXYCYCLINE HYCLATE 100 MG PO TABS
100.0000 mg | ORAL_TABLET | Freq: Once | ORAL | Status: AC
  Administered 2019-01-11: 100 mg via ORAL
  Filled 2019-01-11: qty 1

## 2019-01-11 MED ORDER — KETOROLAC TROMETHAMINE 30 MG/ML IJ SOLN
30.0000 mg | Freq: Once | INTRAMUSCULAR | Status: AC
  Administered 2019-01-11: 30 mg via INTRAVENOUS
  Filled 2019-01-11: qty 1

## 2019-01-11 MED ORDER — LEVOFLOXACIN 500 MG PO TABS: 500.0000 mg | ORAL_TABLET | Freq: Every day | ORAL | 0 refills | Status: DC

## 2019-01-11 MED ORDER — IBUPROFEN 600 MG PO TABS: 600.0000 mg | ORAL_TABLET | Freq: Four times a day (QID) | ORAL | 0 refills | Status: AC | PRN

## 2019-01-11 MED ORDER — HYDROMORPHONE HCL 1 MG/ML IJ SOLN
1.0000 mg | Freq: Once | INTRAMUSCULAR | Status: AC
  Administered 2019-01-11: 1 mg via INTRAVENOUS
  Filled 2019-01-11: qty 1

## 2019-01-11 MED ORDER — LEVOFLOXACIN 500 MG PO TABS
500.0000 mg | ORAL_TABLET | Freq: Once | ORAL | Status: AC
  Administered 2019-01-11: 500 mg via ORAL
  Filled 2019-01-11: qty 1

## 2019-01-11 MED ORDER — HYDROCODONE-ACETAMINOPHEN 5-325 MG PO TABS: 1.0000 | ORAL_TABLET | Freq: Four times a day (QID) | ORAL | 0 refills | Status: DC | PRN

## 2019-01-11 MED ORDER — STERILE WATER FOR INJECTION IJ SOLN
INTRAMUSCULAR | Status: AC
  Filled 2019-01-11: qty 10

## 2019-01-11 MED ORDER — DOXYCYCLINE HYCLATE 100 MG PO CAPS: 100.0000 mg | ORAL_CAPSULE | Freq: Two times a day (BID) | ORAL | 0 refills | Status: DC

## 2019-01-11 MED ORDER — CEFTRIAXONE SODIUM 250 MG IJ SOLR
250.0000 mg | Freq: Once | INTRAMUSCULAR | Status: AC
  Administered 2019-01-11: 250 mg via INTRAMUSCULAR
  Filled 2019-01-11: qty 250

## 2019-01-11 NOTE — ED Notes (Signed)
Patient verbalizes understanding of discharge instructions. Opportunity for questioning and answers were provided. Armband removed by staff, pt discharged from ED.  

## 2019-01-11 NOTE — Discharge Instructions (Signed)
1.  You may take ibuprofen every 8 hours to control pain and swelling.  If you need additional pain control, you may take 1-2 Vicodin tablets every 6 hours as well.  Try to elevate your scrotum with a towel roll and you may also apply cool ice packs.  Take care to make sure that the ice packs are well wrapped and you do not injure your skin by over cooling. 2.  You were given a dose of Rocephin, levaquin and doxycycline in the emergency department.  Continue doxycycline twice daily as prescribed, starting with your first dose this evening.  Take Levaquin once daily, starting with your first dose tomorrow. 3.  Return to the emergency department if you are having worsening symptoms such as fever, vomiting, pain not controlled or other concerning symptoms. 4.  There will be additional lab results that are not available today.  You should have your doctor follow-up on additional results and check in your "my chart".

## 2019-01-11 NOTE — ED Notes (Signed)
Patient transported to Ultrasound 

## 2019-01-11 NOTE — ED Provider Notes (Signed)
[facility] EMERGENCY DEPARTMENT Provider Note   CSN: [id number] Arrival date & time: 01/11/19  [id number]     History No chief complaint on file.   Frank Solomon is a 58 y.o. male.  HPI Testicle pain started 2 days ago after work.  Patient denies any heavy lifting or straining.  Aching pain proceeded swelling.  Over the weekend left testicle became very swollen, he reports about 3 times the other testicle.  It hurts to walk or move it.  Pain radiates up to the lower left abdomen and partly down the left leg.  No swelling numbness or tingling of the leg.  No pain burning urgency with urination.  He reports he is in a monogamous relationship.  Last sexual activity 2 weeks ago.  No penile drainage or discharge.  No history of similar episode.  He tried Tylenol and icing for pain relief.  Reports it has worsened and he is no longer getting any pain relief from those measures.    Past Medical History:  Diagnosis Date  . Cardiomyopathy, dilated ([place])    Diagnosed 1999 had biopsy and also VT  . CHF (congestive heart failure) ([place])   . TIA (transient ischemic attack)     Patient Active Problem List   Diagnosis Date Noted  . Blurred vision 12/16/2018  . Chest pain 12/14/2018  . HTN (hypertension), benign 12/14/2018  . History of TIA (transient ischemic attack) 12/14/2018  . Acute respiratory failure ([place]) 09/07/2013  . Lung abscess ([place]) 09/07/2013  . Empyema ([place]) 09/07/2013  . Sepsis ([place]) 09/07/2013  . NICM (nonischemic cardiomyopathy) ([place])   . Hypertensive heart disease     Past Surgical History:  Procedure Laterality Date  . CARDIAC CATHETERIZATION  1999   Dr Wynonia Lawman. per pt, was ok  . LEFT HEART CATH AND CORONARY ANGIOGRAPHY N/A 12/14/2018   Procedure: LEFT HEART CATH AND CORONARY ANGIOGRAPHY;  Surgeon: Sherren Mocha, MD;  Location: [place] CV LAB;  Service: Cardiovascular;  Laterality: N/A;  . VIDEO ASSISTED THORACOSCOPY (VATS)/DECORTICATION Right  09/07/2013   Procedure: VIDEO ASSISTED THORACOSCOPY (VATS)/ DRAINAGE OF EMPYEMA/ DECORTICATION;  Surgeon: Melrose Nakayama, MD;  Location: [place];  Service: Thoracic;  Laterality: Right;       Family History  Problem Relation Age of Onset  . Dementia Mother   . Diabetes Brother     Social History   Tobacco Use  . Smoking status: Current Some Day Smoker    Packs/day: 0.10    Types: Cigarettes  . Smokeless tobacco: Never Used  Substance Use Topics  . Alcohol use: No  . Drug use: No    Home Medications Prior to Admission medications   Medication Sig Start Date End Date Taking? Authorizing Provider  aspirin 81 MG EC tablet Take 1 tablet (81 mg total) by mouth daily. 12/18/18 01/17/19  Deatra James, MD  atorvastatin (LIPITOR) 80 MG tablet Take 1 tablet (80 mg total) by mouth daily at 6 PM. 12/18/18 01/17/19  Shahmehdi, Valeria Batman, MD  carvedilol (COREG) 25 MG tablet Take 1 tablet (25 mg total) by mouth 2 (two) times daily. 12/17/18 01/16/19  ShahmehdiValeria Batman, MD  doxycycline (VIBRAMYCIN) 100 MG capsule Take 1 capsule (100 mg total) by mouth 2 (two) times daily. 01/11/19   Charlesetta Shanks, MD  furosemide (LASIX) 20 MG tablet Take 1 tablet (20 mg total) by mouth daily. 12/17/18 01/16/19  ShahmehdiValeria Batman, MD  HYDROcodone-acetaminophen (NORCO/VICODIN) 5-325 MG tablet Take 1-2 tablets by mouth every  6 (six) hours as needed for moderate pain or severe pain. 01/11/19   Charlesetta Shanks, MD  ibuprofen (ADVIL) 600 MG tablet Take 1 tablet (600 mg total) by mouth every 6 (six) hours as needed. 01/11/19   Charlesetta Shanks, MD  nitroGLYCERIN (NITROSTAT) 0.4 MG SL tablet Place 1 tablet (0.4 mg total) under the tongue every 5 (five) minutes as needed for up to 3 days for chest pain (CP or SOB). 12/18/18 12/29/18  Shahmehdi, Valeria Batman, MD  pantoprazole (PROTONIX) 40 MG tablet Take 1 tablet (40 mg total) by mouth daily. 12/18/18 01/17/19  Shahmehdi, Valeria Batman, MD  sacubitril-valsartan (ENTRESTO)  97-103 MG Take 1 tablet by mouth 2 (two) times daily. 12/17/18 01/16/19  Deatra James, MD  spironolactone (ALDACTONE) 25 MG tablet Take 1 tablet (25 mg total) by mouth daily. 12/18/18 01/17/19  Deatra James, MD    Allergies    Patient has no known allergies.  Review of Systems   Review of Systems 10 Systems reviewed and are negative for acute change except as noted in the HPI.  Physical Exam Updated Vital Signs BP (!) 142/90 (BP Location: Right Arm)   Pulse 78   Temp 98.3 F (36.8 C) (Oral)   Resp 17   Ht 6' (1.829 m)   Wt 100.7 kg   SpO2 100%   BMI 30.11 kg/m   Physical Exam Constitutional:      Comments: Alert nontoxic.  Clinically well in appearance.  Well-nourished well-developed.  HENT:     Head: Normocephalic and atraumatic.  Eyes:     Extraocular Movements: Extraocular movements intact.  Cardiovascular:     Rate and Rhythm: Normal rate and regular rhythm.  Pulmonary:     Effort: Pulmonary effort is normal.     Breath sounds: Normal breath sounds.  Abdominal:     Comments: Abdomen is soft.  Moderate left lower quadrant tenderness to palpation.  No guarding.  No appreciable mass.  Inguinal regions without mass fullness or lymphadenopathy.  Genitourinary:    Comments: Penis normal appearance.  No lesions.  Left testicle enlarged approximately 10 cm very tender.  Fullness and tenderness in region of epididymis.  Right testicle smooth contours nontender noninflamed.  No appearance of erythema of the scrotal tissues. Musculoskeletal:        General: No swelling or tenderness. Normal range of motion.     Left lower leg: No edema.  Skin:    General: Skin is warm and dry.  Neurological:     General: No focal deficit present.     Mental Status: He is oriented to person, place, and time.     Coordination: Coordination normal.  Psychiatric:        Mood and Affect: Mood normal.     ED Results / Procedures / Treatments   Labs (all labs ordered are listed,  but only abnormal results are displayed) Labs Reviewed  BASIC METABOLIC PANEL - Abnormal; Notable for the following components:      Result Value   CO2 21 (*)    Glucose, Bld 129 (*)    Calcium 8.7 (*)    All other components within normal limits  CBC - Abnormal; Notable for the following components:   WBC 16.7 (*)    RBC 3.99 (*)    MCV 101.8 (*)    All other components within normal limits  URINALYSIS, ROUTINE W REFLEX MICROSCOPIC - Abnormal; Notable for the following components:   Hgb urine dipstick MODERATE (*)  Protein, ur 30 (*)    Leukocytes,Ua LARGE (*)    Bacteria, UA RARE (*)    All other components within normal limits  URINE CULTURE  RPR  HIV ANTIBODY (ROUTINE TESTING W REFLEX)  GC/CHLAMYDIA PROBE AMP ([place]) NOT AT [facility]    EKG None  Radiology US SCROTUM W/DOPPLER  Result Date: 01/11/2019 CLINICAL DATA:  58 year old male with left testicular swelling for 3 days. EXAM: SCROTAL ULTRASOUND DOPPLER ULTRASOUND OF THE TESTICLES TECHNIQUE: Complete ultrasound examination of the testicles, epididymis, and other scrotal structures was performed. Color and spectral Doppler ultrasound were also utilized to evaluate blood flow to the testicles. COMPARISON:  None. FINDINGS: Right testicle Measurements: 4.7 x 2.7 x 3.1 centimeters. No mass or microlithiasis visualized. Left testicle Measurements: 3.6 x 2.8 x 3.0 centimeters. Mildly heterogeneous echotexture (image 39) with no discrete mass and evidence of asymmetric hypervascularity on color Doppler (image 40). Right epididymis:  Normal in size and appearance. Left epididymis:  Enlarged and hypervascular (image 57). Hydrocele: Bilateral hydroceles, small to moderate. The left hydrocele appears simple. There is floating echogenic debris in the right hydrocele (image 27). Varicocele: Borderline varicocele on the left (image 70) although might be artifacts secondary to the left scrotal hypervascularity (image 74). Pulsed Doppler  interrogation of both testes demonstrates normal low resistance arterial and venous waveforms bilaterally. IMPRESSION: 1. Positive for evidence of left side Acute Orchitis And Epididymitis. 2. Negative for testicular torsion or mass. 3. Positive for small to moderate bilateral hydroceles, probably postinflammatory. 4. Questionable left side varicocele, favor artifact secondary to #1. Electronically Signed   By: Genevie Ann M.D.   On: 01/11/2019 09:03    Procedures Procedures (including critical care time)  Medications Ordered in ED Medications  sterile water (preservative free) injection (has no administration in time range)  HYDROmorphone (DILAUDID) injection 1 mg (1 mg Intravenous Given 01/11/19 0744)  ketorolac (TORADOL) 30 MG/ML injection 30 mg (30 mg Intravenous Given 01/11/19 0744)  cefTRIAXone (ROCEPHIN) injection 250 mg (250 mg Intramuscular Given 01/11/19 1015)  levofloxacin (LEVAQUIN) tablet 500 mg (500 mg Oral Given 01/11/19 1013)  doxycycline (VIBRA-TABS) tablet 100 mg (100 mg Oral Given 01/11/19 1013)    ED Course  I have reviewed the triage vital signs and the nursing notes.  Pertinent labs & imaging results that were available during my care of the patient were reviewed by me and considered in my medical decision making (see chart for details).    MDM Rules/Calculators/A&P     CHA2DS2/VAS Stroke Risk Points      N/A >= 2 Points: High Risk  1 - 1.99 Points: Medium Risk  0 Points: Low Risk    A final score could not be computed because of missing components.: Last  Change: N/A     This score determines the patient's risk of having a stroke if the  patient has atrial fibrillation.      This score is not applicable to this patient. Components are not  calculated.                   Patient is nontoxic and clinically well in appearance.  He has left, unilateral testicular swelling.  There is no swelling or skin changes involving the perineum or soft tissues of the groin or  suprapubic area.  Ultrasound confirms orchitis and epididymitis.  Patient is sexually active.  Will cover with a dose of Rocephin IM in the emergency department and have the patient continue doxycycline (MAR recommendation against combination  of a Zithromax and Levaquin, therefore will opt for doxycycline) and Levaquin (recommended for enteric coverage in older patients).  Urine cultures and GC chlamydia are pending.  Patient is aware of testing and pending results.  He is instructed to follow-up with his PCP.  If course is uncomplicated can continue care with PCP, if any concerns subsequent referral to urology may be needed.  Return precautions reviewed. Final Clinical Impression(s) / ED Diagnoses Final diagnoses:  Orchitis and epididymitis    Rx / DC Orders ED Discharge Orders         Ordered    doxycycline (VIBRAMYCIN) 100 MG capsule  2 times daily     01/11/19 1027    HYDROcodone-acetaminophen (NORCO/VICODIN) 5-325 MG tablet  Every 6 hours PRN     01/11/19 1027    ibuprofen (ADVIL) 600 MG tablet  Every 6 hours PRN     01/11/19 1027           Charlesetta Shanks, MD 01/11/19 1035

## 2019-01-11 NOTE — ED Triage Notes (Signed)
Patient states his testicle started to swell Friday and he did not see any improvement when taking tylenol and applying ice. Left testicle is swollen. Weakness down his leg on the same side. Pain 10/10

## 2019-01-13 LAB — URINE CULTURE: Culture: >=100000 — AB

## 2019-01-14 ENCOUNTER — Telehealth: Payer: Self-pay

## 2019-01-14 NOTE — Telephone Encounter (Signed)
Post ED Visit - Positive Culture Follow-up  Culture report reviewed by antimicrobial stewardship pharmacist: [place] Team []  Elenor Quinones, Pharm.D. []  Heide Guile, Pharm.D., BCPS AQ-ID []  Parks Neptune, Pharm.D., BCPS []  Alycia Rossetti, Pharm.D., BCPS []  Verdi, Pharm.D., BCPS, AAHIVP []  Legrand Como, Pharm.D., BCPS, AAHIVP []  Salome Arnt, PharmD, BCPS []  Johnnette Gourd, PharmD, BCPS []  Hughes Better, PharmD, BCPS []  Leeroy Cha, PharmD []  Laqueta Linden, PharmD, BCPS []  Albertina Parr, PharmD [place] Team []  Leodis Sias, PharmD []  Lindell Spar, PharmD []  Royetta Asal, PharmD []  Graylin Shiver, Rph []  Rema Fendt) Glennon Mac, PharmD []  Arlyn Dunning, PharmD []  Netta Cedars, PharmD []  Dia Sitter, PharmD []  Leone Haven, PharmD []  Gretta Arab, PharmD []  Theodis Shove, PharmD []  Peggyann Juba, PharmD []  Reuel Boom, PharmD   Positive urine culture Treated with Levofloxacin, organism sensitive to the same and no further patient follow-up is required at this time.  Genia Del 01/14/2019, 9:48 AM

## 2019-06-25 ENCOUNTER — Ambulatory Visit (INDEPENDENT_AMBULATORY_CARE_PROVIDER_SITE_OTHER): Payer: Medicare Other | Admitting: Cardiology

## 2019-06-25 ENCOUNTER — Encounter: Payer: Self-pay | Admitting: Cardiology

## 2019-06-25 ENCOUNTER — Other Ambulatory Visit: Payer: Self-pay

## 2019-06-25 VITALS — BP 120/80 | HR 67 | Ht 72.0 in | Wt 239.0 lb

## 2019-06-25 DIAGNOSIS — I2510 Atherosclerotic heart disease of native coronary artery without angina pectoris: Secondary | ICD-10-CM

## 2019-06-25 DIAGNOSIS — E7800 Pure hypercholesterolemia, unspecified: Secondary | ICD-10-CM

## 2019-06-25 DIAGNOSIS — Z8673 Personal history of transient ischemic attack (TIA), and cerebral infarction without residual deficits: Secondary | ICD-10-CM | POA: Diagnosis not present

## 2019-06-25 DIAGNOSIS — I5022 Chronic systolic (congestive) heart failure: Secondary | ICD-10-CM | POA: Diagnosis not present

## 2019-06-25 LAB — COMPREHENSIVE METABOLIC PANEL
ALT: 16 IU/L (ref 0–44)
AST: 33 IU/L (ref 0–40)
Albumin/Globulin Ratio: 1.7 (ref 1.2–2.2)
Albumin: 4.3 g/dL (ref 3.8–4.9)
Alkaline Phosphatase: 179 IU/L — ABNORMAL HIGH (ref 48–121)
BUN/Creatinine Ratio: 10 (ref 9–20)
BUN: 11 mg/dL (ref 6–24)
Bilirubin Total: 1 mg/dL (ref 0.0–1.2)
CO2: 21 mmol/L (ref 20–29)
Calcium: 9.3 mg/dL (ref 8.7–10.2)
Chloride: 104 mmol/L (ref 96–106)
Creatinine, Ser: 1.09 mg/dL (ref 0.76–1.27)
GFR calc Af Amer: 86 mL/min/{1.73_m2} (ref >59)
GFR calc non Af Amer: 74 mL/min/{1.73_m2} (ref >59)
Globulin, Total: 2.6 g/dL (ref 1.5–4.5)
Glucose: 110 mg/dL — ABNORMAL HIGH (ref 65–99)
Potassium: 5.1 mmol/L (ref 3.5–5.2)
Sodium: 139 mmol/L (ref 134–144)
Total Protein: 6.9 g/dL (ref 6.0–8.5)

## 2019-06-25 LAB — CBC WITH DIFFERENTIAL/PLATELET
Basophils Absolute: 0 10*3/uL (ref 0.0–0.2)
Basos: 0 %
EOS (ABSOLUTE): 0 10*3/uL (ref 0.0–0.4)
Eos: 1 %
Hematocrit: 44.5 % (ref 37.5–51.0)
Hemoglobin: 15.3 g/dL (ref 13.0–17.7)
Immature Grans (Abs): 0 10*3/uL (ref 0.0–0.1)
Immature Granulocytes: 0 %
Lymphocytes Absolute: 3.5 10*3/uL — ABNORMAL HIGH (ref 0.7–3.1)
Lymphs: 46 %
MCH: 33 pg (ref 26.6–33.0)
MCHC: 34.4 g/dL (ref 31.5–35.7)
MCV: 96 fL (ref 79–97)
Monocytes Absolute: 0.6 10*3/uL (ref 0.1–0.9)
Monocytes: 8 %
Neutrophils Absolute: 3.4 10*3/uL (ref 1.4–7.0)
Neutrophils: 45 %
Platelets: 249 10*3/uL (ref 150–450)
RBC: 4.63 x10E6/uL (ref 4.14–5.80)
RDW: 13.7 % (ref 11.6–15.4)
WBC: 7.7 10*3/uL (ref 3.4–10.8)

## 2019-06-25 LAB — LIPID PANEL
Chol/HDL Ratio: 2.7 ratio (ref 0.0–5.0)
Cholesterol, Total: 120 mg/dL (ref 100–199)
HDL: 45 mg/dL (ref >39)
LDL Chol Calc (NIH): 61 mg/dL (ref 0–99)
Triglycerides: 68 mg/dL (ref 0–149)
VLDL Cholesterol Cal: 14 mg/dL (ref 5–40)

## 2019-06-25 NOTE — Progress Notes (Signed)
Cardiology Office Note:    Date:  06/25/2019   ID:  Brave Forgey, DOB 03/08/1960, MRN [id number]  PCP:  Loretha Brasil, FNP  Cardiologist:  Candee Furbish, MD  Electrophysiologist:  None   Referring MD: Loretha Brasil, FNP     History of Present Illness:    Frank Solomon is a 59 y.o. male here for the follow-up of nonischemic cardiomyopathy with EF 35%, mild nonobstructive CAD on left heart catheterization in 2020.  Previously had hypertensive urgency with blurry vision with MRI that was unremarkable.  Difficult to control hypertension.  Likely hypertensive cardiomyopathy.  Cardiomyopathy was originally diagnosed in 1999 had biopsy at that time and also VT.  Past Medical History:  Diagnosis Date  . 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 CHF (congestive heart failure) ([place])   . TIA (transient ischemic attack)     Past Surgical History:  Procedure Laterality Date  . CARDIAC CATHETERIZATION  1999   Dr Wynonia Lawman. per pt, was ok  . LEFT HEART CATH AND CORONARY ANGIOGRAPHY N/A 12/14/2018   Procedure: LEFT HEART CATH AND CORONARY ANGIOGRAPHY;  Surgeon: Sherren Mocha, MD;  Location: [facility];  Service: Cardiovascular;  Laterality: N/A;  . VIDEO ASSISTED THORACOSCOPY (VATS)/DECORTICATION Right 09/07/2013   Procedure: VIDEO ASSISTED THORACOSCOPY (VATS)/ DRAINAGE OF EMPYEMA/ DECORTICATION;  Surgeon: Melrose Nakayama, MD;  Location: [facility];  Service: Thoracic;  Laterality: Right;    Current Medications: Current Meds  Medication Sig  . atorvastatin (LIPITOR) 80 MG tablet Take 1 tablet (80 mg total) by mouth daily at 6 PM.  . carvedilol (COREG) 25 MG tablet Take 1 tablet (25 mg total) by mouth 2 (two) times daily.  Marland Kitchen doxycycline (VIBRAMYCIN) 100 MG capsule Take 1 capsule (100 mg total) by mouth 2 (two) times daily.  . furosemide (LASIX) 20 MG tablet Take 1 tablet (20 mg total) by mouth daily.  Marland Kitchen HYDROcodone-acetaminophen  (NORCO/VICODIN) 5-325 MG tablet Take 1-2 tablets by mouth every 6 (six) hours as needed for moderate pain or severe pain.  Marland Kitchen ibuprofen (ADVIL) 600 MG tablet Take 1 tablet (600 mg total) by mouth every 6 (six) hours as needed.  Marland Kitchen levofloxacin (LEVAQUIN) 500 MG tablet Take 1 tablet (500 mg total) by mouth daily.  . nitroGLYCERIN (NITROSTAT) 0.4 MG SL tablet Place 1 tablet (0.4 mg total) under the tongue every 5 (five) minutes as needed for up to 3 days for chest pain (CP or SOB).  . pantoprazole (PROTONIX) 40 MG tablet Take 1 tablet (40 mg total) by mouth daily.  Marland Kitchen spironolactone (ALDACTONE) 25 MG tablet Take 1 tablet (25 mg total) by mouth daily.     Allergies:   Patient has no known allergies.   Social History   Socioeconomic History  . Marital status: Divorced    Spouse name: Not on file  . Number of children: Not on file  . Years of education: Not on file  . Highest education level: Not on file  Occupational History  . Occupation: [employer], part-time  Tobacco Use  . Smoking status: Current Some Day Smoker    Packs/day: 0.10    Types: Cigarettes  . Smokeless tobacco: Never Used  Substance and Sexual Activity  . Alcohol use: No  . Drug use: No  . Sexual activity: Not on file  Other Topics Concern  . Not on file  Social History Narrative  . Not on file   Social Determinants  of Health   Financial Resource Strain:   . Difficulty of Paying Living Expenses:   Food Insecurity:   . Worried About Charity fundraiser in the Last Year:   . Arboriculturist in the Last Year:   Transportation Needs:   . Film/video editor (Medical):   Marland Kitchen Lack of Transportation (Non-Medical):   Physical Activity:   . Days of Exercise per Week:   . Minutes of Exercise per Session:   Stress:   . Feeling of Stress :   Social Connections:   . Frequency of Communication with Friends and Family:   . Frequency of Social Gatherings with Friends and Family:   . Attends Religious Services:   . Active  Member of Clubs or Organizations:   . Attends Archivist Meetings:   Marland Kitchen Marital Status:      Family History: The patient's family history includes Dementia in his mother; Diabetes in his brother.  ROS:   Please see the history of present illness.     All other systems reviewed and are negative.  EKGs/Labs/Other Studies Reviewed:     Recent Labs: 12/14/2018: ALT 20; B Natriuretic Peptide 477.3; TSH 0.694 01/11/2019: BUN 7; Creatinine, Ser 0.96; Hemoglobin 13.3; Platelets 320; Potassium 3.7; Sodium 138  Recent Lipid Panel    Component Value Date/Time   CHOL 205 (H) 12/14/2018 0822   TRIG 65 12/14/2018 0822   HDL 46 12/14/2018 0822   CHOLHDL 4.5 12/14/2018 0822   VLDL 13 12/14/2018 0822   LDLCALC 146 (H) 12/14/2018 0822    Physical Exam:    VS:  BP 120/80   Pulse 67   Ht 6' (1.829 m)   Wt 239 lb (108.4 kg)   SpO2 97%   BMI 32.41 kg/m     Wt Readings from Last 3 Encounters:  06/25/19 239 lb (108.4 kg)  01/11/19 222 lb (100.7 kg)  12/29/18 235 lb (106.6 kg)     GEN:  Well nourished, well developed in no acute distress HEENT: Normal NECK: No JVD; No carotid bruits LYMPHATICS: No lymphadenopathy CARDIAC: RRR, no murmurs, rubs, gallops RESPIRATORY:  Clear to auscultation without rales, wheezing or rhonchi  ABDOMEN: Soft, non-tender, non-distended MUSCULOSKELETAL:  No edema; No deformity  SKIN: Warm and dry NEUROLOGIC:  Alert and oriented x 3 PSYCHIATRIC:  Normal affect   ASSESSMENT:    1. Chronic systolic heart failure ([place])   2. Coronary artery disease involving native coronary artery of native heart without angina pectoris   3. History of TIA (transient ischemic attack)   4. Pure hypercholesterolemia    PLAN:    In order of problems listed above:  Chronic systolic heart failure secondary to nonischemic hypertensive cardiomyopathy -Continue to encourage compliance of medications.  Doing well. -Continue with blood work monitoring.  Currently NYHA  class I-II.  Minimal shortness of breath with activity. -In 6 months we will go ahead and repeat echocardiogram  Nonobstructive CAD -Continue with aspirin and statin therapy.  Prior blurry vision -MRI was unremarkable in the hospitalization.  Prior history of TIA -Continue with secondary prevention strategies, aspirin, statin use.  Hyperlipidemia -Continue with high intensity 80 mg of atorvastatin.  LDL previously was 146. -Checking lipid panel.  Liver function.   Medication Adjustments/Labs and Tests Ordered: Current medicines are reviewed at length with the patient today.  Concerns regarding medicines are outlined above.  Orders Placed This Encounter  Procedures  . Comprehensive metabolic panel  . CBC with Differential/Platelet  .  Lipid panel   No orders of the defined types were placed in this encounter.   Patient Instructions  Medication Instructions:  Your physician recommends that you continue on your current medications as directed. Please refer to the Current Medication list given to you today.  *If you need a refill on your cardiac medications before your next appointment, please call your pharmacy*  Lab Work: Your physician recommends that you have lab work today- CMET, CBC, and lipid panel.  If you have labs (blood work) drawn today and your tests are completely normal, you will receive your results only by: Marland Kitchen MyChart Message (if you have MyChart) OR . A paper copy in the mail If you have any lab test that is abnormal or we need to change your treatment, we will call you to review the results.   Testing/Procedures: None ordered today.   Follow-Up: At [facility], you and your health needs are our priority.  As part of our continuing mission to provide you with exceptional heart care, we have created designated Provider Care Teams.  These Care Teams include your primary Cardiologist (physician) and Advanced Practice Providers (APPs -  Physician Assistants  and Nurse Practitioners) who all work together to provide you with the care you need, when you need it.  We recommend signing up for the patient portal called "MyChart".  Sign up information is provided on this After Visit Summary.  MyChart is used to connect with patients for Virtual Visits (Telemedicine).  Patients are able to view lab/test results, encounter notes, upcoming appointments, etc.  Non-urgent messages can be sent to your provider as well.   To learn more about what you can do with MyChart, go to [email or web address].    Your next appointment:   6 month(s)  The format for your next appointment:   In Person  Provider:   You may see Candee Furbish, MD or one of the following Advanced Practice Providers on your designated Care Team:    Truitt Merle, NP  Cecilie Kicks, NP  Kathyrn Drown, NP     Signed, Candee Furbish, MD  06/25/2019 10:06 AM    [place]

## 2019-06-25 NOTE — Patient Instructions (Signed)
Medication Instructions:  Your physician recommends that you continue on your current medications as directed. Please refer to the Current Medication list given to you today.  *If you need a refill on your cardiac medications before your next appointment, please call your pharmacy*  Lab Work: Your physician recommends that you have lab work today- CMET, CBC, and lipid panel.  If you have labs (blood work) drawn today and your tests are completely normal, you will receive your results only by: Marland Kitchen MyChart Message (if you have MyChart) OR . A paper copy in the mail If you have any lab test that is abnormal or we need to change your treatment, we will call you to review the results.   Testing/Procedures: None ordered today.   Follow-Up: At [facility], you and your health needs are our priority.  As part of our continuing mission to provide you with exceptional heart care, we have created designated Provider Care Teams.  These Care Teams include your primary Cardiologist (physician) and Advanced Practice Providers (APPs -  Physician Assistants and Nurse Practitioners) who all work together to provide you with the care you need, when you need it.  We recommend signing up for the patient portal called "MyChart".  Sign up information is provided on this After Visit Summary.  MyChart is used to connect with patients for Virtual Visits (Telemedicine).  Patients are able to view lab/test results, encounter notes, upcoming appointments, etc.  Non-urgent messages can be sent to your provider as well.   To learn more about what you can do with MyChart, go to [email or web address].    Your next appointment:   6 month(s)  The format for your next appointment:   In Person  Provider:   You may see Candee Furbish, MD or one of the following Advanced Practice Providers on your designated Care Team:    Truitt Merle, NP  Cecilie Kicks, NP  Kathyrn Drown, NP

## 2019-07-05 ENCOUNTER — Encounter: Payer: Self-pay | Admitting: *Deleted

## 2019-12-21 IMAGING — US US SCROTUM W/ DOPPLER COMPLETE
1 series · 13 of 25 positions shown · non-contrast
Comparison: None.

CLINICAL DATA: 58-year-old male with left testicular swelling for 3
days.

EXAM:
SCROTAL ULTRASOUND
DOPPLER ULTRASOUND OF THE TESTICLES
TECHNIQUE: Complete ultrasound examination of the testicles, epididymis, and
other scrotal structures was performed. Color and spectral Doppler
ultrasound were also utilized to evaluate blood flow to the
testicles.

[Series 1: us scrotum w/ doppler complete · 13 of 54 slices shown]
[im 1/54]
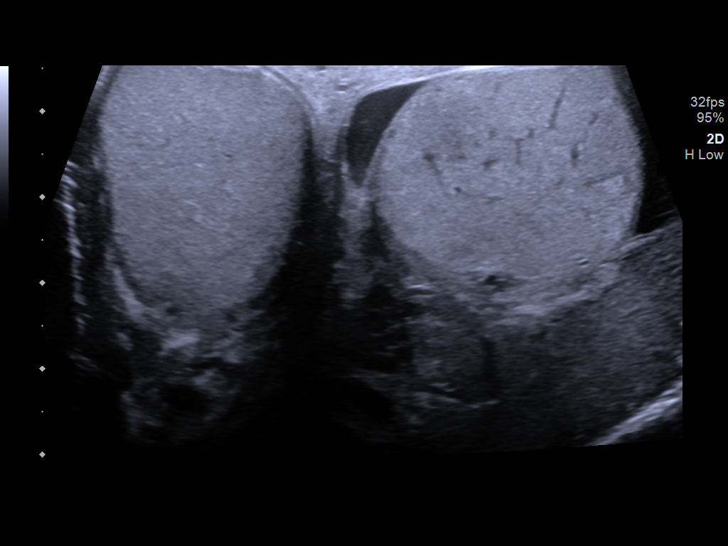
[im 5/54]
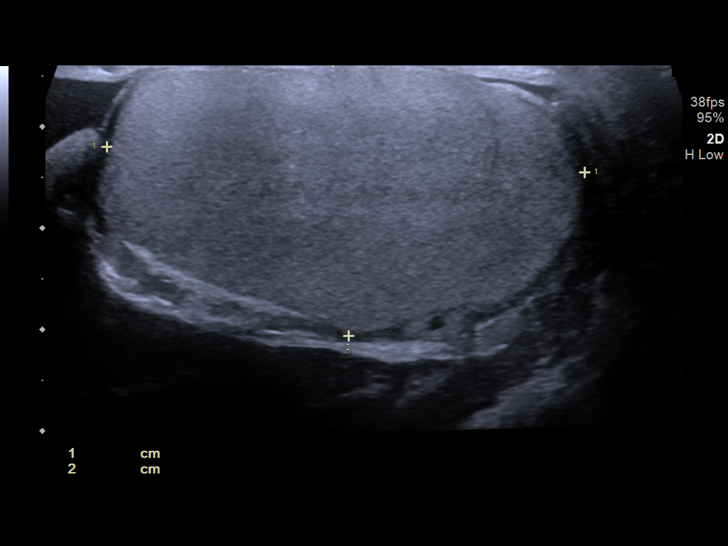
[im 9/54]
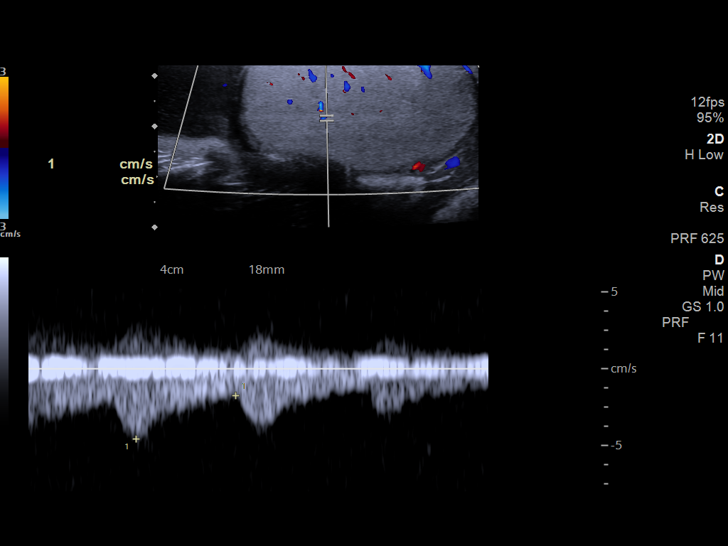
[im 14/54]
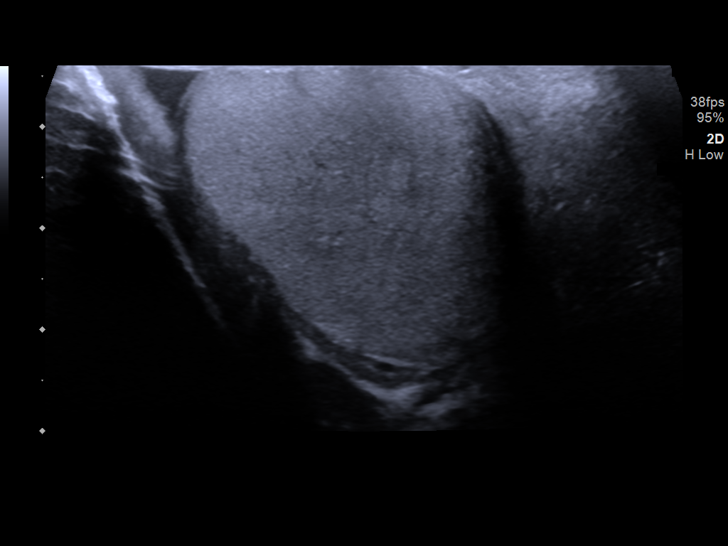
[im 18/54]
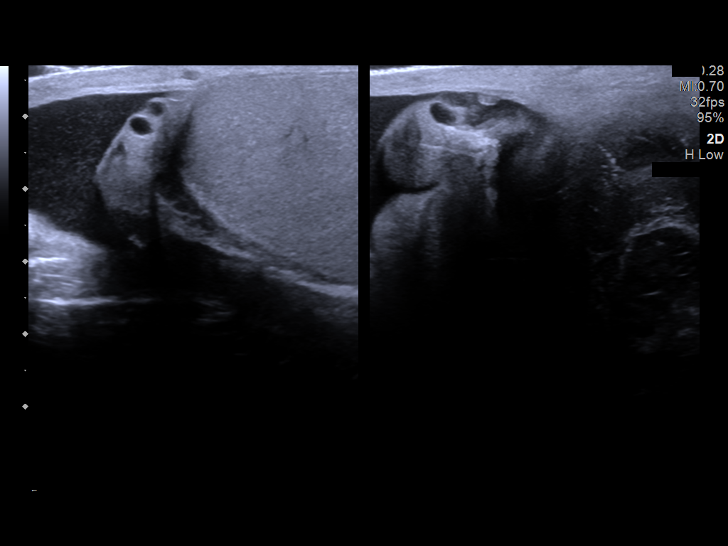
[im 23/54]
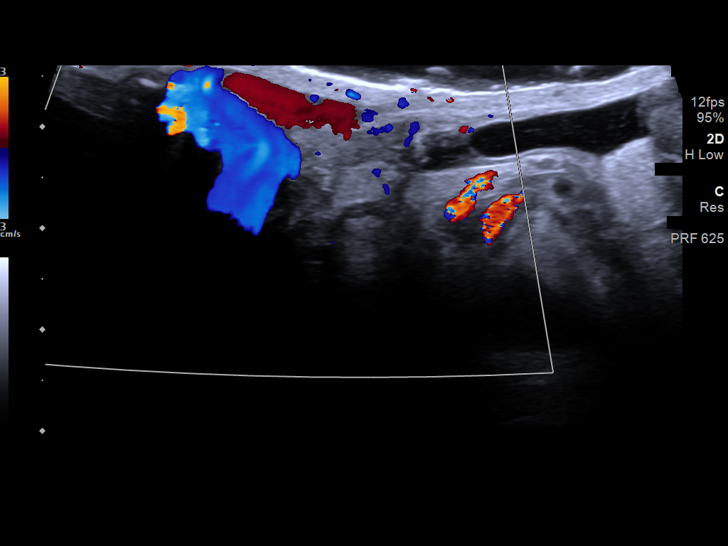
[im 27/54]
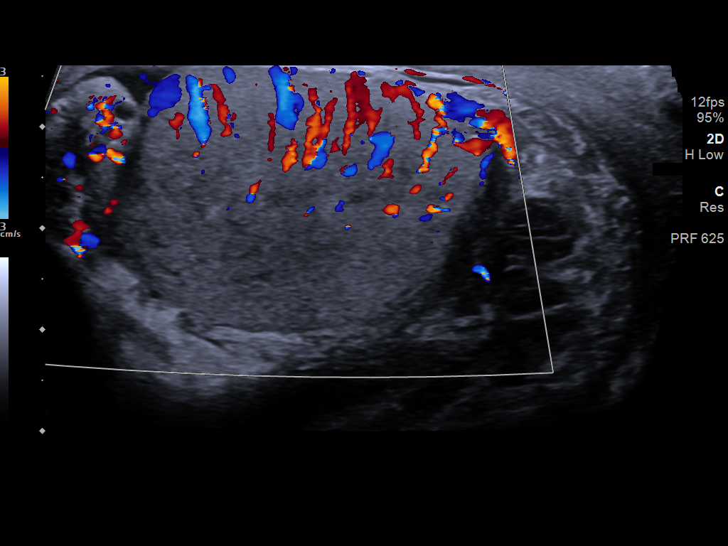
[im 31/54]
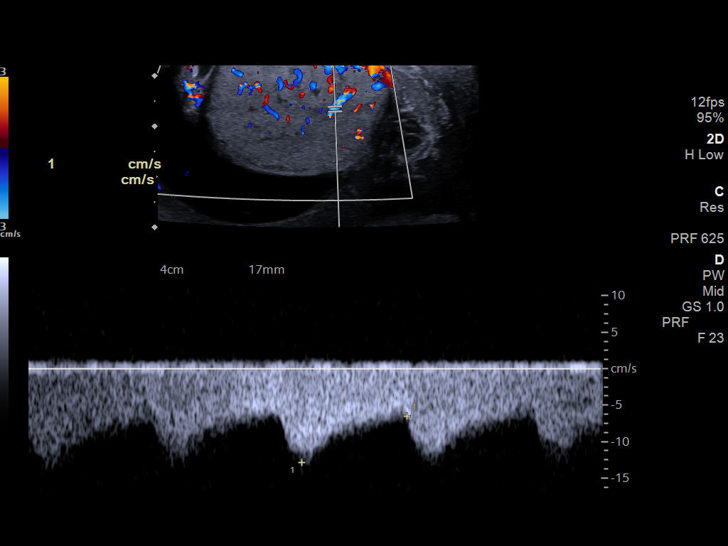
[im 36/54]
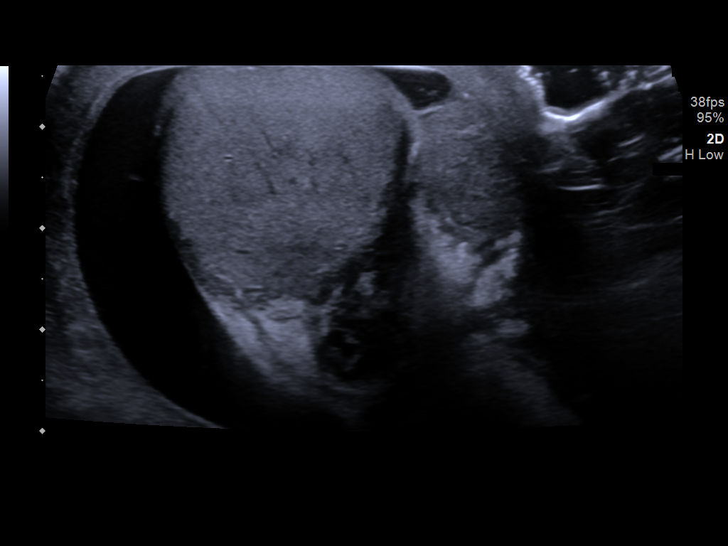
[im 40/54]
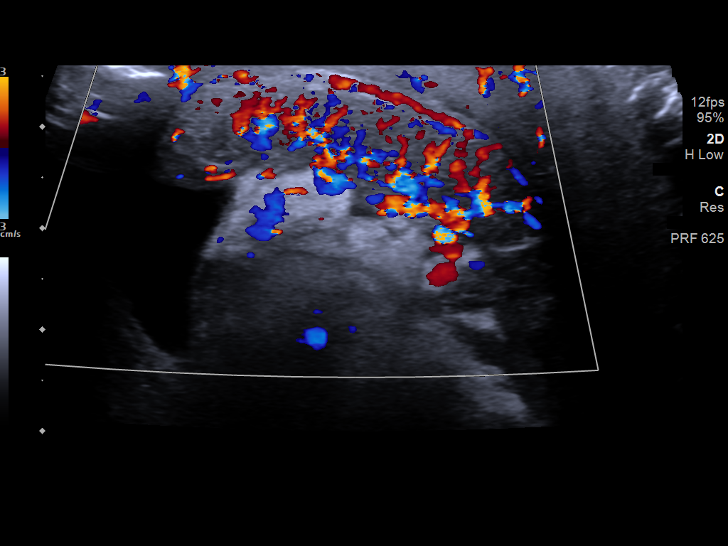
[im 45/54]
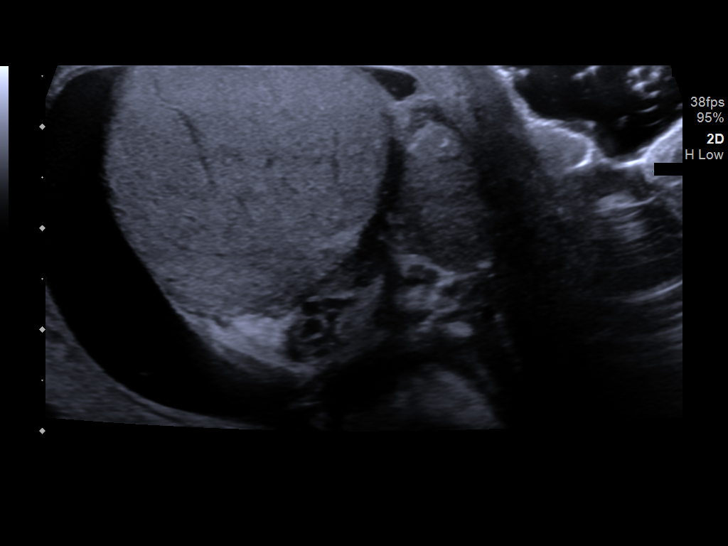
[im 49/54]
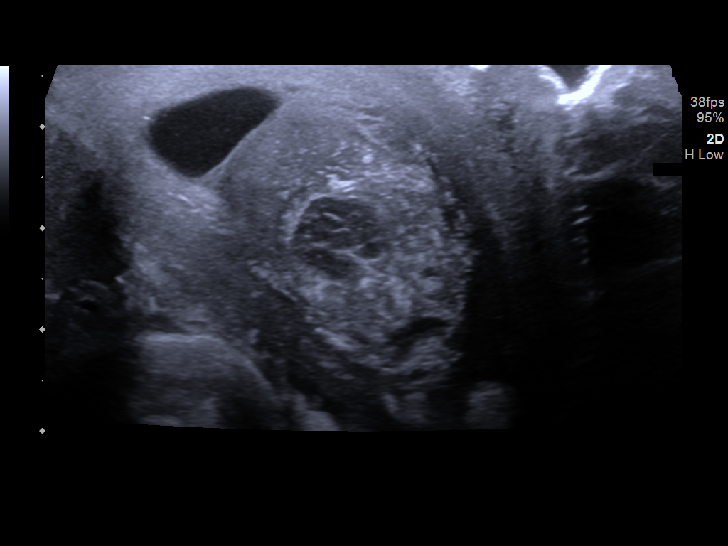
[im 54/54]
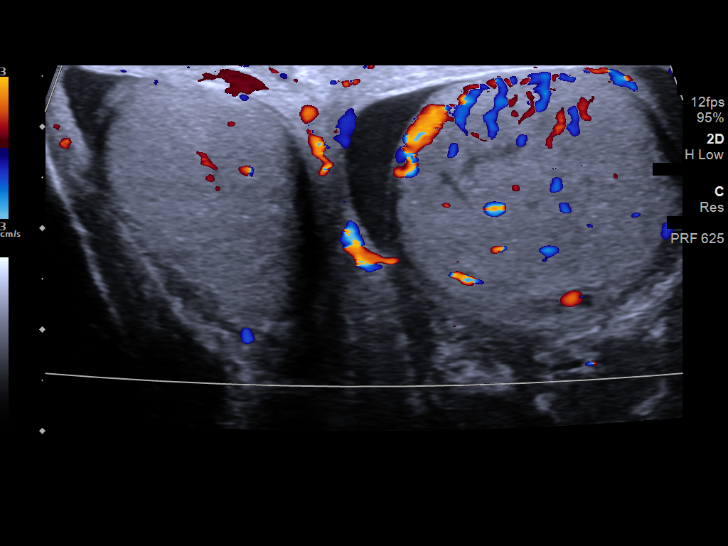

[13 of 25 positions shown; findings below may reference images not displayed]

FINDINGS: Right testicle

Measurements: 4.7 x 2.7 x 3.1 centimeters. No mass or microlithiasis
visualized.

Left testicle

Measurements: 3.6 x 2.8 x 3.0 centimeters. Mildly heterogeneous
echotexture (image 39) with no discrete mass and evidence of
asymmetric hypervascularity on color Doppler (image 40).

Right epididymis:  Normal in size and appearance.

Left epididymis:  Enlarged and hypervascular (image 57).

Hydrocele: Bilateral hydroceles, small to moderate. The left
hydrocele appears simple. There is floating echogenic debris in the
right hydrocele (image 27).

Varicocele: Borderline varicocele on the left (image 70) although
might be artifacts secondary to the left scrotal hypervascularity
(image 74).

Pulsed Doppler interrogation of both testes demonstrates normal low
resistance arterial and venous waveforms bilaterally.
IMPRESSION: 1. Positive for evidence of left side Acute Orchitis And
Epididymitis.
2. Negative for testicular torsion or mass.
3. Positive for small to moderate bilateral hydroceles, probably
postinflammatory.
4. Questionable left side varicocele, favor artifact secondary to
#1.

## 2019-12-29 ENCOUNTER — Other Ambulatory Visit: Payer: Self-pay

## 2019-12-29 ENCOUNTER — Telehealth: Payer: Self-pay | Admitting: Cardiology

## 2019-12-29 MED ORDER — NITROGLYCERIN 0.4 MG SL SUBL: 0.4000 mg | SUBLINGUAL_TABLET | SUBLINGUAL | 3 refills | Status: DC | PRN

## 2019-12-29 MED ORDER — CARVEDILOL 25 MG PO TABS: 25.0000 mg | ORAL_TABLET | Freq: Two times a day (BID) | ORAL | 1 refills | Status: DC

## 2019-12-29 MED ORDER — SPIRONOLACTONE 25 MG PO TABS: 25.0000 mg | ORAL_TABLET | Freq: Every day | ORAL | 0 refills | Status: DC

## 2019-12-29 MED ORDER — SPIRONOLACTONE 25 MG PO TABS: 25.0000 mg | ORAL_TABLET | Freq: Every day | ORAL | 1 refills | Status: DC

## 2019-12-29 NOTE — Telephone Encounter (Signed)
°*  STAT* If patient is at the pharmacy, call can be transferred to refill team.   1. Which medications need to be refilled? (please list name of each medication and dose if known) Entresto, Amlodipine,   carvedilol (COREG) 25 MG tablet  spironolactone (ALDACTONE) 25 MG tablet   2. Which pharmacy/location (including street and city if local pharmacy) is medication to be sent to? [place], [address], [address]  3. Do they need a 30 day or 90 day supply? [place]

## 2020-01-13 ENCOUNTER — Other Ambulatory Visit: Payer: Self-pay

## 2020-01-13 ENCOUNTER — Ambulatory Visit (INDEPENDENT_AMBULATORY_CARE_PROVIDER_SITE_OTHER): Payer: Medicare Other | Admitting: Cardiology

## 2020-01-13 ENCOUNTER — Encounter: Payer: Self-pay | Admitting: Cardiology

## 2020-01-13 VITALS — BP 128/82 | HR 70 | Ht 72.0 in | Wt 224.6 lb

## 2020-01-13 DIAGNOSIS — I5022 Chronic systolic (congestive) heart failure: Secondary | ICD-10-CM | POA: Diagnosis not present

## 2020-01-13 DIAGNOSIS — Z79899 Other long term (current) drug therapy: Secondary | ICD-10-CM

## 2020-01-13 DIAGNOSIS — I2510 Atherosclerotic heart disease of native coronary artery without angina pectoris: Secondary | ICD-10-CM

## 2020-01-13 LAB — BASIC METABOLIC PANEL
BUN/Creatinine Ratio: 11 (ref 9–20)
BUN: 10 mg/dL (ref 6–24)
CO2: 23 mmol/L (ref 20–29)
Calcium: 9.1 mg/dL (ref 8.7–10.2)
Chloride: 105 mmol/L (ref 96–106)
Creatinine, Ser: 0.92 mg/dL (ref 0.76–1.27)
GFR calc Af Amer: 106 mL/min/{1.73_m2} (ref >59)
GFR calc non Af Amer: 91 mL/min/{1.73_m2} (ref >59)
Glucose: 106 mg/dL — ABNORMAL HIGH (ref 65–99)
Potassium: 4.6 mmol/L (ref 3.5–5.2)
Sodium: 141 mmol/L (ref 134–144)

## 2020-01-13 MED ORDER — AMLODIPINE BESYLATE 5 MG PO TABS: 5.0000 mg | ORAL_TABLET | Freq: Every day | ORAL | 3 refills | Status: DC

## 2020-01-13 MED ORDER — ASPIRIN EC 81 MG PO TBEC: 81.0000 mg | DELAYED_RELEASE_TABLET | Freq: Every day | ORAL | 3 refills | Status: AC

## 2020-01-13 MED ORDER — ATORVASTATIN CALCIUM 80 MG PO TABS: 80.0000 mg | ORAL_TABLET | Freq: Every day | ORAL | 3 refills | Status: DC

## 2020-01-13 NOTE — Patient Instructions (Signed)
Medication Instructions:  Please re-start your Atorvastatin 80 mg a day. You may change your Aspirin to 81 mg a day. Continue all other medications as listed.  *If you need a refill on your cardiac medications before your next appointment, please call your pharmacy*  Lab Work: Please have blood work today (BMP)  If you have labs (blood work) drawn today and your tests are completely normal, you will receive your results only by: Marland Kitchen MyChart Message (if you have MyChart) OR . A paper copy in the mail If you have any lab test that is abnormal or we need to change your treatment, we will call you to review the results.  Testing/Procedures: Your physician has requested that you have an echocardiogram. Echocardiography is a painless test that uses sound waves to create images of your heart. It provides your doctor with information about the size and shape of your heart and how well your heart's chambers and valves are working. This procedure takes approximately one hour. There are no restrictions for this procedure.  Follow-Up: At [facility], you and your health needs are our priority.  As part of our continuing mission to provide you with exceptional heart care, we have created designated Provider Care Teams.  These Care Teams include your primary Cardiologist (physician) and Advanced Practice Providers (APPs -  Physician Assistants and Nurse Practitioners) who all work together to provide you with the care you need, when you need it.  We recommend signing up for the patient portal called "MyChart".  Sign up information is provided on this After Visit Summary.  MyChart is used to connect with patients for Virtual Visits (Telemedicine).  Patients are able to view lab/test results, encounter notes, upcoming appointments, etc.  Non-urgent messages can be sent to your provider as well.   To learn more about what you can do with MyChart, go to [email or web address].    Your next appointment:   6  month(s)  The format for your next appointment:   In Person  Provider:   Candee Furbish, MD   Thank you for choosing [facility]!!

## 2020-01-13 NOTE — Progress Notes (Signed)
Cardiology Office Note:    Date:  01/13/2020   ID:  Frank Solomon, DOB Jun 26, 1960, MRN [id number]  PCP:  Cipriano Mile, NP  [facility] Cardiologist:  Candee Furbish, MD  [place] Electrophysiologist:  None   Referring MD: No ref. provider found     History of Present Illness:    Frank Solomon is a 59 y.o. male here for the follow-up of nonischemic cardiomyopathy EF 35%.  Had nonobstructive CAD on heart catheterization in 2020.  Previously had hypertensive urgency blurry vision with MRI that was unremarkable.  Hard to control hypertension.  Cardiomyopathy was diagnosed in 1999.  Past Medical History:  Diagnosis Date  . Cardiomyopathy, dilated ([place])    Diagnosed 1999 had biopsy and also VT  . CHF (congestive heart failure) ([place])   . TIA (transient ischemic attack)     Past Surgical History:  Procedure Laterality Date  . CARDIAC CATHETERIZATION  1999   Dr Wynonia Lawman. per pt, was ok  . LEFT HEART CATH AND CORONARY ANGIOGRAPHY N/A 12/14/2018   Procedure: LEFT HEART CATH AND CORONARY ANGIOGRAPHY;  Surgeon: Sherren Mocha, MD;  Location: [facility];  Service: Cardiovascular;  Laterality: N/A;  . VIDEO ASSISTED THORACOSCOPY (VATS)/DECORTICATION Right 09/07/2013   Procedure: VIDEO ASSISTED THORACOSCOPY (VATS)/ DRAINAGE OF EMPYEMA/ DECORTICATION;  Surgeon: Melrose Nakayama, MD;  Location: [facility];  Service: Thoracic;  Laterality: Right;    Current Medications: Current Meds  Medication Sig  . carvedilol (COREG) 25 MG tablet Take 1 tablet (25 mg total) by mouth 2 (two) times daily.  Marland Kitchen ENTRESTO 97-103 MG Take 1 tablet by mouth 2 (two) times daily.  Marland Kitchen ibuprofen (ADVIL) 600 MG tablet Take 1 tablet (600 mg total) by mouth every 6 (six) hours as needed.  . nitroGLYCERIN (NITROSTAT) 0.4 MG SL tablet Place 1 tablet (0.4 mg total) under the tongue every 5 (five) minutes as needed for up to 3 days for chest pain (CP or SOB).  Marland Kitchen spironolactone (ALDACTONE) 25 MG tablet Take 1  tablet (25 mg total) by mouth daily.  . [DISCONTINUED] amLODipine (NORVASC) 5 MG tablet Take 5 mg by mouth daily.     Allergies:   Patient has no known allergies.   Social History   Socioeconomic History  . Marital status: Divorced    Spouse name: Not on file  . Number of children: Not on file  . Years of education: Not on file  . Highest education level: Not on file  Occupational History  . Occupation: [employer], part-time  Tobacco Use  . Smoking status: Current Some Day Smoker    Packs/day: 0.10    Types: Cigarettes  . Smokeless tobacco: Never Used  Vaping Use  . Vaping Use: Never used  Substance and Sexual Activity  . Alcohol use: No  . Drug use: No  . Sexual activity: Not on file  Other Topics Concern  . Not on file  Social History Narrative  . Not on file   Social Determinants of Health   Financial Resource Strain: Not on file  Food Insecurity: Not on file  Transportation Needs: Not on file  Physical Activity: Not on file  Stress: Not on file  Social Connections: Not on file     Family History: The patient's family history includes Dementia in his mother; Diabetes in his brother.  ROS:   Please see the history of present illness.     All other systems reviewed and are negative.  EKGs/Labs/Other Studies Reviewed:    The  following studies were reviewed today:  ECHO 2020 1. Left ventricular ejection fraction, by visual estimation, is 30 to  35%. The left ventricle has severely decreased function. There is no left  ventricular hypertrophy.  2. Left ventricular diastolic parameters are consistent with Grade II  diastolic dysfunction (pseudonormalization).  3. Moderately dilated left ventricular internal cavity size.  4. The left ventricle demonstrates global hypokinesis.  5. Global right ventricle has normal systolic function.The right  ventricular size is normal. No increase in right ventricular wall  thickness.  6. Left atrial size was mildly dilated.   7. Right atrial size was not well visualized.  8. Trivial pericardial effusion is present.  9. The mitral valve is normal in structure. Trace mitral valve  regurgitation.  10. The tricuspid valve is normal in structure. Tricuspid valve  regurgitation is trivial.  11. The aortic valve is normal in structure. Aortic valve regurgitation is  not visualized. No evidence of aortic valve sclerosis or stenosis.  12. The pulmonic valve was grossly normal. Pulmonic valve regurgitation is  trivial.  13. Aortic dilatation noted.  14. There is mild dilatation of the ascending aorta measuring 37 mm.  15. TR signal is inadequate for assessing pulmonary artery systolic  pressure.  16. The inferior vena cava is normal in size with greater than 50%  respiratory variability, suggesting right atrial pressure of 3 mmHg.   EKG:  EKG is  ordered today.  The ekg ordered today demonstrates sinus rhythm with nonspecific ST-T wave changes  Recent Labs: 06/25/2019: ALT 16; BUN 11; Creatinine, Ser 1.09; Hemoglobin 15.3; Platelets 249; Potassium 5.1; Sodium 139  Recent Lipid Panel    Component Value Date/Time   CHOL 120 06/25/2019 1009   TRIG 68 06/25/2019 1009   HDL 45 06/25/2019 1009   CHOLHDL 2.7 06/25/2019 1009   CHOLHDL 4.5 12/14/2018 0822   VLDL 13 12/14/2018 0822   LDLCALC 61 06/25/2019 1009     Risk Assessment/Calculations:       Physical Exam:    VS:  BP 128/82 (BP Location: Left Arm, Patient Position: Sitting, Cuff Size: Normal)   Pulse 70   Ht 6' (1.829 m)   Wt 224 lb 9.6 oz (101.9 kg)   SpO2 98%   BMI 30.46 kg/m     Wt Readings from Last 3 Encounters:  01/13/20 224 lb 9.6 oz (101.9 kg)  06/25/19 239 lb (108.4 kg)  01/11/19 222 lb (100.7 kg)     GEN:  Well nourished, well developed in no acute distress HEENT: Normal NECK: No JVD; No carotid bruits LYMPHATICS: No lymphadenopathy CARDIAC: RRR, no murmurs, rubs, gallops RESPIRATORY:  Clear to auscultation without rales,  wheezing or rhonchi  ABDOMEN: Soft, non-tender, non-distended MUSCULOSKELETAL:  No edema; No deformity  SKIN: Warm and dry NEUROLOGIC:  Alert and oriented x 3 PSYCHIATRIC:  Normal affect   ASSESSMENT:    1. Chronic systolic heart failure ([place])   2. Coronary artery disease involving native coronary artery of native heart without angina pectoris   3. Medication management    PLAN:    In order of problems listed above:  Chronic systolic heart failure secondary to nonischemic hypertensive cardiomyopathy -NYHA class I-II minimal shortness of breath. -Taking Entresto high-dose, potassium 5.1 creatinine 1.09 -Spironolactone, carvedilol excellent. Also taking amlodipine. Blood pressure today 128/82. Much improved. We will check lab work today since his been a few months.  Nonobstructive CAD -Continue with aspirin and statin therapy  Prior history of TIA -Aspirin statin prevention strategies  hypertension control  Hyperlipidemia -On 80 mg of atorvastatin.  LDL 61.  Excellent. There was some confusion around his atorvastatin. He may not have been taking it for a while he thought he was to stop that with the Entresto. We have restarted it.   Medication Adjustments/Labs and Tests Ordered: Current medicines are reviewed at length with the patient today.  Concerns regarding medicines are outlined above.  Orders Placed This Encounter  Procedures  . Basic metabolic panel  . EKG 12-Lead  . ECHOCARDIOGRAM COMPLETE   Meds ordered this encounter  Medications  . atorvastatin (LIPITOR) 80 MG tablet    Sig: Take 1 tablet (80 mg total) by mouth daily.    Dispense:  90 tablet    Refill:  3  . aspirin EC 81 MG tablet    Sig: Take 1 tablet (81 mg total) by mouth daily. Swallow whole.    Dispense:  90 tablet    Refill:  3    Patient Instructions  Medication Instructions:  Please re-start your Atorvastatin 80 mg a day. You may change your Aspirin to 81 mg a day. Continue all other  medications as listed.  *If you need a refill on your cardiac medications before your next appointment, please call your pharmacy*  Lab Work: Please have blood work today (BMP)  If you have labs (blood work) drawn today and your tests are completely normal, you will receive your results only by: Marland Kitchen MyChart Message (if you have MyChart) OR . A paper copy in the mail If you have any lab test that is abnormal or we need to change your treatment, we will call you to review the results.  Testing/Procedures: Your physician has requested that you have an echocardiogram. Echocardiography is a painless test that uses sound waves to create images of your heart. It provides your doctor with information about the size and shape of your heart and how well your heart's chambers and valves are working. This procedure takes approximately one hour. There are no restrictions for this procedure.  Follow-Up: At [facility], you and your health needs are our priority.  As part of our continuing mission to provide you with exceptional heart care, we have created designated Provider Care Teams.  These Care Teams include your primary Cardiologist (physician) and Advanced Practice Providers (APPs -  Physician Assistants and Nurse Practitioners) who all work together to provide you with the care you need, when you need it.  We recommend signing up for the patient portal called "MyChart".  Sign up information is provided on this After Visit Summary.  MyChart is used to connect with patients for Virtual Visits (Telemedicine).  Patients are able to view lab/test results, encounter notes, upcoming appointments, etc.  Non-urgent messages can be sent to your provider as well.   To learn more about what you can do with MyChart, go to [email or web address].    Your next appointment:   6 month(s)  The format for your next appointment:   In Person  Provider:   Candee Furbish, MD   Thank you for choosing [facility]!!         Signed, Candee Furbish, MD  01/13/2020 11:15 AM    [place]

## 2020-01-18 ENCOUNTER — Other Ambulatory Visit: Payer: Self-pay

## 2020-01-18 MED ORDER — ENTRESTO 97-103 MG PO TABS: 1.0000 | ORAL_TABLET | Freq: Two times a day (BID) | ORAL | 3 refills | Status: DC

## 2020-01-19 ENCOUNTER — Telehealth: Payer: Self-pay | Admitting: Cardiology

## 2020-01-19 MED ORDER — ENTRESTO 97-103 MG PO TABS: 1.0000 | ORAL_TABLET | Freq: Two times a day (BID) | ORAL | 0 refills | Status: DC

## 2020-01-19 NOTE — Telephone Encounter (Signed)
Patient calling the office for samples of medication:   1.  What medication and dosage are you requesting samples for? ENTRESTO 97-103 MG  2.  Are you currently out of this medication? Yes    If no samples are available please send a 10-14 day supply to Frank Solomon, Frank Solomon, until he can get his prescription from [facility]

## 2020-01-19 NOTE — Telephone Encounter (Signed)
Ok will do, leaving pt 1 bottle of Entresto 49-51 mg tablet samples for pt to pick up at the office. Lot# [id number] Exp: 12/2021

## 2020-01-19 NOTE — Telephone Encounter (Signed)
**Note De-Identified Frank Solomon Obfuscation** I s/w the patient and he is aware that we are leaving him 1 sample bottle of Entresto 49-51 mg in the front office at [facility] [address] office for him to pick up. I did advise him to be sure to take two of the Entresto 49-51 mg tablets BID as we do not have samples in his normal dose of 97-103 mg. He is aware to call us back if he has not received his Entresto Metztli Sachdev mail prior to him running out. He verbalized understanding and thanked me for our assistance with this.

## 2020-01-19 NOTE — Telephone Encounter (Signed)
Pt called in and stated that the 15 day supply of Delene Loll is going to cost him $370.  He would like to know if he has any other options

## 2020-01-19 NOTE — Telephone Encounter (Signed)
Hey Lynn,LPN, I called the pharmacy and they stated that pt's medication was shipped out this morning. Pt states that he does not have enough but to take 1 tablet daily. I explained to the pt that we do not have samples of entresto 97-103 mg tablet. Please advise on this matter. Thanks

## 2020-01-19 NOTE — Telephone Encounter (Signed)
Called pt to inform him that we do not have samples of entresto 97-103 mg tablets and that I was sending a 15 day supply to his local pharmacy as requested, until mail order arrives. I advised pt that if he has any other problems, questions or concerns, to give our office a call back. Pt verbalized understanding.

## 2020-02-10 ENCOUNTER — Other Ambulatory Visit: Payer: Self-pay | Admitting: Cardiology

## 2020-02-10 ENCOUNTER — Ambulatory Visit (HOSPITAL_COMMUNITY): Payer: Medicare Other | Attending: Cardiology

## 2020-02-10 ENCOUNTER — Other Ambulatory Visit: Payer: Self-pay

## 2020-02-10 DIAGNOSIS — I5022 Chronic systolic (congestive) heart failure: Secondary | ICD-10-CM | POA: Diagnosis present

## 2020-02-10 LAB — ECHOCARDIOGRAM COMPLETE
Area-P 1/2: 3.17 cm2
S' Lateral: 4.5 cm

## 2020-03-27 ENCOUNTER — Ambulatory Visit (INDEPENDENT_AMBULATORY_CARE_PROVIDER_SITE_OTHER): Payer: Medicare Other | Admitting: Podiatry

## 2020-03-27 ENCOUNTER — Encounter: Payer: Self-pay | Admitting: Podiatry

## 2020-03-27 ENCOUNTER — Other Ambulatory Visit: Payer: Self-pay

## 2020-03-27 DIAGNOSIS — M21611 Bunion of right foot: Secondary | ICD-10-CM

## 2020-03-27 DIAGNOSIS — R52 Pain, unspecified: Secondary | ICD-10-CM

## 2020-03-27 DIAGNOSIS — M2141 Flat foot [pes planus] (acquired), right foot: Secondary | ICD-10-CM

## 2020-03-27 DIAGNOSIS — L84 Corns and callosities: Secondary | ICD-10-CM

## 2020-03-27 DIAGNOSIS — M21612 Bunion of left foot: Secondary | ICD-10-CM

## 2020-03-27 DIAGNOSIS — M2012 Hallux valgus (acquired), left foot: Secondary | ICD-10-CM

## 2020-03-27 DIAGNOSIS — M2142 Flat foot [pes planus] (acquired), left foot: Secondary | ICD-10-CM

## 2020-03-27 DIAGNOSIS — M2011 Hallux valgus (acquired), right foot: Secondary | ICD-10-CM

## 2020-03-27 DIAGNOSIS — R7303 Prediabetes: Secondary | ICD-10-CM

## 2020-03-27 NOTE — Progress Notes (Signed)
  Subjective:  Patient ID: Frank Solomon, male    DOB: 15-Oct-1960,  MRN: [id number]  Chief Complaint  Patient presents with  . Diabetes    Diabetic foot exam. Callus     60 y.o. male presents with the above complaint. History confirmed with patient.  He has prediabetes and is diet controlled.  His PCP sent him for a diabetic foot exam.  He has multiple painful calluses as well.  Objective:  Physical Exam: warm, good capillary refill, no trophic changes or ulcerative lesions, normal DP and PT pulses, normal monofilament exam and normal sensory exam.  Currently there is bilateral submetatarsal 5, medial hallux hyperkeratoses.   Assessment:   1. Prediabetes   2. Callus of foot   3. Pain   4. Pes planus of both feet      Plan:  Patient was evaluated and treated and all questions answered.  Patient educated on diabetes. Discussed proper diabetic foot care and discussed risks and complications of disease. Educated patient in depth on reasons to return to the office immediately should he/she discover anything concerning or new on the feet. All questions answered. Discussed proper shoes as well.   All symptomatic hyperkeratoses were safely debrided with a sterile #15 blade to patient's level of comfort without incident. We discussed preventative and palliative care of these lesions including supportive and accommodative shoegear, padding, prefabricated and custom molded accommodative orthoses, use of a pumice stone and lotions/creams daily.   Return if symptoms worsen or fail to improve.

## 2020-03-27 NOTE — Patient Instructions (Addendum)
Look for urea 40% cream or ointment and apply to the thickened dry skin / calluses. This can be bought over the counter, at a pharmacy or online such as [employer].  Corns and Calluses Corns are small areas of thickened skin that form on the top, sides, or tip of a toe. Corns have a cone-shaped core with a point that can press on a nerve below. This causes pain. Calluses are areas of thickened skin that can form anywhere on the body, including the hands, fingers, palms, soles of the feet, and heels. Calluses are usually larger than corns. What are the causes? Corns and calluses are caused by rubbing (friction) or pressure, such as from shoes that are too tight or do not fit properly. What increases the risk? Corns are more likely to develop in people who have misshapen toes (toe deformities), such as hammer toes. Calluses can form with friction to any area of the skin. They are more likely to develop in people who:  Work with their hands.  Wear shoes that fit poorly, are too tight, or are high-heeled.  Have toe deformities. What are the signs or symptoms? Symptoms of a corn or callus include:  A hard growth on the skin.  Pain or tenderness under the skin.  Redness and swelling.  Increased discomfort while wearing tight-fitting shoes, if your feet are affected. If a corn or callus becomes infected, symptoms may include:  Redness and swelling that gets worse.  Pain.  Fluid, blood, or pus draining from the corn or callus.   How is this diagnosed? Corns and calluses may be diagnosed based on your symptoms, your medical history, and a physical exam. How is this treated? Treatment for corns and calluses may include:  Removing the cause of the friction or pressure. This may involve: ? Changing your shoes. ? Wearing shoe inserts (orthotics) or other protective layers in your shoes, such as a corn pad. ? Wearing gloves.  Applying medicine to the skin (topical medicine) to help soften  skin in the hardened, thickened areas.  Removing layers of dead skin with a file to reduce the size of the corn or callus.  Removing the corn or callus with a scalpel or laser.  Taking antibiotic medicines, if your corn or callus is infected.  Having surgery, if a toe deformity is the cause. Follow these instructions at home:  Take over-the-counter and prescription medicines only as told by your health care provider.  If you were prescribed an antibiotic medicine, take it as told by your health care provider. Do not stop taking it even if your condition improves.  Wear shoes that fit well. Avoid wearing high-heeled shoes and shoes that are too tight or too loose.  Wear any padding, protective layers, gloves, or orthotics as told by your health care provider.  Soak your hands or feet. Then use a file or pumice stone to soften your corn or callus. Do this as told by your health care provider.  Check your corn or callus every day for signs of infection.   Contact a health care provider if:  Your symptoms do not improve with treatment.  You have redness or swelling that gets worse.  Your corn or callus becomes painful.  You have fluid, blood, or pus coming from your corn or callus.  You have new symptoms. Get help right away if:  You develop severe pain with redness. Summary  Corns are small areas of thickened skin that form on the top, sides,   or tip of a toe. These can be painful.  Calluses are areas of thickened skin that can form anywhere on the body, including the hands, fingers, palms, and soles of the feet. Calluses are usually larger than corns.  Corns and calluses are caused by rubbing (friction) or pressure, such as from shoes that are too tight or do not fit properly.  Treatment may include wearing padding, protective layers, gloves, or orthotics as told by your health care provider. This information is not intended to replace advice given to you by your health care  provider. Make sure you discuss any questions you have with your health care provider. Document Revised: 05/13/2019 Document Reviewed: 05/13/2019 Elsevier Patient Education  2021 [employer]  

## 2020-07-05 ENCOUNTER — Ambulatory Visit (INDEPENDENT_AMBULATORY_CARE_PROVIDER_SITE_OTHER): Payer: Medicare Other | Admitting: Cardiology

## 2020-07-05 ENCOUNTER — Other Ambulatory Visit: Payer: Self-pay

## 2020-07-05 ENCOUNTER — Encounter: Payer: Self-pay | Admitting: Cardiology

## 2020-07-05 VITALS — BP 110/70 | HR 70 | Ht 72.0 in | Wt 216.0 lb

## 2020-07-05 DIAGNOSIS — Z8673 Personal history of transient ischemic attack (TIA), and cerebral infarction without residual deficits: Secondary | ICD-10-CM

## 2020-07-05 DIAGNOSIS — I2510 Atherosclerotic heart disease of native coronary artery without angina pectoris: Secondary | ICD-10-CM

## 2020-07-05 DIAGNOSIS — I5022 Chronic systolic (congestive) heart failure: Secondary | ICD-10-CM | POA: Diagnosis not present

## 2020-07-05 LAB — COMPREHENSIVE METABOLIC PANEL
ALT: 40 IU/L (ref 0–44)
AST: 45 IU/L — ABNORMAL HIGH (ref 0–40)
Albumin/Globulin Ratio: 1.6 (ref 1.2–2.2)
Albumin: 3.9 g/dL (ref 3.8–4.9)
Alkaline Phosphatase: 120 IU/L (ref 44–121)
BUN/Creatinine Ratio: 12 (ref 9–20)
BUN: 12 mg/dL (ref 6–24)
Bilirubin Total: 0.8 mg/dL (ref 0.0–1.2)
CO2: 21 mmol/L (ref 20–29)
Calcium: 8.9 mg/dL (ref 8.7–10.2)
Chloride: 105 mmol/L (ref 96–106)
Creatinine, Ser: 0.99 mg/dL (ref 0.76–1.27)
Globulin, Total: 2.5 g/dL (ref 1.5–4.5)
Glucose: 105 mg/dL — ABNORMAL HIGH (ref 65–99)
Potassium: 4.1 mmol/L (ref 3.5–5.2)
Sodium: 140 mmol/L (ref 134–144)
Total Protein: 6.4 g/dL (ref 6.0–8.5)
eGFR: 88 mL/min/{1.73_m2} (ref >59)

## 2020-07-05 LAB — LIPID PANEL
Chol/HDL Ratio: 2.3 ratio (ref 0.0–5.0)
Cholesterol, Total: 100 mg/dL (ref 100–199)
HDL: 44 mg/dL (ref >39)
LDL Chol Calc (NIH): 37 mg/dL (ref 0–99)
Triglycerides: 100 mg/dL (ref 0–149)
VLDL Cholesterol Cal: 19 mg/dL (ref 5–40)

## 2020-07-05 MED ORDER — ATORVASTATIN CALCIUM 80 MG PO TABS: 80.0000 mg | ORAL_TABLET | Freq: Every day | ORAL | 3 refills | Status: DC

## 2020-07-05 MED ORDER — AMLODIPINE BESYLATE 5 MG PO TABS: 5.0000 mg | ORAL_TABLET | Freq: Every day | ORAL | 3 refills | Status: DC

## 2020-07-05 NOTE — Progress Notes (Signed)
Cardiology Office Note:    Date:  07/05/2020   ID:  Frank Solomon, DOB 1961-01-25, MRN [id number]  PCP:  Cipriano Mile, NP   [facility] Providers Cardiologist:  Candee Furbish, MD     Referring MD: Cipriano Mile, NP   History of Present Illness:    Frank Solomon is a 60 y.o. male is here today to a follow-up for hypertension and nonischemic cardiomyopathy.  Today he [place] feeling well.   He is tolerating his medications. His blood pressure have improved. He has requested for refills.  BP Readings from Last 3 Encounters:  07/05/20 110/70  01/13/20 128/82  06/25/19 120/80   He denies having any exertional shortness of breath, chest pain, tightness, or pressure. He has no lightheadedness, LE edema, syncopal episodes, PND, orthopnea and spontaneous bleeding.   Past Medical History:  Diagnosis Date  . 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 CHF (congestive heart failure) ([place])   . TIA (transient ischemic attack)     Past Surgical History:  Procedure Laterality Date  . CARDIAC CATHETERIZATION  1999   Dr Wynonia Lawman. per pt, was ok  . LEFT HEART CATH AND CORONARY ANGIOGRAPHY N/A 12/14/2018   Procedure: LEFT HEART CATH AND CORONARY ANGIOGRAPHY;  Surgeon: Sherren Mocha, MD;  Location: [place] [facility];  Service: Cardiovascular;  Laterality: N/A;  . VIDEO ASSISTED THORACOSCOPY (VATS)/DECORTICATION Right 09/07/2013   Procedure: VIDEO ASSISTED THORACOSCOPY (VATS)/ DRAINAGE OF EMPYEMA/ DECORTICATION;  Surgeon: Melrose Nakayama, MD;  Location: [facility];  Service: Thoracic;  Laterality: Right;    Current Medications: Current Meds  Medication Sig  . aspirin EC 81 MG tablet Take 1 tablet (81 mg total) by mouth daily. Swallow whole.  . carvedilol (COREG) 25 MG tablet Take 1 tablet (25 mg total) by mouth 2 (two) times daily.  Marland Kitchen ENTRESTO 97-103 MG Take 1 tablet by mouth 2 (two) times daily.  Marland Kitchen ibuprofen (ADVIL) 600 MG tablet Take 1 tablet (600 mg total) by  mouth every 6 (six) hours as needed.  . nitroGLYCERIN (NITROSTAT) 0.4 MG SL tablet DISSOLVE 1 TABLET UNDER THE TONGUE EVERY 5 MINUTES AS  NEEDED FOR CHEST PAIN. MAX  OF 3 TABLETS IN 15 MINUTES. CALL 911 IF PAIN PERSISTS.  . [DISCONTINUED] amLODipine (NORVASC) 5 MG tablet Take 1 tablet (5 mg total) by mouth daily.  . [DISCONTINUED] atorvastatin (LIPITOR) 80 MG tablet Take 1 tablet (80 mg total) by mouth daily.     Allergies:   Patient has no known allergies.   Social History   Socioeconomic History  . Marital status: Divorced    Spouse name: Not on file  . Number of children: Not on file  . Years of education: Not on file  . Highest education level: Not on file  Occupational History  . Occupation: [employer], part-time  Tobacco Use  . Smoking status: Current Some Day Smoker    Packs/day: 0.10    Types: Cigarettes  . Smokeless tobacco: Never Used  Vaping Use  . Vaping Use: Never used  Substance and Sexual Activity  . Alcohol use: No  . Drug use: No  . Sexual activity: Not on file  Other Topics Concern  . Not on file  Social History Narrative  . Not on file   Social Determinants of Health   Financial Resource Strain: Not on file  Food Insecurity: Not on file  Transportation Needs: Not on file  Physical Activity: Not on file  Stress: Not  on file  Social Connections: Not on file     Family History: The patient's family history includes Dementia in his mother; Diabetes in his brother.  ROS:   Please see the history of present illness. All other systems reviewed and are negative.  EKGs/Labs/Other Studies Reviewed:    The following studies were reviewed today: Echo 02/10/2020 Impressions 1. Left ventricular ejection fraction, by estimation, is 40 to 45%. The  left ventricle has mildly decreased function. The left ventricle  demonstrates global hypokinesis. The left ventricular internal cavity size  was mildly dilated. Left ventricular  diastolic parameters are consistent  with Grade I diastolic dysfunction  (impaired relaxation).  2. Right ventricular systolic function is normal. The right ventricular  size is normal.  3. Left atrial size was mildly dilated.  4. The mitral valve is normal in structure. No evidence of mitral valve  regurgitation. No evidence of mitral stenosis.  5. The aortic valve is normal in structure. Aortic valve regurgitation is  not visualized. No aortic stenosis is present.  6. The inferior vena cava is normal in size with greater than 50%  respiratory variability, suggesting right atrial pressure of 3 mmHg.   Left Heart Cath 12/14/2018    DG Chest Impressions 1. Chronic pleuroparenchymal scarring at the right lung base. No acute abnormality. 2. Borderline hyperinflation.   EKG:  EKG not ordered today  Recent Labs: 01/13/2020: BUN 10; Creatinine, Ser 0.92; Potassium 4.6; Sodium 141  Recent Lipid Panel    Component Value Date/Time   CHOL 120 06/25/2019 1009   TRIG 68 06/25/2019 1009   HDL 45 06/25/2019 1009   CHOLHDL 2.7 06/25/2019 1009   CHOLHDL 4.5 12/14/2018 0822   VLDL 13 12/14/2018 0822   LDLCALC 61 06/25/2019 1009     Risk Assessment/Calculations:      Physical Exam:    VS:  BP 110/70 (BP Location: Left Arm, Patient Position: Sitting, Cuff Size: Normal)   Pulse 70   Ht 6' (1.829 m)   Wt 216 lb (98 kg)   SpO2 96%   BMI 29.29 kg/m     Wt Readings from Last 3 Encounters:  07/05/20 216 lb (98 kg)  01/13/20 224 lb 9.6 oz (101.9 kg)  06/25/19 239 lb (108.4 kg)     QZR:AQTM nourished, well developed in no acute distress HEENT: Normal NECK: No JVD; No carotid bruits LYMPHATICS: No lymphadenopathy CARDIAC:RRR, no murmurs, rubs, gallops RESPIRATORY:  Clear to auscultation without rales, wheezing or rhonchi  ABDOMEN: Soft, non-tender, non-distended MUSCULOSKELETAL:  No edema; No deformity  SKIN: Warm and dry NEUROLOGIC:  Alert and oriented x 3 PSYCHIATRIC:  Normal affect   ASSESSMENT:     1. Chronic systolic heart failure ([place])   2. Coronary artery disease involving native coronary artery of native heart without angina pectoris   3. History of TIA (transient ischemic attack)    PLAN:    In order of problems listed above:  Chronic systolic heart failure secondary to nonischemic hypertensive cardiomyopathy - Currently doing very well with NYHA class I type symptoms.  Continue with current medical management with Entresto high-dose.  We will check lab work today.  Refills have been prescribed.  He is also on spironolactone carvedilol at goal dose of 25 mg twice a day.  Nonobstructive CAD Continue with atorvastatin 80 mg once a day.  Excellent.  Prior history of TIA No recent strokelike symptoms.  Hyperlipidemia Continue with atorvastatin 80 mg.  LDL 61.  Checking labs.  Medication Adjustments/Labs and Tests Ordered: Current medicines are reviewed at length with the patient today.  Concerns regarding medicines are outlined above.  Orders Placed This Encounter  Procedures  . Comprehensive metabolic panel  . Lipid panel   No orders of the defined types were placed in this encounter.   Patient Instructions  Medication Instructions:  Your physician recommends that you continue on your current medications as directed. Please refer to the Current Medication list given to you today.  *If you need a refill on your cardiac medications before your next appointment, please call your pharmacy*   Lab Work: CMET and LIPID PROFILE today. If you have labs (blood work) drawn today and your tests are completely normal, you will receive your results only by: Marland Kitchen MyChart Message (if you have MyChart) OR . A paper copy in the mail If you have any lab test that is abnormal or we need to change your treatment, we will call you to review the results.   Testing/Procedures: None   Follow-Up: At [facility], you and your health needs are our priority.  As part of our  continuing mission to provide you with exceptional heart care, we have created designated Provider Care Teams.  These Care Teams include your primary Cardiologist (physician) and Advanced Practice Providers (APPs -  Physician Assistants and Nurse Practitioners) who all work together to provide you with the care you need, when you need it.  We recommend signing up for the patient portal called "MyChart".  Sign up information is provided on this After Visit Summary.  MyChart is used to connect with patients for Virtual Visits (Telemedicine).  Patients are able to view lab/test results, encounter notes, upcoming appointments, etc.  Non-urgent messages can be sent to your provider as well.   To learn more about what you can do with MyChart, go to [email or web address].    Your next appointment:   6 month(s)  The format for your next appointment:   In Person  Provider:   You may see Candee Furbish, MD or one of the following Advanced Practice Providers on your designated Care Team:    Kathyrn Drown, NP    Other Instructions       I,Essence Turner,acting as a scribe for Candee Furbish, MD.,have documented all relevant documentation on the behalf of Candee Furbish, MD,as directed by  Candee Furbish, MD while in the presence of Candee Furbish, MD.   Signed, Candee Furbish, MD  07/05/2020 10:16 AM    [place]

## 2020-07-05 NOTE — Patient Instructions (Signed)
Medication Instructions:  Your physician recommends that you continue on your current medications as directed. Please refer to the Current Medication list given to you today.  *If you need a refill on your cardiac medications before your next appointment, please call your pharmacy*   Lab Work: CMET and LIPID PROFILE today. If you have labs (blood work) drawn today and your tests are completely normal, you will receive your results only by: Marland Kitchen MyChart Message (if you have MyChart) OR . A paper copy in the mail If you have any lab test that is abnormal or we need to change your treatment, we will call you to review the results.   Testing/Procedures: None   Follow-Up: At [facility], you and your health needs are our priority.  As part of our continuing mission to provide you with exceptional heart care, we have created designated Provider Care Teams.  These Care Teams include your primary Cardiologist (physician) and Advanced Practice Providers (APPs -  Physician Assistants and Nurse Practitioners) who all work together to provide you with the care you need, when you need it.  We recommend signing up for the patient portal called "MyChart".  Sign up information is provided on this After Visit Summary.  MyChart is used to connect with patients for Virtual Visits (Telemedicine).  Patients are able to view lab/test results, encounter notes, upcoming appointments, etc.  Non-urgent messages can be sent to your provider as well.   To learn more about what you can do with MyChart, go to [email or web address].    Your next appointment:   6 month(s)  The format for your next appointment:   In Person  Provider:   You may see Candee Furbish, MD or one of the following Advanced Practice Providers on your designated Care Team:    Kathyrn Drown, NP    Other Instructions

## 2020-07-20 ENCOUNTER — Other Ambulatory Visit: Payer: Self-pay | Admitting: Cardiology

## 2020-07-27 ENCOUNTER — Telehealth: Payer: Self-pay | Admitting: Cardiology

## 2020-07-27 MED ORDER — ENTRESTO 97-103 MG PO TABS: 1.0000 | ORAL_TABLET | Freq: Two times a day (BID) | ORAL | 0 refills | Status: DC

## 2020-07-27 MED ORDER — ENTRESTO 97-103 MG PO TABS: 1.0000 | ORAL_TABLET | Freq: Two times a day (BID) | ORAL | 3 refills | Status: DC

## 2020-07-27 NOTE — Telephone Encounter (Signed)
Wyonia Hough, LPN, can you advise on this matter please? Thanks

## 2020-07-27 NOTE — Telephone Encounter (Signed)
**Note De-Identified Liron Eissler Obfuscation** I am unsure who sent his Raiford Noble to his mail pharmacy to fill as the last refill that we e-scribed was to [facility] for a 15 day supply with no refills on 03/22/19.  I called the pt X 2 but got no answer and could not leave a message as his VM message states that his mailbox is full.

## 2020-07-27 NOTE — Telephone Encounter (Signed)
Patient calling the office for samples of medication:   1.  What medication and dosage are you requesting samples for? ENTRESTO 97-103 MG  2.  Are you currently out of this medication?    Yes, patient is completely out. He states he received a notification from [facility] stating his mail order prescription won't arrive until 08/03/20. He states for a temporary supply from his local pharmacy it will cost him about $300.

## 2020-07-27 NOTE — Telephone Encounter (Signed)
I resent his medication to both [facility] mail order pharmacy and his local pharmacy, because we do not have samples of Entresto in this strength. Thanks

## 2020-07-28 ENCOUNTER — Telehealth: Payer: Self-pay | Admitting: Cardiology

## 2020-07-28 NOTE — Telephone Encounter (Signed)
Pt c/o medication issue:  1. Name of Medication: ENTRESTO 97-103 MG  2. How are you currently taking this medication (dosage and times per day)? As directed until today. He ran out  3. Are you having a reaction (difficulty breathing--STAT)? no  4. What is your medication issue? Patient is out of medication.   Patient states his mail order pharmacy can not get his medication to him until 7//7/22. He was also not able to get a short term supply from his insurance without paying close to $800.  He wanted to know what would happen if he didn't take that mediation for a week. Please advise

## 2020-07-28 NOTE — Telephone Encounter (Signed)
Returned call to pt and advised him that we don't carry samples of the Entresto 97-103 and to call his local pharmacy, [facility], to see if he can get 1 week supply until his mail order comes in the mail.  Pt verbalized understanding.

## 2020-07-28 NOTE — Telephone Encounter (Signed)
Follow Up:    Pt is calling back to see if we had samples of Entresto.  Patient calling the office for samples of medication:   1.  What medication and dosage are you requesting samples for?Entresto  2.  Are you currently out of this medication? yes

## 2020-07-28 NOTE — Telephone Encounter (Signed)
Spoke with pt and advised we have located one bottle of 49/51 mg tablets at our [facility] office.  They have the sample there at the front desk for him.  Pt is aware he will have to take 2 tablets twice a day.  He was grateful for the samples and the call back.

## 2020-09-21 ENCOUNTER — Other Ambulatory Visit: Payer: Self-pay | Admitting: Cardiology

## 2021-01-08 ENCOUNTER — Other Ambulatory Visit: Payer: Self-pay

## 2021-01-08 ENCOUNTER — Encounter: Payer: Self-pay | Admitting: Cardiology

## 2021-01-08 ENCOUNTER — Ambulatory Visit (INDEPENDENT_AMBULATORY_CARE_PROVIDER_SITE_OTHER): Payer: Medicare Other | Admitting: Cardiology

## 2021-01-08 VITALS — BP 112/70 | HR 73 | Ht 72.0 in | Wt 219.0 lb

## 2021-01-08 DIAGNOSIS — Z8673 Personal history of transient ischemic attack (TIA), and cerebral infarction without residual deficits: Secondary | ICD-10-CM | POA: Diagnosis not present

## 2021-01-08 DIAGNOSIS — E782 Mixed hyperlipidemia: Secondary | ICD-10-CM | POA: Insufficient documentation

## 2021-01-08 DIAGNOSIS — I2510 Atherosclerotic heart disease of native coronary artery without angina pectoris: Secondary | ICD-10-CM

## 2021-01-08 DIAGNOSIS — I428 Other cardiomyopathies: Secondary | ICD-10-CM

## 2021-01-08 DIAGNOSIS — I5022 Chronic systolic (congestive) heart failure: Secondary | ICD-10-CM | POA: Diagnosis not present

## 2021-01-08 DIAGNOSIS — Z79899 Other long term (current) drug therapy: Secondary | ICD-10-CM

## 2021-01-08 LAB — BASIC METABOLIC PANEL
BUN/Creatinine Ratio: 11 (ref 9–20)
BUN: 9 mg/dL (ref 6–24)
CO2: 23 mmol/L (ref 20–29)
Calcium: 9 mg/dL (ref 8.7–10.2)
Chloride: 106 mmol/L (ref 96–106)
Creatinine, Ser: 0.81 mg/dL (ref 0.76–1.27)
Glucose: 104 mg/dL — ABNORMAL HIGH (ref 70–99)
Potassium: 4.4 mmol/L (ref 3.5–5.2)
Sodium: 139 mmol/L (ref 134–144)
eGFR: 102 mL/min/{1.73_m2} (ref >59)

## 2021-01-08 NOTE — Assessment & Plan Note (Signed)
Nonobstructive coronary artery disease heart catheterization 2020.  Continue with aggressive treatment.  On atorvastatin 80 mg.  Excellent LDL of 37.  Aspirin 81 mg.  No anginal symptoms.

## 2021-01-08 NOTE — Assessment & Plan Note (Signed)
No recent symptoms.  Stable.  Goal-directed medical therapy.

## 2021-01-08 NOTE — Progress Notes (Signed)
Cardiology Office Note:    Date:  01/08/2021   ID:  Frank Solomon, DOB 03/17/1960, MRN [id number]  PCP:  Cipriano Mile, NP   [facility] Providers Cardiologist:  Candee Furbish, MD     Referring MD: Cipriano Mile, NP   History of Present Illness:    Frank Solomon is a 60 y.o. male is here for follow-up of nonischemic cardiomyopathy likely secondary to hypertension.  Nonobstructive CAD on catheterization 2020.  EF 40% on prior cardiac MRI.  Had trouble affording Entresto at 1 point.  This has been rectified.  He is compliant with all his medications.  Doing very well.  Overall seems to be doing quite well without any fevers chills nausea vomiting syncope bleeding.  Past Medical History:  Diagnosis Date   Cardiomyopathy, dilated [facility])    Diagnosed 1999 had biopsy and also VT   CHF (congestive heart failure) ([place])    TIA (transient ischemic attack)     Past Surgical History:  Procedure Laterality Date   CARDIAC CATHETERIZATION  1999   Dr Wynonia Lawman. per pt, was ok   LEFT HEART CATH AND CORONARY ANGIOGRAPHY N/A 12/14/2018   Procedure: LEFT HEART CATH AND CORONARY ANGIOGRAPHY;  Surgeon: Sherren Mocha, MD;  Location: [place] [facility];  Service: Cardiovascular;  Laterality: N/A;   VIDEO ASSISTED THORACOSCOPY (VATS)/DECORTICATION Right 09/07/2013   Procedure: VIDEO ASSISTED THORACOSCOPY (VATS)/ DRAINAGE OF EMPYEMA/ DECORTICATION;  Surgeon: Melrose Nakayama, MD;  Location: [facility];  Service: Thoracic;  Laterality: Right;    Current Medications: Current Meds  Medication Sig   amLODipine (NORVASC) 5 MG tablet Take 1 tablet (5 mg total) by mouth daily.   aspirin EC 81 MG tablet Take 1 tablet (81 mg total) by mouth daily. Swallow whole.   atorvastatin (LIPITOR) 80 MG tablet Take 1 tablet (80 mg total) by mouth daily.   carvedilol (COREG) 25 MG tablet Take 1 tablet (25 mg total) by mouth 2 (two) times daily.   ENTRESTO 97-103 MG Take 1 tablet by mouth 2 (two) times daily.   ibuprofen  (ADVIL) 600 MG tablet Take 1 tablet (600 mg total) by mouth every 6 (six) hours as needed.   metFORMIN (GLUCOPHAGE) 500 MG tablet Take 500 mg by mouth 2 (two) times daily with a meal.   nitroGLYCERIN (NITROSTAT) 0.4 MG SL tablet DISSOLVE 1 TABLET UNDER THE TONGUE EVERY 5 MINUTES AS  NEEDED FOR CHEST PAIN. MAX  OF 3 TABLETS IN 15 MINUTES. CALL 911 IF PAIN PERSISTS.   spironolactone (ALDACTONE) 25 MG tablet Take 1 tablet (25 mg total) by mouth daily.     Allergies:   Patient has no known allergies.   Social History   Socioeconomic History   Marital status: Divorced    Spouse name: Not on file   Number of children: Not on file   Years of education: Not on file   Highest education level: Not on file  Occupational History   Occupation: [employer], part-time  Tobacco Use   Smoking status: Some Days    Packs/day: 0.10    Types: Cigarettes   Smokeless tobacco: Never  Vaping Use   Vaping Use: Never used  Substance and Sexual Activity   Alcohol use: No   Drug use: No   Sexual activity: Not on file  Other Topics Concern   Not on file  Social History Narrative   Not on file   Social Determinants of Health   Financial Resource Strain: Not on file  Food Insecurity: Not on  file  Transportation Needs: Not on file  Physical Activity: Not on file  Stress: Not on file  Social Connections: Not on file     Family History: The patient's family history includes Dementia in his mother; Diabetes in his brother.  ROS:   Please see the history of present illness. All other systems reviewed and are negative.  EKGs/Labs/Other Studies Reviewed:    The following studies were reviewed today: Echo 02/10/2020 Impressions  1. Left ventricular ejection fraction, by estimation, is 40-45% The  left ventricle has mildly decreased function. The left ventricle  demonstrates global hypokinesis. The left ventricular internal cavity size  was mildly dilated. Left ventricular  diastolic parameters are  consistent with Grade I diastolic dysfunction  (impaired relaxation).   2. Right ventricular systolic function is normal. The right ventricular  size is normal.   3. Left atrial size was mildly dilated.   4. The mitral valve is normal in structure. No evidence of mitral valve  regurgitation. No evidence of mitral stenosis.   5. The aortic valve is normal in structure. Aortic valve regurgitation is  not visualized. No aortic stenosis is present.   6. The inferior vena cava is normal in size with greater than 50%  respiratory variability, suggesting right atrial pressure of 3 mmHg.   Left Heart Cath 12/14/2018    DG Chest Impressions 1. Chronic pleuroparenchymal scarring at the right lung base. No acute abnormality. 2. Borderline hyperinflation.   EKG: 01/08/2021-sinus rhythm 73 nonspecific ST-T wave changes poor R wave progression personally reviewed.   Recent Labs: 07/05/2020: ALT 40; BUN 12; Creatinine, Ser 0.99; Potassium 4.1; Sodium 140  Recent Lipid Panel    Component Value Date/Time   CHOL 100 07/05/2020 1018   TRIG 100 07/05/2020 1018   HDL 44 07/05/2020 1018   CHOLHDL 2.3 07/05/2020 1018   CHOLHDL 4.5 12/14/2018 0822   VLDL 13 12/14/2018 0822   LDLCALC 37 07/05/2020 1018     Risk Assessment/Calculations:      Physical Exam:    VS:  BP 112/70 (BP Location: Left Arm, Patient Position: Sitting, Cuff Size: Normal)   Pulse 73   Ht 6' (1.829 m)   Wt 219 lb (99.3 kg)   SpO2 98%   BMI 29.70 kg/m     Wt Readings from Last 3 Encounters:  01/08/21 219 lb (99.3 kg)  07/05/20 216 lb (98 kg)  01/13/20 224 lb 9.6 oz (101.9 kg)     GEN: Well nourished, well developed, in no acute distress HEENT: normal Neck: no JVD, carotid bruits, or masses Cardiac: RRR; no murmurs, rubs, or gallops,no edema  Respiratory:  clear to auscultation bilaterally, normal work of breathing GI: soft, nontender, nondistended, + BS MS: no deformity or atrophy Skin: warm and dry, no  rash Neuro:  Alert and Oriented x 3, Strength and sensation are intact Psych: euthymic mood, full affect   ASSESSMENT:    1. Chronic systolic heart failure ([place])   2. Coronary artery disease involving native coronary artery of native heart without angina pectoris   3. Medication management   4. History of TIA (transient ischemic attack)   5. Mixed hyperlipidemia   6. NICM (nonischemic cardiomyopathy) ([place])     PLAN:    In order of problems listed above:  NICM (nonischemic cardiomyopathy) (HCC) NYHA class I.  Doing very well.  Prior EF 40% on cardiac MRI.  Doing very well with goal-directed medical therapy, Entresto at high dose 97/103, on carvedilol 25 mg twice  a day, Aldactone 25 mg a day.  We will check a basic metabolic profile today.  Previously his creatinine was 0.9 potassium 4.1 ALT 40 hemoglobin A1c 6.3  Coronary artery disease involving native coronary artery of native heart without angina pectoris Nonobstructive coronary artery disease heart catheterization 2020.  Continue with aggressive treatment.  On atorvastatin 80 mg.  Excellent LDL of 37.  Aspirin 81 mg.  No anginal symptoms.  History of TIA (transient ischemic attack) No recent symptoms.  Stable.  Goal-directed medical therapy.  Mixed hyperlipidemia Excellent on atorvastatin 80 mg a day.  LDL 37 triglycerides 100.  No myalgias.      Medication Adjustments/Labs and Tests Ordered: Current medicines are reviewed at length with the patient today.  Concerns regarding medicines are outlined above.  Orders Placed This Encounter  Procedures   Basic metabolic panel   EKG 30-[facility]    No orders of the defined types were placed in this encounter.   Patient Instructions  Medication Instructions:  The current medical regimen is effective;  continue present plan and medications.  *If you need a refill on your cardiac medications before your next appointment, please call your pharmacy*  Lab Work: Please have blood  work today (BMP)  If you have labs (blood work) drawn today and your tests are completely normal, you will receive your results only by: MyChart Message (if you have MyChart) OR A paper copy in the mail If you have any lab test that is abnormal or we need to change your treatment, we will call you to review the results.  Follow-Up: At [facility], you and your health needs are our priority.  As part of our continuing mission to provide you with exceptional heart care, we have created designated Provider Care Teams.  These Care Teams include your primary Cardiologist (physician) and Advanced Practice Providers (APPs -  Physician Assistants and Nurse Practitioners) who all work together to provide you with the care you need, when you need it.  We recommend signing up for the patient portal called "MyChart".  Sign up information is provided on this After Visit Summary.  MyChart is used to connect with patients for Virtual Visits (Telemedicine).  Patients are able to view lab/test results, encounter notes, upcoming appointments, etc.  Non-urgent messages can be sent to your provider as well.   To learn more about what you can do with MyChart, go to [email or web address].    Your next appointment:   6 month(s)  The format for your next appointment:   In Person  Provider:   Melina Copa, PA-C, Cecilie Kicks, NP, Ermalinda Barrios, PA-C, Christen Bame, NP, or Richardson Dopp, PA-C     Then, Candee Furbish, MD will plan to see you again in 1 year(s).   Thank you for choosing [facility]!!        Signed, Candee Furbish, MD  01/08/2021 9:51 AM    [place]

## 2021-01-08 NOTE — Patient Instructions (Signed)
Medication Instructions:  The current medical regimen is effective;  continue present plan and medications.  *If you need a refill on your cardiac medications before your next appointment, please call your pharmacy*  Lab Work: Please have blood work today (BMP)  If you have labs (blood work) drawn today and your tests are completely normal, you will receive your results only by: MyChart Message (if you have MyChart) OR A paper copy in the mail If you have any lab test that is abnormal or we need to change your treatment, we will call you to review the results.  Follow-Up: At [facility], you and your health needs are our priority.  As part of our continuing mission to provide you with exceptional heart care, we have created designated Provider Care Teams.  These Care Teams include your primary Cardiologist (physician) and Advanced Practice Providers (APPs -  Physician Assistants and Nurse Practitioners) who all work together to provide you with the care you need, when you need it.  We recommend signing up for the patient portal called "MyChart".  Sign up information is provided on this After Visit Summary.  MyChart is used to connect with patients for Virtual Visits (Telemedicine).  Patients are able to view lab/test results, encounter notes, upcoming appointments, etc.  Non-urgent messages can be sent to your provider as well.   To learn more about what you can do with MyChart, go to [email or web address].    Your next appointment:   6 month(s)  The format for your next appointment:   In Person  Provider:   Melina Copa, PA-C, Cecilie Kicks, NP, Ermalinda Barrios, PA-C, Christen Bame, NP, or Richardson Dopp, PA-C     Then, Candee Furbish, MD will plan to see you again in 1 year(s).   Thank you for choosing [facility]!!

## 2021-01-08 NOTE — Assessment & Plan Note (Signed)
Excellent on atorvastatin 80 mg a day.  LDL 37 triglycerides 100.  No myalgias.

## 2021-01-08 NOTE — Assessment & Plan Note (Addendum)
NYHA class I.  Doing very well.  Prior EF 40% on cardiac MRI.  Doing very well with goal-directed medical therapy, Entresto at high dose 97/103, on carvedilol 25 mg twice a day, Aldactone 25 mg a day.  We will check a basic metabolic profile today.  Previously his creatinine was 0.9 potassium 4.1 ALT 40 hemoglobin A1c 6.3

## 2021-05-10 ENCOUNTER — Other Ambulatory Visit: Payer: Self-pay | Admitting: Cardiology

## 2021-05-19 ENCOUNTER — Other Ambulatory Visit: Payer: Self-pay | Admitting: Cardiology

## 2021-06-12 ENCOUNTER — Other Ambulatory Visit: Payer: Self-pay | Admitting: Cardiology

## 2021-08-09 ENCOUNTER — Other Ambulatory Visit: Payer: Self-pay | Admitting: Cardiology

## 2021-08-31 ENCOUNTER — Encounter: Payer: Self-pay | Admitting: Cardiology

## 2021-08-31 ENCOUNTER — Ambulatory Visit (INDEPENDENT_AMBULATORY_CARE_PROVIDER_SITE_OTHER): Payer: Medicare Other | Admitting: Cardiology

## 2021-08-31 VITALS — BP 118/78 | HR 73 | Ht 72.0 in | Wt 218.0 lb

## 2021-08-31 DIAGNOSIS — I428 Other cardiomyopathies: Secondary | ICD-10-CM | POA: Diagnosis not present

## 2021-08-31 DIAGNOSIS — Z8673 Personal history of transient ischemic attack (TIA), and cerebral infarction without residual deficits: Secondary | ICD-10-CM

## 2021-08-31 DIAGNOSIS — I2510 Atherosclerotic heart disease of native coronary artery without angina pectoris: Secondary | ICD-10-CM | POA: Diagnosis not present

## 2021-08-31 DIAGNOSIS — E782 Mixed hyperlipidemia: Secondary | ICD-10-CM

## 2021-08-31 DIAGNOSIS — E1369 Other specified diabetes mellitus with other specified complication: Secondary | ICD-10-CM

## 2021-08-31 DIAGNOSIS — I5022 Chronic systolic (congestive) heart failure: Secondary | ICD-10-CM

## 2021-08-31 DIAGNOSIS — E785 Hyperlipidemia, unspecified: Secondary | ICD-10-CM

## 2021-08-31 MED ORDER — ATORVASTATIN CALCIUM 40 MG PO TABS
40.0000 mg | ORAL_TABLET | Freq: Every day | ORAL | 3 refills | Status: DC
Start: 2021-08-31 — End: 2021-11-02

## 2021-08-31 NOTE — Patient Instructions (Signed)
Medication Instructions:  Your physician has recommended you make the following change in your medication:  Atorvastatin decreased to '40mg'$  daily  *If you need a refill on your cardiac medications before your next appointment, please call your pharmacy*   Lab Work: CBC, Cmet, TSH, AIC If you have labs (blood work) drawn today and your tests are completely normal, you will receive your results only by: [place] (if you have MyChart) OR A paper copy in the mail If you have any lab test that is abnormal or we need to change your treatment, we will call you to review the results.  Follow-Up: At [facility], you and your health needs are our priority.  As part of our continuing mission to provide you with exceptional heart care, we have created designated Provider Care Teams.  These Care Teams include your primary Cardiologist (physician) and Advanced Practice Providers (APPs -  Physician Assistants and Nurse Practitioners) who all work together to provide you with the care you need, when you need it.  We recommend signing up for the patient portal called "MyChart".  Sign up information is provided on this After Visit Summary.  MyChart is used to connect with patients for Virtual Visits (Telemedicine).  Patients are able to view lab/test results, encounter notes, upcoming appointments, etc.  Non-urgent messages can be sent to your provider as well.   To learn more about what you can do with MyChart, go to [email or web address].    Your next appointment:   1 year(s)  The format for your next appointment:   In Person  Provider:   Candee Furbish, MD {

## 2021-08-31 NOTE — Progress Notes (Signed)
Cardiology Office Note:    Date:  08/31/2021   ID:  Frank Solomon, DOB 08-Feb-1960, MRN [id number]  PCP:  Cipriano Mile, NP   [facility] Providers Cardiologist:  Candee Furbish, MD     Referring MD: Cipriano Mile, NP    History of Present Illness:    Frank Solomon is a 61 y.o. male here for follow-up of nonischemic cardiomyopathy, hypertension.  Diagnosed with cardiomyopathy in 1999 had biopsy, had ventricular tachycardia at the time. Also had TIA in the past.  Repeat cardiac catheterization on 12/14/2018 was performed as well-this showed minimal nonobstructive CAD 20 to 25% stenosis.  Seems to be tolerating the medications quite well.  Slight drop in energy.   Past Medical History:  Diagnosis Date   Cardiomyopathy, dilated [facility])    Diagnosed 1999 had biopsy and also VT   CHF (congestive heart failure) ([place])    TIA (transient ischemic attack)     Past Surgical History:  Procedure Laterality Date   CARDIAC CATHETERIZATION  1999   Dr Wynonia Lawman. per pt, was ok   LEFT HEART CATH AND CORONARY ANGIOGRAPHY N/A 12/14/2018   Procedure: LEFT HEART CATH AND CORONARY ANGIOGRAPHY;  Surgeon: Sherren Mocha, MD;  Location: [facility] CV LAB;  Service: Cardiovascular;  Laterality: N/A;   VIDEO ASSISTED THORACOSCOPY (VATS)/DECORTICATION Right 09/07/2013   Procedure: VIDEO ASSISTED THORACOSCOPY (VATS)/ DRAINAGE OF EMPYEMA/ DECORTICATION;  Surgeon: Melrose Nakayama, MD;  Location: [place];  Service: Thoracic;  Laterality: Right;    Current Medications: Current Meds  Medication Sig   amLODipine (NORVASC) 5 MG tablet TAKE 1 TABLET BY MOUTH  DAILY   aspirin EC 81 MG tablet Take 1 tablet (81 mg total) by mouth daily. Swallow whole.   atorvastatin (LIPITOR) 40 MG tablet Take 1 tablet (40 mg total) by mouth daily.   carvedilol (COREG) 25 MG tablet Take 1 tablet (25 mg total) by mouth 2 (two) times daily.   ENTRESTO 97-103 MG Take 1 tablet by mouth 2 (two) times daily. Please keep upcoming  appointment for future refills. Thank you   ibuprofen (ADVIL) 600 MG tablet Take 1 tablet (600 mg total) by mouth every 6 (six) hours as needed.   metFORMIN (GLUCOPHAGE) 500 MG tablet Take 500 mg by mouth 2 (two) times daily with a meal.   nitroGLYCERIN (NITROSTAT) 0.4 MG SL tablet DISSOLVE 1 TABLET UNDER THE TONGUE EVERY 5 MINUTES AS  NEEDED FOR CHEST PAIN. MAX  OF 3 TABLETS IN 15 MINUTES. CALL 911 IF PAIN PERSISTS. Please call [phone] to schedule an appointment for future refills. Thank you 1st attempt.   [DISCONTINUED] atorvastatin (LIPITOR) 80 MG tablet TAKE 1 TABLET BY MOUTH  DAILY     Allergies:   Patient has no known allergies.   Social History   Socioeconomic History   Marital status: Divorced    Spouse name: Not on file   Number of children: Not on file   Years of education: Not on file   Highest education level: Not on file  Occupational History   Occupation: [employer], part-time  Tobacco Use   Smoking status: Some Days    Packs/day: 0.10    Types: Cigarettes   Smokeless tobacco: Never  Vaping Use   Vaping Use: Never used  Substance and Sexual Activity   Alcohol use: No   Drug use: No   Sexual activity: Not on file  Other Topics Concern   Not on file  Social History Narrative   Not on file   Social  Determinants of Health   Financial Resource Strain: Not on file  Food Insecurity: Not on file  Transportation Needs: Not on file  Physical Activity: Not on file  Stress: Not on file  Social Connections: Not on file     Family History: The patient's family history includes Dementia in his mother; Diabetes in his brother.  ROS:   Please see the history of present illness.    Mild fatigue all other systems reviewed and are negative.  EKGs/Labs/Other Studies Reviewed:    The following studies were reviewed today: Echo 02/10/2020 Impressions  1. Left ventricular ejection fraction, by estimation, is 40 to 45%. The  left ventricle has mildly decreased function.  The left ventricle  demonstrates global hypokinesis. The left ventricular internal cavity size  was mildly dilated. Left ventricular  diastolic parameters are consistent with Grade I diastolic dysfunction  (impaired relaxation).   2. Right ventricular systolic function is normal. The right ventricular  size is normal.   3. Left atrial size was mildly dilated.   4. The mitral valve is normal in structure. No evidence of mitral valve  regurgitation. No evidence of mitral stenosis.   5. The aortic valve is normal in structure. Aortic valve regurgitation is  not visualized. No aortic stenosis is present.   6. The inferior vena cava is normal in size with greater than 50%  respiratory variability, suggesting right atrial pressure of 3 mmHg.    Left Heart Cath 12/14/2018      DG Chest Impressions 1. Chronic pleuroparenchymal scarring at the right lung base. No acute abnormality. 2. Borderline hyperinflation.    EKG:  EKG is  ordered today.  The ekg ordered today demonstrates sinus rhythm 73 incomplete left bundle branch block nonspecific T wave changes.  No changes.  Recent Labs: 01/08/2021: BUN 9; Creatinine, Ser 0.81; Potassium 4.4; Sodium 139  Recent Lipid Panel    Component Value Date/Time   CHOL 100 07/05/2020 1018   TRIG 100 07/05/2020 1018   HDL 44 07/05/2020 1018   CHOLHDL 2.3 07/05/2020 1018   CHOLHDL 4.5 12/14/2018 0822   VLDL 13 12/14/2018 0822   LDLCALC 37 07/05/2020 1018     Risk Assessment/Calculations:              Physical Exam:    VS:  BP 118/78   Pulse 73   Ht 6' (1.829 m)   Wt 218 lb (98.9 kg)   SpO2 99%   BMI 29.57 kg/m     Wt Readings from Last 3 Encounters:  08/31/21 218 lb (98.9 kg)  01/08/21 219 lb (99.3 kg)  07/05/20 216 lb (98 kg)     GEN:  Well nourished, well developed in no acute distress HEENT: Normal NECK: No JVD; No carotid bruits LYMPHATICS: No lymphadenopathy CARDIAC: RRR, no murmurs, no rubs, gallops RESPIRATORY:   Clear to auscultation without rales, wheezing or rhonchi  ABDOMEN: Soft, non-tender, non-distended MUSCULOSKELETAL:  No edema; No deformity  SKIN: Warm and dry NEUROLOGIC:  Alert and oriented x 3 PSYCHIATRIC:  Normal affect   ASSESSMENT:    1. Chronic systolic heart failure ([place])   2. History of TIA (transient ischemic attack)   3. NICM (nonischemic cardiomyopathy) ([place])   4. Coronary artery disease involving native coronary artery of native heart without angina pectoris   5. Mixed hyperlipidemia   6. 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  Creatinine 0.8 potassium 4.4 TSH 0.6 previously.  We will go ahead and check lab work today.   Nonobstructive CAD Cardiac catheterization reviewed.  Minimal disease 20 to 25%.  Last LDL was 37 on atorvastatin 80.  I will decrease this to 40 mg a day.  This should still keep him below 70 on LDL.   Prior history of TIA No recent strokelike symptoms.   Diabetes with hyperlipidemia Continue with atorvastatin 80 mg.  LDL 37.  Overall excellent.  I think we can bring back his atorvastatin to 40 mg a day.  Fatigue Could be deconditioning.  Continue to encourage exercise.  I will check blood work including TSH today.  CBC.       Medication Adjustments/Labs and Tests Ordered: Current medicines are reviewed at length with the patient today.  Concerns regarding medicines are outlined above.  Orders Placed This Encounter  Procedures   Hemoglobin A1c   Comprehensive metabolic panel   CBC   TSH   EKG 12-Lead   Meds ordered this encounter  Medications   atorvastatin (LIPITOR) 40 MG tablet    Sig: Take 1 tablet (40 mg total) by mouth daily.    Dispense:  90 tablet    Refill:  3    Patient Instructions   Medication Instructions:  Your physician has recommended you make the following change in your medication:  Atorvastatin decreased to '40mg'$  daily  *If you need a refill on your cardiac medications before your next appointment, please call your pharmacy*   Lab Work: CBC, Cmet, TSH, AIC If you have labs (blood work) drawn today and your tests are completely normal, you will receive your results only by: [place] (if you have MyChart) OR A paper copy in the mail If you have any lab test that is abnormal or we need to change your treatment, we will call you to review the results.  Follow-Up: At [facility], you and your health needs are our priority.  As part of our continuing mission to provide you with exceptional heart care, we have created designated Provider Care Teams.  These Care Teams include your primary Cardiologist (physician) and Advanced Practice Providers (APPs -  Physician Assistants and Nurse Practitioners) who all work together to provide you with the care you need, when you need it.  We recommend signing up for the patient portal called "MyChart".  Sign up information is provided on this After Visit Summary.  MyChart is used to connect with patients for Virtual Visits (Telemedicine).  Patients are able to view lab/test results, encounter notes, upcoming appointments, etc.  Non-urgent messages can be sent to your provider as well.   To learn more about what you can do with MyChart, go to [email or web address].    Your next appointment:   1 year(s)  The format for your next appointment:   In Person  Provider:   Candee Furbish, MD {          Signed, Candee Furbish, MD  08/31/2021 9:04 AM    [address]

## 2021-09-01 LAB — CBC
Hematocrit: 38 % (ref 37.5–51.0)
Hemoglobin: 13.3 g/dL (ref 13.0–17.7)
MCH: 33.7 pg — ABNORMAL HIGH (ref 26.6–33.0)
MCHC: 35 g/dL (ref 31.5–35.7)
MCV: 96 fL (ref 79–97)
Platelets: 231 10*3/uL (ref 150–450)
RBC: 3.95 x10E6/uL — ABNORMAL LOW (ref 4.14–5.80)
RDW: 12.9 % (ref 11.6–15.4)
WBC: 6.9 10*3/uL (ref 3.4–10.8)

## 2021-09-01 LAB — COMPREHENSIVE METABOLIC PANEL
ALT: 25 IU/L (ref 0–44)
AST: 32 IU/L (ref 0–40)
Albumin/Globulin Ratio: 1.8 (ref 1.2–2.2)
Albumin: 4.2 g/dL (ref 3.8–4.9)
Alkaline Phosphatase: 90 IU/L (ref 44–121)
BUN/Creatinine Ratio: 13 (ref 10–24)
BUN: 12 mg/dL (ref 8–27)
Bilirubin Total: 0.7 mg/dL (ref 0.0–1.2)
CO2: 22 mmol/L (ref 20–29)
Calcium: 9.1 mg/dL (ref 8.6–10.2)
Chloride: 108 mmol/L — ABNORMAL HIGH (ref 96–106)
Creatinine, Ser: 0.92 mg/dL (ref 0.76–1.27)
Globulin, Total: 2.3 g/dL (ref 1.5–4.5)
Glucose: 111 mg/dL — ABNORMAL HIGH (ref 70–99)
Potassium: 4.2 mmol/L (ref 3.5–5.2)
Sodium: 142 mmol/L (ref 134–144)
Total Protein: 6.5 g/dL (ref 6.0–8.5)
eGFR: 95 mL/min/{1.73_m2} (ref >59)

## 2021-09-01 LAB — TSH: TSH: 0.785 u[IU]/mL (ref 0.450–4.500)

## 2021-09-01 LAB — HEMOGLOBIN A1C
Est. average glucose Bld gHb Est-mCnc: 140 mg/dL
Hgb A1c MFr Bld: 6.5 % — ABNORMAL HIGH (ref 4.8–5.6)

## 2021-09-15 ENCOUNTER — Other Ambulatory Visit: Payer: Self-pay | Admitting: Cardiology

## 2021-11-01 ENCOUNTER — Other Ambulatory Visit: Payer: Self-pay | Admitting: Cardiology

## 2021-11-02 ENCOUNTER — Other Ambulatory Visit: Payer: Self-pay

## 2021-11-11 ENCOUNTER — Other Ambulatory Visit: Payer: Self-pay | Admitting: Cardiology

## 2021-11-21 ENCOUNTER — Other Ambulatory Visit: Payer: Self-pay

## 2021-11-21 MED ORDER — CARVEDILOL 25 MG PO TABS: 25.0000 mg | ORAL_TABLET | Freq: Two times a day (BID) | ORAL | 3 refills | Status: DC

## 2021-11-21 MED ORDER — SPIRONOLACTONE 25 MG PO TABS: 25.0000 mg | ORAL_TABLET | Freq: Every day | ORAL | 3 refills | Status: DC

## 2021-11-21 NOTE — Telephone Encounter (Signed)
Pt's medications were sent to pt's pharmacy as requested. Confirmation received.  

## 2022-02-28 ENCOUNTER — Other Ambulatory Visit: Payer: Self-pay | Admitting: Cardiology

## 2022-06-10 ENCOUNTER — Other Ambulatory Visit: Payer: Self-pay | Admitting: Student

## 2022-06-10 DIAGNOSIS — F17210 Nicotine dependence, cigarettes, uncomplicated: Secondary | ICD-10-CM

## 2022-07-10 ENCOUNTER — Ambulatory Visit
Admission: RE | Admit: 2022-07-10 | Discharge: 2022-07-10 | Disposition: A | Discharge status: Home / Self Care | Payer: 59 | Source: Ambulatory Visit | Attending: Student | Admitting: Student

## 2022-07-10 DIAGNOSIS — F17210 Nicotine dependence, cigarettes, uncomplicated: Secondary | ICD-10-CM

## 2022-07-11 ENCOUNTER — Other Ambulatory Visit: Payer: Self-pay | Admitting: Cardiology

## 2022-08-09 ENCOUNTER — Other Ambulatory Visit: Payer: Self-pay | Admitting: Cardiology

## 2022-09-06 ENCOUNTER — Other Ambulatory Visit: Payer: Self-pay | Admitting: Cardiology

## 2022-10-14 ENCOUNTER — Other Ambulatory Visit: Payer: Self-pay | Admitting: Cardiology

## 2022-10-16 ENCOUNTER — Other Ambulatory Visit: Payer: Self-pay | Admitting: Cardiology

## 2022-10-17 ENCOUNTER — Other Ambulatory Visit: Payer: Self-pay | Admitting: Cardiology

## 2022-11-13 ENCOUNTER — Other Ambulatory Visit: Payer: Self-pay | Admitting: Cardiology

## 2022-11-25 ENCOUNTER — Other Ambulatory Visit: Payer: Self-pay | Admitting: Cardiology

## 2022-11-26 ENCOUNTER — Other Ambulatory Visit: Payer: Self-pay | Admitting: Cardiology

## 2022-12-03 ENCOUNTER — Ambulatory Visit: Payer: 59 | Attending: Cardiology | Admitting: Cardiology

## 2022-12-03 ENCOUNTER — Encounter: Payer: Self-pay | Admitting: Cardiology

## 2022-12-03 VITALS — BP 122/80 | HR 74 | Ht 72.0 in | Wt 216.6 lb

## 2022-12-03 DIAGNOSIS — E782 Mixed hyperlipidemia: Secondary | ICD-10-CM

## 2022-12-03 DIAGNOSIS — I5022 Chronic systolic (congestive) heart failure: Secondary | ICD-10-CM

## 2022-12-03 DIAGNOSIS — Z09 Encounter for follow-up examination after completed treatment for conditions other than malignant neoplasm: Secondary | ICD-10-CM | POA: Diagnosis not present

## 2022-12-03 DIAGNOSIS — Z79899 Other long term (current) drug therapy: Secondary | ICD-10-CM

## 2022-12-03 NOTE — Progress Notes (Signed)
  Cardiology Office Note:  .   Date:  12/03/2022  ID:  Frank Solomon, DOB [birth year], MRN [id number] PCP: Hillery Aldo, NP  [facility] Providers Cardiologist:  Donato Schultz, MD     History of Present Illness: .   Frank Solomon is a 62 y.o. male Discussed with the use of AI scribe software  History of Present Illness   The 62 year old patient with a history of non-ischemic cardiomyopathy, prior TIA, and minimal nonobstructive coronary artery disease (CAD) as per cardiac catheterization in November 2020, presents for a routine follow-up. He reports no new or worsening symptoms, specifically denying any chest pain or shortness of breath. The patient's last echocardiogram in January 2022 showed a mildly to moderately reduced ejection fraction of 40-45%, compared to the normal range of 55-60%.  The patient has been managing his diabetes and hyperlipidemia effectively, with an LDL of 37 in 2022. His current medication regimen includes atorvastatin 80mg , spironolactone 25mg , Entresto 97-103mg  twice daily, carvedilol 25mg  twice daily, aspirin 81mg , and amlodipine 5mg . The patient has been adhering to this regimen and reports no issues.  There is no recent history of stroke-like symptoms. The patient has not had any recent blood work done. He has been maintaining an active lifestyle and a healthy diet, with a focus on fruits and vegetables.          ROS: No CP, no SOB  Studies Reviewed: Marland Kitchen   EKG Interpretation Date/Time:  Tuesday December 03 2022 13:46:59 EST Ventricular Rate:  74 PR Interval:  184 QRS Duration:  102 QT Interval:  384 QTC Calculation: 426 R Axis:   61  Text Interpretation: Normal sinus rhythm Poor R wave progression Anterior infarct , age undetermined When compared with ECG of 14-Dec-2018 06:02, No significant change since Confirmed by Donato Schultz ([id number]) on 12/03/2022 2:05:51 PM    Results LABS LDL: 37 (2022)  DIAGNOSTIC Cardiac catheterization: Minimal  nonobstructive disease, 20% stenosis (12/14/2018) Echocardiogram: Ejection fraction 40-45%, mildly to moderately reduced systolic function (02/10/2020)  Risk Assessment/Calculations:            Physical Exam:   VS:  BP 122/80   Pulse 74   Ht 6' (1.829 m)   Wt 216 lb 9.6 oz (98.2 kg)   SpO2 96%   BMI 29.38 kg/m    Wt Readings from Last 3 Encounters:  12/03/22 216 lb 9.6 oz (98.2 kg)  08/31/21 218 lb (98.9 kg)  01/08/21 219 lb (99.3 kg)    GEN: Well nourished, well developed in no acute distress NECK: No JVD; No carotid bruits CARDIAC: RRR, no murmurs, no rubs, no gallops RESPIRATORY:  Clear to auscultation without rales, wheezing or rhonchi  ABDOMEN: Soft, non-tender, non-distended EXTREMITIES:  No edema; No deformity   ASSESSMENT AND PLAN: .    Assessment and Plan    Non-ischemic Cardiomyopathy Stable with no new symptoms. Echocardiogram on 02/09/2022 showed mildly to moderately reduced ejection fraction (40-45%). Patient is on optimal medical therapy with Spironolactone 25mg , Entresto 97-103mg  BID, and Carvedilol 25mg  BID. -Continue current medications. -Order labs including CMP and CBC.  Hyperlipidemia Well controlled on Atorvastatin 80mg  with LDL of 37 in 2022. -Continue Atorvastatin 80mg . -Order lipid panel.  Diabetes Mellitus No new issues discussed. -Continue current management.  General Health Maintenance / Followup Plans -Return for follow-up in 1 year. -Order labs today including CMP, CBC, and lipid panel.               Signed, Donato Schultz, MD

## 2022-12-03 NOTE — Patient Instructions (Signed)
Medication Instructions:  The current medical regimen is effective;  continue present plan and medications.  *If you need a refill on your cardiac medications before your next appointment, please call your pharmacy*   Lab Work: Please have blood work today (CBC, CMP and Lipid)  If you have labs (blood work) drawn today and your tests are completely normal, you will receive your results only by: MyChart Message (if you have MyChart) OR A paper copy in the mail If you have any lab test that is abnormal or we need to change your treatment, we will call you to review the results.   Follow-Up: At [facility], you and your health needs are our priority.  As part of our continuing mission to provide you with exceptional heart care, we have created designated Provider Care Teams.  These Care Teams include your primary Cardiologist (physician) and Advanced Practice Providers (APPs -  Physician Assistants and Nurse Practitioners) who all work together to provide you with the care you need, when you need it.  We recommend signing up for the patient portal called "MyChart".  Sign up information is provided on this After Visit Summary.  MyChart is used to connect with patients for Virtual Visits (Telemedicine).  Patients are able to view lab/test results, encounter notes, upcoming appointments, etc.  Non-urgent messages can be sent to your provider as well.   To learn more about what you can do with MyChart, go to [email or web address].    Your next appointment:   1 year(s)  Provider:   Candee Furbish, MD

## 2022-12-04 LAB — CBC
Hematocrit: 44.3 % (ref 37.5–51.0)
Hemoglobin: 14.4 g/dL (ref 13.0–17.7)
MCH: 33.9 pg — ABNORMAL HIGH (ref 26.6–33.0)
MCHC: 32.5 g/dL (ref 31.5–35.7)
MCV: 104 fL — ABNORMAL HIGH (ref 79–97)
Platelets: 227 10*3/uL (ref 150–450)
RBC: 4.25 x10E6/uL (ref 4.14–5.80)
RDW: 13.4 % (ref 11.6–15.4)
WBC: 7.6 10*3/uL (ref 3.4–10.8)

## 2022-12-04 LAB — COMPREHENSIVE METABOLIC PANEL
ALT: 34 [IU]/L (ref 0–44)
AST: 36 [IU]/L (ref 0–40)
Albumin: 4.2 g/dL (ref 3.9–4.9)
Alkaline Phosphatase: 127 [IU]/L — ABNORMAL HIGH (ref 44–121)
BUN/Creatinine Ratio: 11 (ref 10–24)
BUN: 11 mg/dL (ref 8–27)
Bilirubin Total: 0.7 mg/dL (ref 0.0–1.2)
CO2: 23 mmol/L (ref 20–29)
Calcium: 9.1 mg/dL (ref 8.6–10.2)
Chloride: 106 mmol/L (ref 96–106)
Creatinine, Ser: 0.98 mg/dL (ref 0.76–1.27)
Globulin, Total: 2.6 g/dL (ref 1.5–4.5)
Glucose: 81 mg/dL (ref 70–99)
Potassium: 4.3 mmol/L (ref 3.5–5.2)
Sodium: 143 mmol/L (ref 134–144)
Total Protein: 6.8 g/dL (ref 6.0–8.5)
eGFR: 88 mL/min/{1.73_m2} (ref >59)

## 2022-12-04 LAB — LIPID PANEL
Chol/HDL Ratio: 2.2 ratio (ref 0.0–5.0)
Cholesterol, Total: 119 mg/dL (ref 100–199)
HDL: 54 mg/dL (ref >39)
LDL Chol Calc (NIH): 53 mg/dL (ref 0–99)
Triglycerides: 52 mg/dL (ref 0–149)
VLDL Cholesterol Cal: 12 mg/dL (ref 5–40)

## 2022-12-12 ENCOUNTER — Other Ambulatory Visit: Payer: Self-pay | Admitting: Cardiology

## 2022-12-18 ENCOUNTER — Other Ambulatory Visit: Payer: Self-pay | Admitting: Cardiology

## 2022-12-28 ENCOUNTER — Other Ambulatory Visit: Payer: Self-pay | Admitting: Cardiology

## 2023-01-11 ENCOUNTER — Other Ambulatory Visit: Payer: Self-pay | Admitting: Cardiology

## 2023-02-13 ENCOUNTER — Other Ambulatory Visit: Payer: Self-pay | Admitting: Cardiology

## 2023-07-31 ENCOUNTER — Other Ambulatory Visit: Payer: Self-pay | Admitting: Cardiology

## 2023-09-06 ENCOUNTER — Other Ambulatory Visit: Payer: Self-pay | Admitting: Cardiology

## 2024-01-10 ENCOUNTER — Other Ambulatory Visit: Payer: Self-pay | Admitting: Cardiology

## 2024-02-25 ENCOUNTER — Other Ambulatory Visit: Payer: Self-pay | Admitting: Cardiology
